# Patient Record
Sex: Female | Born: 1965
Health system: Southern US, Community
[De-identification: ages and names within clinical notes are randomized; demographics above are authoritative.]

## PROBLEM LIST (undated history)

## (undated) DIAGNOSIS — R0602 Shortness of breath: Secondary | ICD-10-CM

## (undated) DIAGNOSIS — M2550 Pain in unspecified joint: Secondary | ICD-10-CM

## (undated) DIAGNOSIS — E039 Hypothyroidism, unspecified: Secondary | ICD-10-CM

## (undated) DIAGNOSIS — R0600 Dyspnea, unspecified: Secondary | ICD-10-CM

## (undated) DIAGNOSIS — M549 Dorsalgia, unspecified: Secondary | ICD-10-CM

## (undated) DIAGNOSIS — G43909 Migraine, unspecified, not intractable, without status migrainosus: Secondary | ICD-10-CM

## (undated) DIAGNOSIS — K5900 Constipation, unspecified: Secondary | ICD-10-CM

## (undated) DIAGNOSIS — R600 Localized edema: Secondary | ICD-10-CM

## (undated) DIAGNOSIS — R002 Palpitations: Secondary | ICD-10-CM

## (undated) DIAGNOSIS — R5383 Other fatigue: Secondary | ICD-10-CM

## (undated) DIAGNOSIS — D649 Anemia, unspecified: Secondary | ICD-10-CM

## (undated) DIAGNOSIS — R609 Edema, unspecified: Secondary | ICD-10-CM

## (undated) DIAGNOSIS — R000 Tachycardia, unspecified: Secondary | ICD-10-CM

## (undated) DIAGNOSIS — E785 Hyperlipidemia, unspecified: Secondary | ICD-10-CM

## (undated) DIAGNOSIS — F32A Depression, unspecified: Secondary | ICD-10-CM

## (undated) DIAGNOSIS — K589 Irritable bowel syndrome without diarrhea: Secondary | ICD-10-CM

## (undated) DIAGNOSIS — J45909 Unspecified asthma, uncomplicated: Secondary | ICD-10-CM

## (undated) DIAGNOSIS — E559 Vitamin D deficiency, unspecified: Secondary | ICD-10-CM

## (undated) DIAGNOSIS — E538 Deficiency of other specified B group vitamins: Secondary | ICD-10-CM

## (undated) DIAGNOSIS — F329 Major depressive disorder, single episode, unspecified: Secondary | ICD-10-CM

## (undated) DIAGNOSIS — E079 Disorder of thyroid, unspecified: Secondary | ICD-10-CM

## (undated) HISTORY — PX: CARPAL TUNNEL RELEASE: SHX101

## (undated) HISTORY — DX: Edema, unspecified: R60.9

## (undated) HISTORY — DX: Dorsalgia, unspecified: M54.9

## (undated) HISTORY — DX: Shortness of breath: R06.02

## (undated) HISTORY — DX: Unspecified asthma, uncomplicated: J45.909

## (undated) HISTORY — DX: Vitamin D deficiency, unspecified: E55.9

## (undated) HISTORY — DX: Migraine, unspecified, not intractable, without status migrainosus: G43.909

## (undated) HISTORY — PX: APPENDECTOMY: SHX54

## (undated) HISTORY — DX: Other fatigue: R53.83

## (undated) HISTORY — DX: Pain in unspecified joint: M25.50

## (undated) HISTORY — DX: Hypothyroidism, unspecified: E03.9

## (undated) HISTORY — DX: Tachycardia, unspecified: R00.0

## (undated) HISTORY — DX: Constipation, unspecified: K59.00

## (undated) HISTORY — DX: Anemia, unspecified: D64.9

## (undated) HISTORY — DX: Disorder of thyroid, unspecified: E07.9

## (undated) HISTORY — DX: Dyspnea, unspecified: R06.00

## (undated) HISTORY — DX: Deficiency of other specified B group vitamins: E53.8

## (undated) HISTORY — DX: Irritable bowel syndrome, unspecified: K58.9

## (undated) HISTORY — DX: Depression, unspecified: F32.A

## (undated) HISTORY — DX: Localized edema: R60.0

## (undated) HISTORY — PX: OTHER SURGICAL HISTORY: SHX169

## (undated) HISTORY — DX: Palpitations: R00.2

## (undated) HISTORY — DX: Hyperlipidemia, unspecified: E78.5

---
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
## 1997-12-28 ENCOUNTER — Other Ambulatory Visit: Admission: RE | Admit: 1997-12-28 | Discharge: 1997-12-28 | Payer: Self-pay | Admitting: *Deleted

## 1999-02-13 ENCOUNTER — Encounter: Admission: RE | Admit: 1999-02-13 | Discharge: 1999-05-14 | Payer: Self-pay | Admitting: Family Medicine

## 1999-11-12 ENCOUNTER — Encounter: Payer: Self-pay | Admitting: *Deleted

## 1999-11-12 ENCOUNTER — Ambulatory Visit (HOSPITAL_COMMUNITY): Admission: RE | Admit: 1999-11-12 | Discharge: 1999-11-12 | Payer: Self-pay | Admitting: *Deleted

## 1999-11-28 ENCOUNTER — Ambulatory Visit (HOSPITAL_COMMUNITY): Admission: RE | Admit: 1999-11-28 | Discharge: 1999-11-28 | Payer: Self-pay | Admitting: Gastroenterology

## 1999-12-17 ENCOUNTER — Other Ambulatory Visit: Admission: RE | Admit: 1999-12-17 | Discharge: 1999-12-17 | Payer: Self-pay | Admitting: *Deleted

## 2000-01-09 ENCOUNTER — Encounter: Payer: Self-pay | Admitting: Gastroenterology

## 2000-01-09 ENCOUNTER — Ambulatory Visit (HOSPITAL_COMMUNITY): Admission: RE | Admit: 2000-01-09 | Discharge: 2000-01-09 | Payer: Self-pay | Admitting: Gastroenterology

## 2000-01-15 ENCOUNTER — Encounter (INDEPENDENT_AMBULATORY_CARE_PROVIDER_SITE_OTHER): Payer: Self-pay | Admitting: Specialist

## 2000-01-16 ENCOUNTER — Inpatient Hospital Stay (HOSPITAL_COMMUNITY): Admission: EM | Admit: 2000-01-16 | Discharge: 2000-01-21 | Payer: Self-pay | Admitting: Emergency Medicine

## 2000-01-16 ENCOUNTER — Encounter: Payer: Self-pay | Admitting: Emergency Medicine

## 2000-01-17 ENCOUNTER — Encounter: Payer: Self-pay | Admitting: Family Medicine

## 2000-10-19 ENCOUNTER — Encounter: Payer: Self-pay | Admitting: Emergency Medicine

## 2000-10-19 ENCOUNTER — Emergency Department (HOSPITAL_COMMUNITY): Admission: EM | Admit: 2000-10-19 | Discharge: 2000-10-19 | Payer: Self-pay | Admitting: Emergency Medicine

## 2000-12-21 ENCOUNTER — Other Ambulatory Visit: Admission: RE | Admit: 2000-12-21 | Discharge: 2000-12-21 | Payer: Self-pay | Admitting: *Deleted

## 2002-01-05 ENCOUNTER — Other Ambulatory Visit: Admission: RE | Admit: 2002-01-05 | Discharge: 2002-01-05 | Payer: Self-pay | Admitting: *Deleted

## 2003-03-08 ENCOUNTER — Other Ambulatory Visit: Admission: RE | Admit: 2003-03-08 | Discharge: 2003-03-08 | Payer: Self-pay | Admitting: *Deleted

## 2003-05-04 ENCOUNTER — Encounter: Admission: RE | Admit: 2003-05-04 | Discharge: 2003-05-04 | Payer: Self-pay | Admitting: Gastroenterology

## 2004-03-14 ENCOUNTER — Other Ambulatory Visit: Admission: RE | Admit: 2004-03-14 | Discharge: 2004-03-14 | Payer: Self-pay | Admitting: Obstetrics & Gynecology

## 2005-04-07 ENCOUNTER — Other Ambulatory Visit: Admission: RE | Admit: 2005-04-07 | Discharge: 2005-04-07 | Payer: Self-pay | Admitting: Obstetrics & Gynecology

## 2005-07-17 ENCOUNTER — Ambulatory Visit (HOSPITAL_COMMUNITY): Admission: RE | Admit: 2005-07-17 | Discharge: 2005-07-17 | Payer: Self-pay | Admitting: Obstetrics & Gynecology

## 2006-06-19 ENCOUNTER — Ambulatory Visit (HOSPITAL_COMMUNITY): Admission: EM | Admit: 2006-06-19 | Discharge: 2006-06-20 | Payer: Self-pay | Admitting: Emergency Medicine

## 2006-06-19 ENCOUNTER — Encounter (INDEPENDENT_AMBULATORY_CARE_PROVIDER_SITE_OTHER): Payer: Self-pay | Admitting: Specialist

## 2007-05-20 ENCOUNTER — Encounter: Admission: RE | Admit: 2007-05-20 | Discharge: 2007-05-20 | Payer: Self-pay | Admitting: Obstetrics & Gynecology

## 2009-10-29 ENCOUNTER — Encounter: Admission: RE | Admit: 2009-10-29 | Discharge: 2009-10-29 | Payer: Self-pay | Admitting: Family Medicine

## 2010-11-01 NOTE — Procedures (Signed)
[facility]  Patient:    Alexandria Ward, Alexandria Ward                          MRN: [id number] Proc. Date: 11/28/99 Adm. Date:  [id number] Disc. Date: [id number] Attending:  Orland Mustard CC:         Elana Alm. Eliezer Lofts., M.D.                           Procedure Report  PROCEDURE PERFORMED:  Esophagogastroduodenscopy.  ENDOSCOPIST:  Llana Aliment. Edwards, M.D.  MEDICATIONS:  Hurricaine spray, fentanyl 50 mcg, Versed 5 mg IV.  INDICATIONS:  Epigastric pain, abnormal upper GI suggesting gastric outlet obstruction.  DESCRIPTION OF PROCEDURE:  The procedure had been explained to the patient and consent obtained.  With the patient in the left lateral decubitus position, the Olympus video endoscope was inserted blindly in the esophagus and advanced under direct visualization.  The stomach was entered, the pylorus identified and passed.  There was virtually no evidence whatsoever of any pyloric stenosis or outlet obstructions.  The duodenum was entered.  We went to the full extent of the scope and what was felt to be the second and third duodenum with no strictures, ulcers or any other obstruction.  On passage of the scope, the duodenum appeared completely normal to the C-loop.  The duodenal bulb was completely free of any ulcerations or inflammation as was the pyloric channel. The antrum and body were seen well and were normal.  The fundus and cardia seen on the retroflex view and were normal.  There was a hiatal hernia with a patent GE junction.  The distal esophagus was somewhat reddened.  The proximal esophagus was normal.  The scope was withdrawn.  The patient tolerated the procedure without difficulty being maintained on Datascope monitor and low-flow oxygen throughout.  ASSESSMENT: 1. Hiatal hernia with gastroesophageal reflux disease.  PLAN:  Give the patient a trial of Reglan and see back in the office in four to six weeks.  She will remain on the  Prevacid.  PLAN: DD:  11/28/99 TD:  12/02/99 Job: [id number]

## 2010-11-01 NOTE — Op Note (Signed)
Alexandria Ward, Alexandria Ward                 ACCOUNT NO.:  [id number]   MEDICAL RECORD NO.:  [id number]          PATIENT TYPE:  INP   LOCATION:  6010                         FACILITY:  [facility]   PHYSICIAN:  Lebron Conners, M.D.   DATE OF BIRTH:  [birth year]   DATE OF PROCEDURE:  06/19/2006  DATE OF DISCHARGE:                               OPERATIVE REPORT   PREOPERATIVE DIAGNOSIS:  Acute appendicitis.   POSTOPERATIVE DIAGNOSIS:  Acute appendicitis.   OPERATION:  Laparoscopic appendectomy.   SURGEON:  Dr. Lebron Conners.   ANESTHESIA:  General.   BLOOD LOSS:  Minimal.   COMPLICATIONS:  No complications.   CONDITION:  To PACU good.   SPECIMEN:  Appendix sent to lab.   PROCEDURE:  After the patient was monitored and asleep and had a Foley  catheter and routine preparation and draping of the abdomen, I  infiltrated the area just below the umbilicus with long-acting local  anesthetic and then made a 2 cm vertical incision, dissected down  through scar and fat to the midline fascia and incised it for 2 cm in  the midline and bluntly entered the peritoneal cavity.  I found no  adhesions of the viscera to the umbilical area.  I placed a #0 Vicryl  pursestring suture and secured a Hassan cannula and inflated the abdomen  with CO2.  I could see an inflamed appendix.  I placed a 5 mm right  upper quadrant port and a 11-mm left lower quadrant port under direct  view noting that no viscera were injured.  I elevated the appendix and  took down adhesions and then dissected between the appendix and the  mesentery.  I divided the appendix and the mesentery with separate  firings of the endovascular cutting stapler.  There was a little  bleeding from the mesentery which I controlled with cautery and sucked  away the blood, irrigated it and sucked it away again.  I placed the  appendix in a plastic pouch and removed it through the umbilical  incision and tied the pursestring suture.  I again inspected  the  operative area and found no persistent fluid and no persist bleeding.  Sponge, needle and instrument counts were correct.  I removed the right  upper quadrant port under direct view and I saw no bleeding from the  belly wall.  After allowing the carbon dioxide to escape, I removed the  lower abdominal port and closed all skin incisions with intracuticular 4-  0 Vicryl and Steri-Strips.  She tolerated the operation well.      Lebron Conners, M.D.  Electronically Signed     WB/MEDQ  D:  06/19/2006  T:  06/20/2006  Job:  [id number]

## 2010-11-01 NOTE — Procedures (Signed)
[facility]  Patient:    Alexandria Ward, Alexandria Ward                          MRN: [id number] Proc. Date: 01/20/00 Adm. Date:  [id number] Attending:  Anastasio Auerbach CC:         Elana Alm. Eliezer Lofts., M.D.                           Procedure Report  PROCEDURE PERFORMED:  Colonoscopy and biopsy.  ENDOSCOPIST:  Llana Aliment. Randa Evens, M.D.  MEDICATIONS USED:  Fentanyl 75 mcg, Versed 6 mg IV.  INSTRUMENT:  Pediatric Olympus video colonoscope.  INDICATIONS:  Hypotension, bloating, abnormal CT suggesting possible Crohns disease.  DESCRIPTION OF PROCEDURE:  The procedure had been explained to the patient and consent obtained.  With the patient in the left lateral decubitus position, the pediatric video colonoscope was inserted and advanced under direct visualization.  Immediately upon entering the colon, it was seen to be edematous and somewhat friable.  We advanced easily to the cecum.  The terminal ileum was entered.  There appeared to be a few areas in the terminal ileum that could have been ulcerations similar to linear ulcerations.  These were biopsied.  The right colon was endoscopically normal, was biopsied, placed in jar #2.  The transverse colon and descending colon were endoscopically normal, but were biopsied and placed in jar #3.  Roughly at approximately 40 to 50 cm the friability started and this was felt to be in the sigmoid colon.  This area was biopsied from there down to the rectum and placed in jar #4.  The scope was withdrawn.  The patient tolerated the procedure well.  Maintained on low flow oxygen and pulse oximeter throughout the procedure.  ASSESSMENT: 1. Possible Crohns colitis and ileitis.  If so, very mild.  Cannot attribute    all of her symptoms to this.  It is also possible that this could be a    resolving infectious colitis.  PLAN:  Will check pathology.  Start on Asacol and low residue diet and follow clinically. DD:  01/20/00 TD:   01/21/00 Job: [id number]

## 2010-11-01 NOTE — Op Note (Signed)
NAME:  Alexandria Ward, Alexandria Ward                 ACCOUNT NO.:  [id number]   MEDICAL RECORD NO.:  [id number]          PATIENT TYPE:  AMB   LOCATION:  SDC                           FACILITY:  [facility]   PHYSICIAN:  Freddy Finner, M.D.   DATE OF BIRTH:  April 17, 1966   DATE OF PROCEDURE:  07/17/2005  DATE OF DISCHARGE:                                 OPERATIVE REPORT   PREOPERATIVE DIAGNOSES:  1.  Chronic pelvic pain.  2.  Severe dysmenorrhea.  3.  Menorrhagia.  4.  Suspected pelvic endometriosis.   POSTOPERATIVE DIAGNOSES:  1.  No evidence of pelvic endometriosis.  2.  Irregular enlargement of the uterus consistent with fibroids and      adenomyosis.  3.  Normal tubes and ovaries, normal pelvic peritoneum.  4.  No apparent abnormalities in the upper abdomen, including a normal      appendix.   OPERATIVE PROCEDURE:  1.  Laparoscopy.  2.  Uterosacral nerve ablation.  3.  Placement of Marina IUD.   INTRAOPERATIVE COMPLICATIONS:  None.   ESTIMATED INTRAOPERATIVE BLOOD LOSS:  Less than 5 mL.   ANESTHESIA:  General.   The patient is a 45 year old with clinical history as noted above, who was  admitted on the morning of surgery.  She was given an antibiotic that was  __________ preoperatively.  She was brought to the operating room and placed  under adequate general anesthesia, placed in the dorsolithotomy position  using the [place] stirrup system.  Betadine prep of abdomen, perineum, and  vagina was carried out in the usual fashion.  Bladder was evacuated with a  Robinson catheter.  Hulka tenaculum was attached to the cervix; uterus and  cervix sounded to 10 cm.  Two small incisions were made in the abdomen after  application of sterile drapes.  One was at the umbilicus and one just above  the symphysis.  Then in the upper incision , an 11 mm disposable trocar was  introduced while elevating the anterior abdominal wall.  Some difficulty was  encountered penetrating the peritoneum, but this was  accomplished without  evidence of intra-abdominal trauma.  Pneumoperitoneum was allowed to  accumulate with carbon dioxide gas.  A 5 mm trocar was placed in the lower  incision under direct visualization.  __________ probe was used for traction  during the procedure.  A systematic examination of pelvic and abdominal  contents was carried out.  Findings are essentially noted as postoperative  diagnoses.  Findings were also recorded in still photographs.  The  uterosacral ligaments were ablated at their junction with the cervix using  the bipolar Kleppinger forceps.  Gas was then allowed to escape from the  abdomen.  Instruments were removed.  The skin incisions were closed with  interrupted subcuticular sutures of 3-0 Dexon.  Marcaine 0.5% was injected  into the incision sites for postoperative analgesia.  The patient was also  given Toradol 30 mg IM for postoperative analgesia.  Hulka tenaculum was  removed; cervix was visualized, grasped with a single-tooth tenaculum.  The  Jearld Adjutant IUD was placed under standard procedure  without difficulty.  The procedure was terminated.  The instruments were  removed.  The patient was awakened and taken to the recovery room in good  condition.  She will be given routine outpatient surgical instructions.  She  has Vicodin to be taken as needed for postoperative pain.      Freddy Finner, M.D.  Electronically Signed     WRN/MEDQ  D:  07/17/2005  T:  07/17/2005  Job:  [id number]

## 2010-11-01 NOTE — H&P (Signed)
[facility]  Patient:    Alexandria Ward                           MRN: [id number] Adm. Date:  [id number] Attending:  Anastasio Auerbach                         History and Physical  DATE OF BIRTH:  1965/11/09  ADMISSION DIAGNOSIS:  Hypertension, tachycardia, unknown etiology, presumed viral syndrome, mild abdominal pain.  CHIEF COMPLAINT:  The patient complains of generalized weakness.  HISTORY OF PRESENT ILLNESS:  The patient is a 45 year old female who comes in with a several week history of some abdominal discomfort.  She is currently being worked up by Dr. Carman Ching.  She has had upper endoscopy and recently has gone through gastric-emptying scan which she does not know the results of. Today, she felt somewhat woozy and general fatigue and malaise when she went to work today.  She had several episodes of diarrhea.  She did go home and rested.  However, she continues to feel off balance when she is up and around and some mild discomfort of her abdomen.  She was put on Prevacid 30 mg twice a day in her GI workup.  She had been using some Reglan, but she stopped it about a week ago prior to her gastric emptying scan.  She did take one dose of Reglan recently.  She described some bloating feeling.  She has been hot and cold.  She states she had excessive chills today at work.  Since being brought to [facility] Emergency Room her blood pressure has fluctuated with systolic being 70 to 98, diastolic 40 to 60.  When up and walking, she feels dizzy and off balance.  She denies any headache, cough, sore throat or generalized achiness.  She denies any actual vomiting and she has no other previous medical problems to speak of.  The patient is running a fever here in the emergency room of 101.5.  PAST MEDICAL HISTORY:  No hospitalizations.  Surgeries:  She had a laparoscopy to rule out endometriosis which was ruled out by Dr. Malachy Mood.  Her menstrual cycles have  been normal.  She had a complete pelvic exam in July already this year.  ALLERGIES:  Allergic to TRIMOX causing itching.  CURRENT MEDICATIONS: 1. Prevacid 30 mg b.i.d. 2. Imipramine 25 mg two p.o. q.h.s. 3. Reglan as needed with meals, but she is off for the past week. 4. She takes Tylenol or Aleve as needed.  FAMILY HISTORY:  Noone with coronary artery disease.  Maternal aunt with hypertension.  Maternal aunt with thyroid problems.  Another aunt with breast cancer.  Maternal grandmother with colon cancer, but doing well at age 3. Fibrocystic breast disease in mother and aunt.  One of them had a breast removed, not cancerous however.  Great maternal grandmother had colon cancer and diabetes mellitus in maternal grandmother.  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  She is single, no boyfriend, no sexual activity.  No pets. No exercise at the present time.  She does not smoke.  No alcohol use.  She travels to [place] to see her family and attends [employer] there.  REVIEW OF SYSTEMS:  She complains of generalized weakness over the last few weeks.  Some nausea for the past day.  She states she has  problems with bloating, feeling quite chilled today, denies sore throat, cough or any dyspnea.  PHYSICAL EXAMINATION:  GENERAL:  Alert female in no acute distress.  VITAL SIGNS:  Blood pressure 80/50, pulse 120 to 140, respirations 20, temperature 101.5.  HEENT:  TMs are normal.  Throat is clear.  Pupils are equal, round and reactive to light.  Extraocular muscles intact bilaterally.  Funduscopic exam is normal.  NECK:  Supple without lymphadenopathy or thyromegaly.  NEUROLOGIC:  Cranial nerves II-XI are intact.  LUNGS:  Clear to auscultation with no rales or rhonchi.  HEART:  Elevated rate, but normal rhythm.  No rubs, murmurs or gallops.  ABDOMEN:  Mildly distended, but she does have bowel sounds in all  four quadrants, although they are hyperactive and high pitched.  Diffuse tenderness on palpation, but no localization of the pain.  No hepatosplenomegaly.  PELVIC/RECTAL:  Exam deferred.  Recent female exam.  EXTREMITIES:  No peripheral edema.  Sensory is intact.  LABORATORY DATA:  Initially hemoglobin 11.9, hematocrit 35, white count 10.2 with a 288,000 platelet.  Her differential shows 79 segmented neutrophils, 11 lymphocytes, 8 monocytes, but no eosinophils or basophils.  After 2.5 liters of fluid her hemoglobin dropped to 10.2, hematocrit 30.  Her electrolytes on arrival were totally normal.  Lipase of 8, calcium 8.6.  EKG:  Shows sinus tachycardia.  No evidence of ischemia.  She did have a flat plate of the abdomen.  ASSESSMENT/PLAN:  Hypotension in setting of dizziness and fever, mild diarrhea today.  Currently unknown etiology.  No previous history of heart disease. Questionable whether it is neurally mediated hypotension.  Hopefully this is just due to a virus.  She is undergoing a GI workup for the pain.  Dr. Randa Evens consult as needed.  We will follow patients H&H.  Consider cardiac workup. Will admit her fluids, observation and to follow the fever curve.  I will presume viral at this time, but this will need to be followed.  [facility] will take over care first thing in the morning. DD:  01/16/00 TD:  01/16/00 Job: [id number]

## 2010-11-01 NOTE — Discharge Summary (Signed)
[place]  Patient:    Alexandria Ward, Alexandria Ward                          MRN: [id number] Adm. Date:  [id number] Disc. Date: [id number] Attending:  Anastasio Auerbach CC:         Elana Alm. Eliezer Lofts., M.D.             _____             Llana Aliment. Randa Evens, M.D.                           Discharge Summary  DATE OF BIRTH:  06-Nov-1965  CONSULTATIONS:  Dr. Vilinda Boehringer.  PROCEDURES:  Colonoscopy with biopsy.  DISCHARGE DIAGNOSES: 1. Colitis, nonspecific, biopsies pending. 2. Persistent orthostatic hypotension, adrenal insufficiency workup in    progress. 3. History of laparoscopy to rule out endometriosis by Dr. Malachy Mood. 4. History of allergy to TRIMOX (pleuritis).  DISCHARGE MEDICATIONS: 1. Prevacid 30 mg p.o. b.i.d. 2. Imipramine 25 mg q.h.s. p.r.n. 3. Asacol 400 mg 2 p.o. t.i.d.  FOLLOW-UP:  The patient is scheduled with Dr. Elias Else on Tuesday, January 28, 2000, at 12 noon.  She is to call Dr. Randa Evens office at [phone] to schedule follow-up and to obtain results of pending biopsies.  HISTORY OF PRESENT ILLNESS:  The patient is a 44 year old woman who has had several weeks of abdominal discomfort, under outpatient evaluation by Dr. Vilinda Boehringer.  She had undergone an upper endoscopy with nonspecific findings, as well as a gastric emptying scan which showed mild delay.  Laboratory workup had been unrevealing including LFTs, CBC, and TSH.  There had been no improvement on Reglan.  On the day of admission, the patient had felt lightheaded, experiencing presyncopal symptoms and, on evaluation, was found to be hypotensive.  Ms. Luellen has suffered for some time from generalized fatigue.  She is frustrated with her lack of energy, which she said has been chronic and progressive.  She has also noted eyelid puffiness, and her weight has fluctuated over the previous year.  She had been experiencing diarrhea in the preceding weeks as well as some poorly localized  discomfort of the abdomen. On the day of admission, she had experienced chills.  She had denied headache, cough, sore throat, myalgias, and arthralgias.  Physical exam in the emergency room revealed temperature 101.5, systolic pressures between 70 and 98, with diastolics between 40 and 60.  Pulse elevated between 120 and 140s and tachycardia.  Respirations were normal, as was oxygen saturation.  Physical exam as performed by Dr. Merri Brunette revealed no specific abnormalities. Admission laboratories included hemoglobin 11.9, white count 10.2, platelets 288,000.  Follow-up hemoglobin after hydration was 10.2.  Lipase was normal, as was calcium.  EKG showed sinus tachycardia.  Ms. Egelston was admitted with marked orthostatic hypotension and abdominal symptoms with fever, suspicious for viral syndrome.  HOSPITAL COURSE: #1 - COLITIS:  Ms. Viviano was placed empirically on Reglan and proton pump inhibitors, neither of which seemed to make much difference in her symptoms. She underwent colonoscopy on January 20, 2000, which revealed some inflammatory changes of the colon in a pattern not characteristic of her particular etiology, but with differential including Crohns disease versus infection. Pathology is pending at this time, and Ms. Blaszczyk was begun on Asacol and a low-residue diet.  She will follow up with Dr. Randa Evens.  At the  end of her hospitalization she was experiencing minimal symptoms with regard to her gastrointestinal complaints.  Stool cultures negative for pathogens.  C. difficile toxin negative.  #2 - PERSISTENT HYPOTENSION:  Mrs. Brabender was initially suspected to be suffering from hypovolemia, given her recent history of diarrhea.  She was aggressively rehydrated with initial improvement in her blood pressure.  She also experienced improvement in her pulse, but remained orthostatic and somewhat hypotensive for the remainder of her admission, although no longer symptomatic.   Screening tests included normal TSH, a.m. cortisol level in the low-normal range at 8.5, and negative urine pregnancy.  Urinalysis was unremarkable, and a specific gravity was curiously 1.009, suggesting possible inability to concentrate.  The day prior to admission, she underwent a cosyntropin skin test to rule out adrenal insufficiency.  She has experienced no hypoglycemia, and menstrual periods have been regular.  Monitoring of fluid intake and output revealed no evidence of diabetes insipidus.  While it is possible her hypotension is secondary to a viral syndrome, given the chronicity of her symptoms, it is important to rule out adrenal insufficiency.  These tests will need to be followed up at office appointment next week, at which time orthostatic blood pressure and pulse should also be repeated. DD:  01/21/00 TD:  01/22/00 Job: [id number]

## 2010-11-01 NOTE — Consult Note (Signed)
[facility]  Patient:    Alexandria Ward                           MRN: [id number] Proc. Date: 01/16/00 Adm. Date:  [id number] Attending:  Anastasio Auerbach CC:         Elana Alm. Eliezer Lofts., M.D.   Consultation Report  HISTORY:  A nice 45 year old woman who has had vague upper GI symptoms.  She had bloating and had an abnormal upper GI suggesting delayed drainage of the stomach without clear outlet obstruction.  She had been on Prevacid 30 mg b.i.d. without improvement.  We saw her for these symptoms.  Labs were obtained revealing TSH that was normal as well as completely normal liver tests and CBC.  Her workup to date has included an endoscopy which showed hiatal hernia with possible reflux and we started her on Reglan; this really did not help her.  She has continued to bloat.  She had been chronically constipated and we gave her some Miralax.  She was due to see me back in the office in one month.  We did go ahead and obtain a gastric emptying scan last week off of Reglan.  It did show slight delay in gastric emptying, with 68% of material remaining at one hour, normal less than 50%.  The patient felt woozy, felt like she was going to pass out fairly suddenly today and was admitted during the night to the hospital.  She is not feeling any better.  She presented to the emergency room with systolic pressures in the 70s and felt dizzy.  In addition to this, she continued to have a fever and gave a history of a temperature of 101.  MEDICATIONS ON ADMISSION 1. Prevacid 30 mg b.i.d. 2. Reglan 10 mg a.c. and h.s. 3. Tylenol. 4. Imipramine 25 mg q.h.s. p.r.n. for sleep.  ALLERGIES:  She is allergic to PENICILLIN.  MEDICAL HISTORY 1. Endoscopy, gastric emptying scan and upper GI, all as noted above. 2. Questionable history of IBS. 3. History of chronic yeast infections. 4. History of previous laparoscopy.  FAMILY HISTORY:  Unchanged.  PHYSICAL  EXAMINATION  VITAL SIGNS:  Temperature 97.5, blood pressure 86/55.  GENERAL:  A nonicteric white female in no acute distress.  LUNGS:  Clear.  HEART:  Regular rate and rhythm, without murmurs or gallops.  ABDOMEN:  Slightly distended but generally is soft with normal bowel sounds. There is mild tenderness in the lower abdomen but no guarding, rigidity or rebound.  RECTAL:  Not repeated.  Stool was checked in the office and was Hemoccult negative.  LABORATORY DATA:  UA is negative for wbcs or rbcs, does have ketones. Hemoglobin 10.2, with normal BUN and creatinine.  Also notable, gallbladder ultrasound obtained today revealed no evidence of gallstones.  ASSESSMENT:  Persistent nausea and fever of unclear etiology.  This patient may well have some sort of autoimmune disease of some sort.  I think we probably need to go ahead and look at her abdomen, since this seems to be the source of her symptoms.  At this point, she has had a negative endoscopy, gastric emptying scan and has had negative gallbladder ultrasound.  I think a CT would be the next appropriate step.  PLAN:  We will go ahead and obtain a CT of the abdomen with contrast to look at her pancreas and other organs.  Patient notes to me that she is not sexually active  and there is no chance she could be pregnant. DD:  01/16/00 TD:  01/17/00 Job: [id number]

## 2011-02-24 IMAGING — CT CT ABD-PELV W/O CM
2 of 4 series · 17 of 46 positions shown, 19 images · non-contrast
Comparison: None

CLINICAL DATA: Bladder and pelvic pain.  Urinary frequency and
urgency.  Elevated creatinine.

CT ABDOMEN AND PELVIS WITHOUT CONTRAST
TECHNIQUE: Multidetector CT imaging of the abdomen and pelvis was
performed following the standard protocol without intravenous
contrast.

[Series 2: wo · axial · 0.68mm/px · z∈[+540,+960]mm · 14 of 92 slices shown, 16 images]
[im 4/92  soft-tissue]
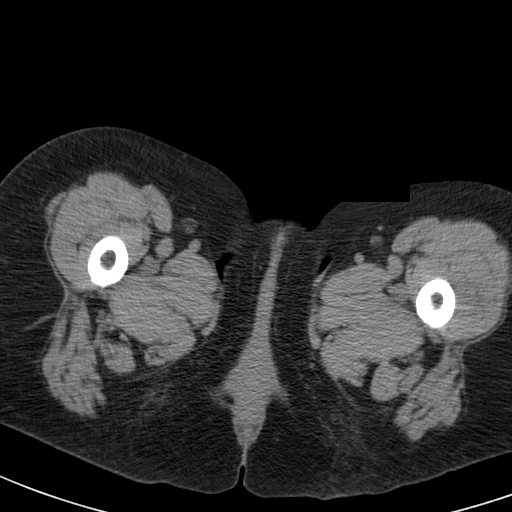
[im 4/92  bone]
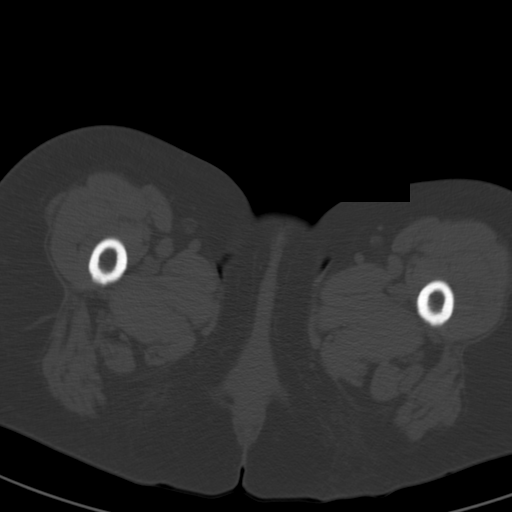
[im 11/92  soft-tissue]
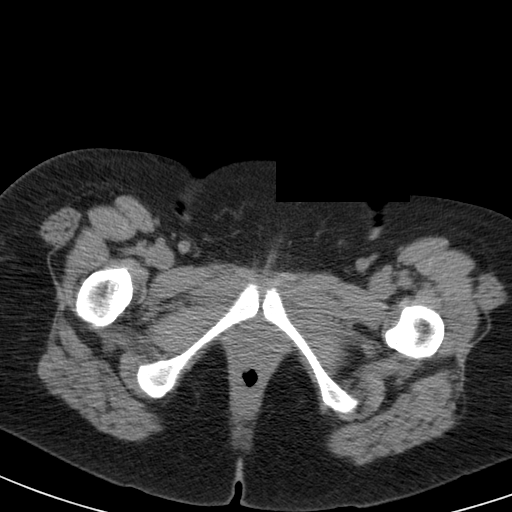
[im 19/92  soft-tissue]
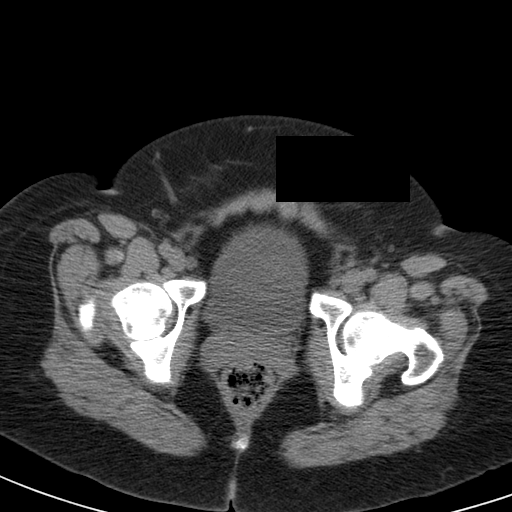
[im 26/92  soft-tissue]
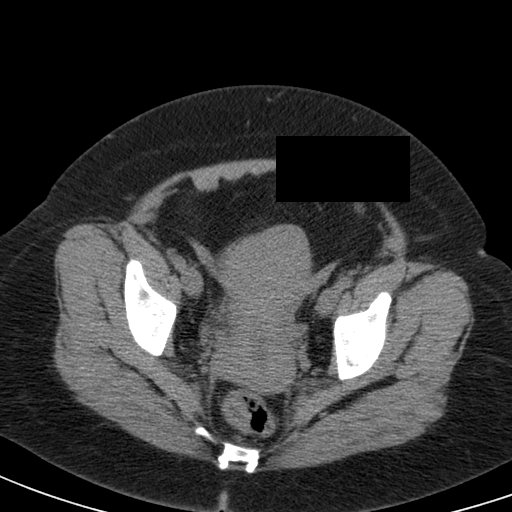
[im 30/92  soft-tissue]
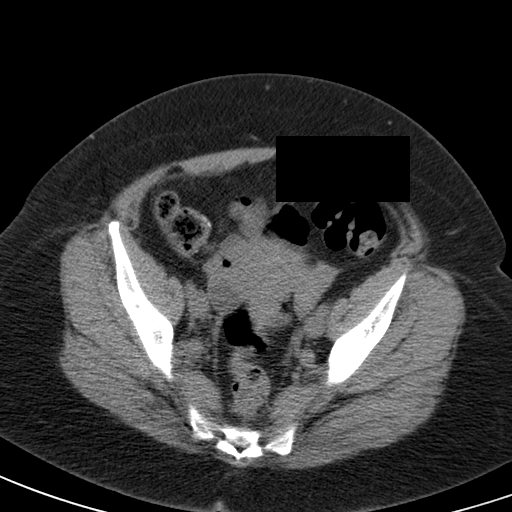
[im 37/92  soft-tissue]
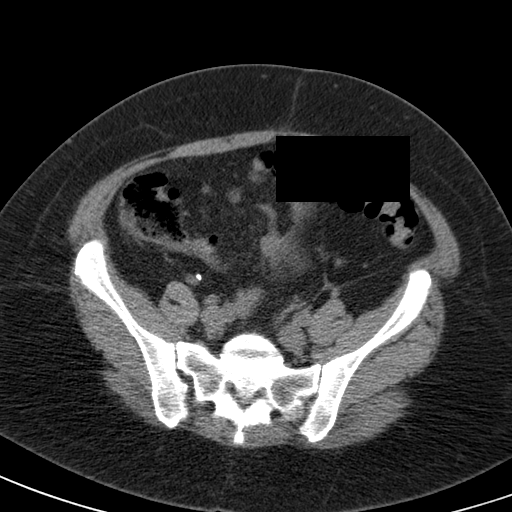
[im 44/92  soft-tissue]
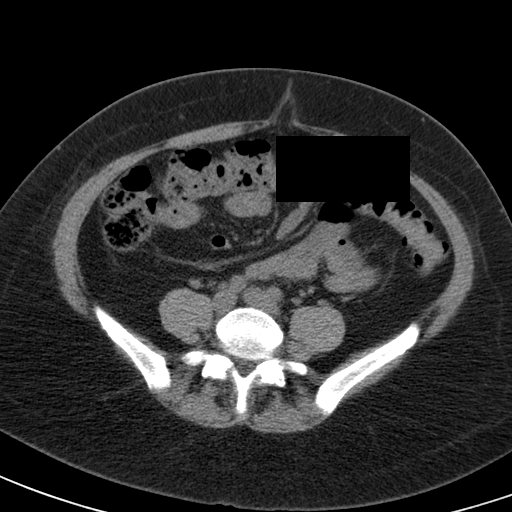
[im 48/92  soft-tissue]
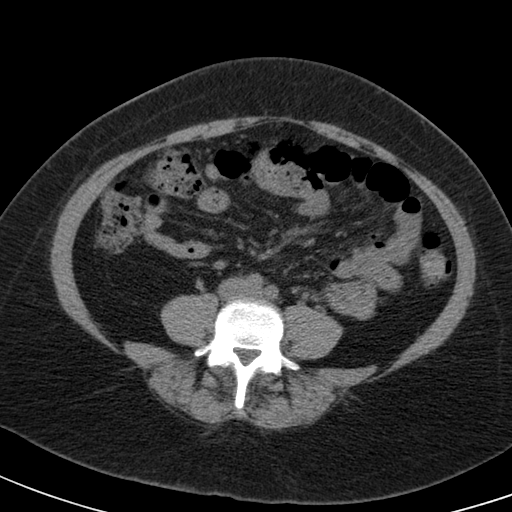
[im 55/92  soft-tissue]
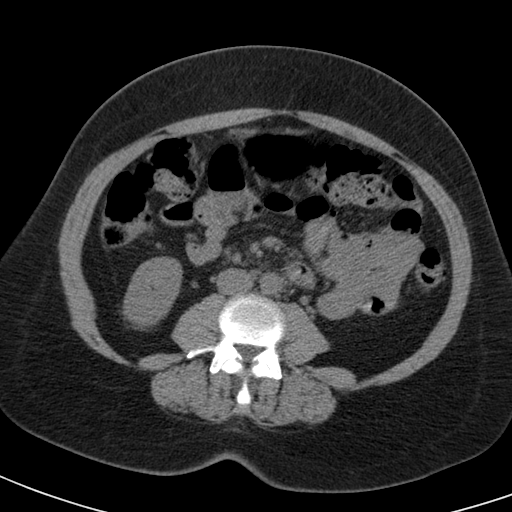
[im 55/92  bone]
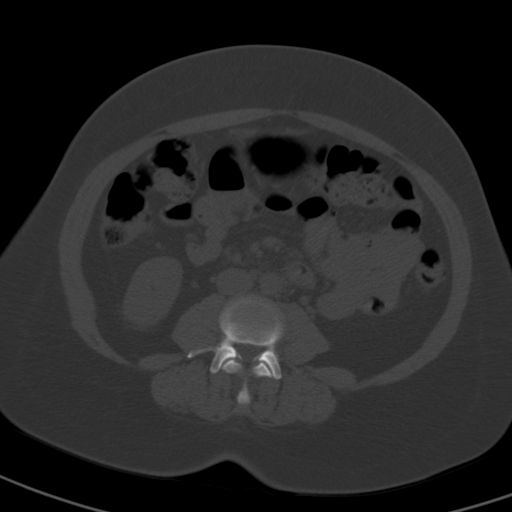
[im 62/92  soft-tissue]
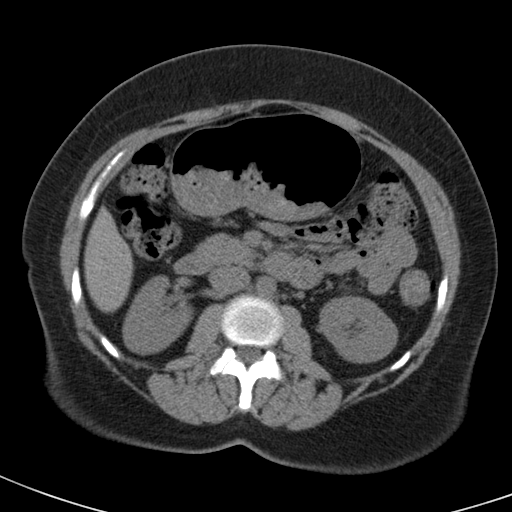
[im 70/92  soft-tissue]
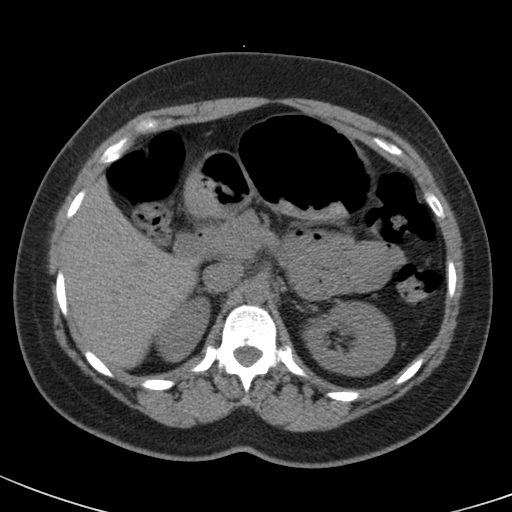
[im 73/92  soft-tissue]
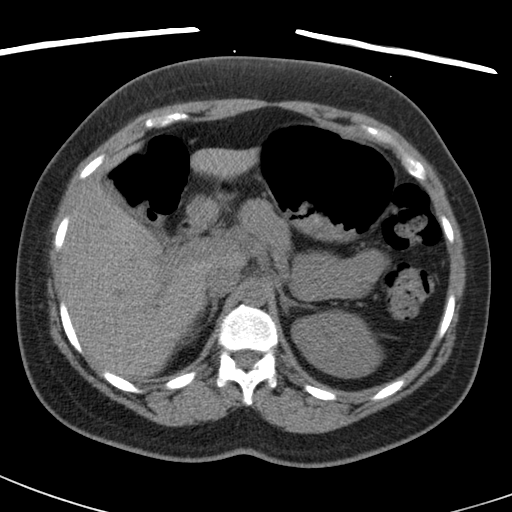
[im 81/92  soft-tissue]
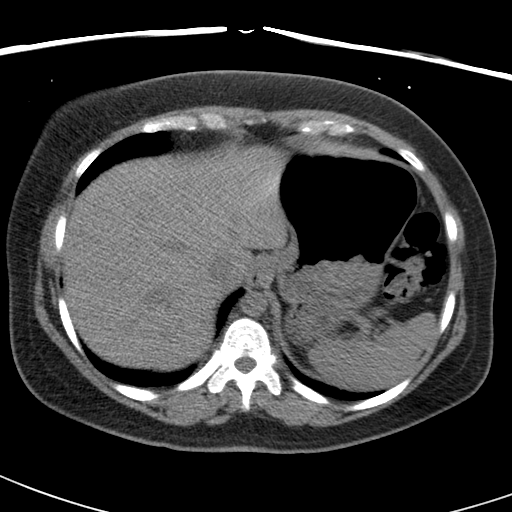
[im 88/92  soft-tissue]
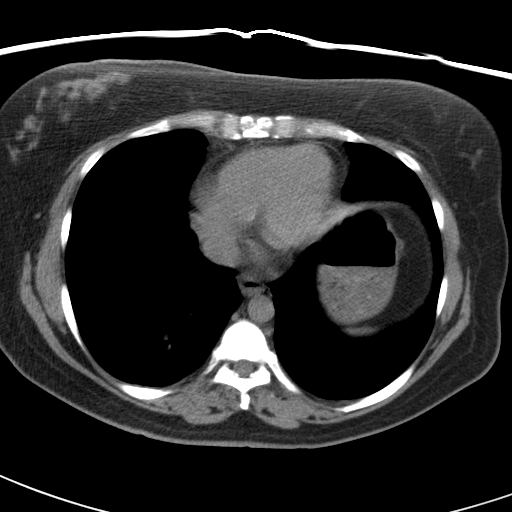

[coronals · coronal · 0.89mm/px · 3 of 89 slices shown]
[im 30/89  soft-tissue]
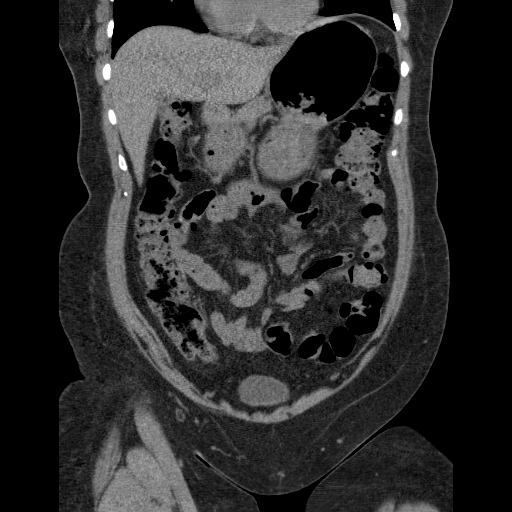
[im 40/89  soft-tissue]
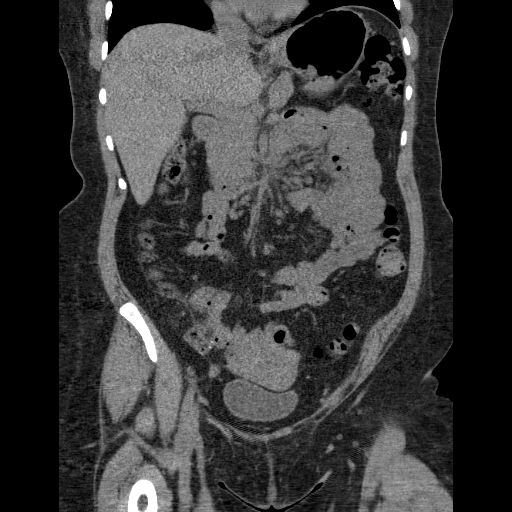
[im 49/89  soft-tissue]
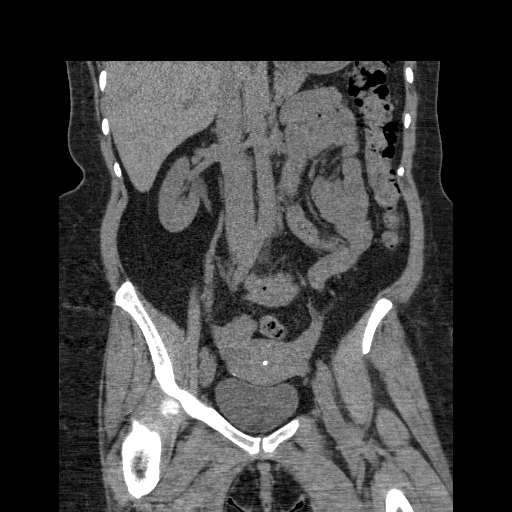

[17 of 46 positions shown; findings below may reference images not displayed]

FINDINGS: Visualized lung bases are clear.

Liver is normal.

Spleen is normal.

Pancreas is normal.

The adrenal glands are normal.

 The right kidney appears normal.  No right renal calculi or
evidence of right-sided hydronephrosis.

There is a left-sided pelvocaliectasis involving the interpolar
region and inferior pole.  The left ureter has a normal caliber.

No obstructing calculus identified.

Gallbladder is unremarkable by CT.

No biliary ductal dilation.

The stomach and the small bowel loops have a normal course and
caliber.  No specific features are noted to suggest small bowel
obstruction.

There are postoperative changes within the cecum.  The colon has a
normal caliber.  There is gas and stool seen throughout the colon.
No large bowel wall thickening identified.

Abdominal aorta normal is in caliber.

No significant lymphadenopathy.

No free fluid or abnormal fluid collections.

Urinary bladder is normal.  No bladder calculi identified.
IMPRESSION: 1.  There are no obstructing renal calculi identified.
2.  There is pelvocaliectasis/mild hydronephrosis involving the
interpolar region and inferior pole of the left kidney.  This may
reflect the sequela of recently passed renal calculus.  Urinary
tract infection could conceivably have a similar appearance.  If
there is a history of hematuria then this may be a presenting sign
of underlying urothelial tumor.

## 2012-01-27 ENCOUNTER — Other Ambulatory Visit: Payer: Self-pay | Admitting: Obstetrics & Gynecology

## 2013-12-28 DIAGNOSIS — Z9889 Other specified postprocedural states: Secondary | ICD-10-CM | POA: Insufficient documentation

## 2013-12-28 DIAGNOSIS — H2513 Age-related nuclear cataract, bilateral: Secondary | ICD-10-CM | POA: Insufficient documentation

## 2013-12-28 DIAGNOSIS — H02889 Meibomian gland dysfunction of unspecified eye, unspecified eyelid: Secondary | ICD-10-CM | POA: Insufficient documentation

## 2014-09-01 DIAGNOSIS — H04123 Dry eye syndrome of bilateral lacrimal glands: Secondary | ICD-10-CM | POA: Insufficient documentation

## 2017-03-13 LAB — HM COLONOSCOPY

## 2017-08-31 ENCOUNTER — Ambulatory Visit (INDEPENDENT_AMBULATORY_CARE_PROVIDER_SITE_OTHER): Payer: Managed Care, Other (non HMO) | Admitting: Sports Medicine

## 2017-08-31 ENCOUNTER — Encounter: Payer: Self-pay | Admitting: Sports Medicine

## 2017-08-31 VITALS — BP 112/80 | Ht 61.0 in | Wt 175.0 lb

## 2017-08-31 DIAGNOSIS — M7711 Lateral epicondylitis, right elbow: Secondary | ICD-10-CM | POA: Diagnosis not present

## 2017-08-31 NOTE — Progress Notes (Signed)
   Subjective   Patient ID: Alexandria Ward    DOB: 11/07/1965, 52 y.o. female   MRN: [id number]  CC: "Right elbow pain"  HPI: Alexandria Ward is a 52 y.o. female who presents to clinic today for the following:  Right elbow pain: Onset 2 months with gradual worsening of right elbow pain particularly on lateral surface.  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  She denies prior history of similar pain.  She denies any involvement of her left arm.  She is right-handed.  She has tried a topical cream which was given to her for a historical injury to of her left arm without improvement.  She denies any fevers or chills, loss of motor function.  He does endorse some paresthesia but is unable to specify to which area is affected.  Patient states most of her pain occurs when she is lying on her right side.  ROS: see HPI for pertinent.  PMFSH: Hypothyroidism, vitamin D deficiency, allergies.  Surgical history unremarkable.  Family history unremarkable. Smoking status reviewed. Medications reviewed.  Objective   BP 112/80   Ht 5\' 1"  (1.549 m)   Wt 175 lb (79.4 kg)   BMI 33.07 kg/m  Vitals and nursing note reviewed.  General: well nourished, well developed, NAD with non-toxic appearance HEENT: normocephalic, atraumatic, moist mucous membranes Cardiovascular: 2+ radial pulse bilaterally Lungs: normal work of breathing Skin: warm, dry, no rashes or lesions, cap refill < 2 seconds MSK: no deformities or atrophy on right arm, active and passive range of motion intact, tenderness to lateral epicondyle worse with extension, no edema to elbow joint, neurovascular intact without paresthesia at present  Assessment & Plan   Right lateral epicondylitis Acute.  Uncertain etiology.  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  Does have a small tear based on ultrasound without effusion.  Does affect dominant side. - Advised patient to use at home exercises and avoid straining right arm -  Conservative therapy including icing and banding - RTC on an as-needed basis  No orders of the defined types were placed in this encounter.  No orders of the defined types were placed in this encounter.   Durward Parcelavid Macauley Mossberg, DO [facility], PGY-2 08/31/2017, 12:07 PM   Patient seen and evaluated with the resident. I agree with the above plan of care. Ultrasound today shows a small spur and a small tear at the lateral epicondyle. Findings consistent with lateral epicondylitis. Patient will try a body helix compression sleeve and will start a home exercise program. If symptoms persist or worsen then consider formal physical therapy. Follow-up for ongoing or recalcitrant issues.

## 2017-08-31 NOTE — Assessment & Plan Note (Signed)
Acute.  Uncertain etiology.  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  Does have a small tear based on ultrasound without effusion.  Does affect dominant side. - Advised patient to use at home exercises and avoid straining right arm - Conservative therapy including icing and banding - RTC on an as-needed basis

## 2017-08-31 NOTE — Patient Instructions (Signed)
Thank you for coming in to see Alexandria Ward today. Please see below to review our plan for today's visit.  Your right elbow pain is from lateral epicondylitis.  Below is some information on this.  This is commonly caused by repetitive use of extension of the wrist.  Be sure to avoid lifting heavy objects or pushing.  It is important that you still remain active with your right arm.  The ultrasound we did showed a small tear which is not uncommon for this problem.  We will treat this with conservative management primarily with rest and physical therapy.  Do know that this can take up to 6-12 months to resolve.  Please call the clinic at [phone] if your symptoms worsen or you have any concerns. It was our pleasure to serve you.  Durward Parcelavid Isa Kohlenberg, DO [facility], PGY-2   Tennis Elbow Tennis elbow is puffiness (inflammation) of the outer tendons of your forearm close to your elbow. Your tendons attach your muscles to your bones. Tennis elbow can happen in any sport or job in which you use your elbow too much. It is caused by doing the same motion over and over. Tennis elbow can cause:  Pain and tenderness in your forearm and the outer part of your elbow.  A burning feeling. This runs from your elbow through your arm.  Weak grip in your hands.  Follow these instructions at home: Activity  Rest your elbow and wrist as told by your doctor. Try to avoid any activities that caused the problem until your doctor says that you can do them again.  If a physical therapist teaches you exercises, do all of them as told.  If you lift an object, lift it with your palm facing up. This is easier on your elbow. Lifestyle  If your tennis elbow is caused by sports, check your equipment and make sure that: ? You are using it correctly. ? It fits you well.  If your tennis elbow is caused by work, take breaks often, if you are able. Talk with your manager about doing your work in a way that is safe  for you. ? If your tennis elbow is caused by computer use, talk with your manager about any changes that can be made to your work setup. General instructions  If told, apply ice to the painful area: ? Put ice in a plastic bag. ? Place a towel between your skin and the bag. ? Leave the ice on for 20 minutes, 2-3 times per day.  Take medicines only as told by your doctor.  If you were given a brace, wear it as told by your doctor.  Keep all follow-up visits as told by your doctor. This is important. Contact a doctor if:  Your pain does not get better with treatment.  Your pain gets worse.  You have weakness in your forearm, hand, or fingers.  You cannot feel your forearm, hand, or fingers. This information is not intended to replace advice given to you by your health care provider. Make sure you discuss any questions you have with your health care provider. Document Released: 11/20/2009 Document Revised: 01/31/2016 Document Reviewed: 05/29/2014 Elsevier Interactive Patient Education  [employer]2018 [employer]

## 2018-06-04 ENCOUNTER — Other Ambulatory Visit: Payer: Self-pay | Admitting: Podiatry

## 2018-06-04 ENCOUNTER — Ambulatory Visit (INDEPENDENT_AMBULATORY_CARE_PROVIDER_SITE_OTHER): Payer: Managed Care, Other (non HMO) | Admitting: Podiatry

## 2018-06-04 ENCOUNTER — Ambulatory Visit: Payer: Managed Care, Other (non HMO)

## 2018-06-04 ENCOUNTER — Encounter: Payer: Self-pay | Admitting: Podiatry

## 2018-06-04 VITALS — BP 106/71 | HR 103

## 2018-06-04 DIAGNOSIS — M779 Enthesopathy, unspecified: Principal | ICD-10-CM

## 2018-06-04 DIAGNOSIS — M778 Other enthesopathies, not elsewhere classified: Secondary | ICD-10-CM

## 2018-06-04 DIAGNOSIS — B070 Plantar wart: Secondary | ICD-10-CM | POA: Diagnosis not present

## 2018-06-04 DIAGNOSIS — M79671 Pain in right foot: Secondary | ICD-10-CM | POA: Diagnosis not present

## 2018-06-05 NOTE — Progress Notes (Signed)
Subjective:   Patient ID: Alexandria Ward, female   DOB: 52 y.o.   MRN: [id number]   HPI Patient presents with a lesion underneath the right foot which is been very sore and making it hard to walk on.  Does not remember trauma or injury and has been present for about 3 months and she is tried over the counter medicine without success.  Patient does not smoke and likes to be active   Review of Systems  All other systems reviewed and are negative.       Objective:  Physical Exam Vitals signs and nursing note reviewed.  Constitutional:      Appearance: She is well-developed.  Pulmonary:     Effort: Pulmonary effort is normal.  Musculoskeletal: Normal range of motion.  Skin:    General: Skin is warm.  Neurological:     Mental Status: She is alert.     Neurovascular status intact muscle strength is adequate range of motion within normal limits with patient found to have a lesion underneath the right foot that upon palpation is painful with pinpoint bleeding and no indication of spread from this particular area.  It measures approximately 6 x 6 mm and patient was noted to have good digital perfusion     Assessment:  Verruca plantaris plantar aspect right most likely with possibility of foreign body or other type of unknown lesion     Plan:  H&P and educated her on condition.  Today I did sterile deep debridement of the lesion and then I applied medication to create immune response and applied sterile dressing and instructed what to do if immune response were to occur.  This may require biopsy if it were to persist and she will be checked back in 1 month

## 2018-07-02 ENCOUNTER — Ambulatory Visit: Payer: Managed Care, Other (non HMO) | Admitting: Podiatry

## 2018-07-29 ENCOUNTER — Ambulatory Visit: Payer: Managed Care, Other (non HMO) | Admitting: Podiatry

## 2018-08-09 ENCOUNTER — Ambulatory Visit: Payer: Managed Care, Other (non HMO)

## 2018-08-09 ENCOUNTER — Ambulatory Visit (INDEPENDENT_AMBULATORY_CARE_PROVIDER_SITE_OTHER): Payer: Managed Care, Other (non HMO) | Admitting: Cardiology

## 2018-08-09 ENCOUNTER — Encounter: Payer: Self-pay | Admitting: Cardiology

## 2018-08-09 VITALS — BP 116/70 | HR 97 | Ht 61.0 in | Wt 209.9 lb

## 2018-08-09 DIAGNOSIS — R000 Tachycardia, unspecified: Secondary | ICD-10-CM

## 2018-08-09 DIAGNOSIS — R002 Palpitations: Secondary | ICD-10-CM | POA: Diagnosis not present

## 2018-08-09 DIAGNOSIS — R0609 Other forms of dyspnea: Secondary | ICD-10-CM | POA: Insufficient documentation

## 2018-08-09 DIAGNOSIS — E039 Hypothyroidism, unspecified: Secondary | ICD-10-CM | POA: Diagnosis not present

## 2018-08-09 DIAGNOSIS — R0600 Dyspnea, unspecified: Secondary | ICD-10-CM

## 2018-08-09 NOTE — Progress Notes (Signed)
Subjective:   Alexandria Ward, female    DOB: 1966/03/24, 53 y.o.   MRN: [id number]  Pearson Grippe, MD:  Chief Complaint  Patient presents with  . Tachycardia    NP Eval     HPI: Alexandria Ward  is a 53 y.o. female  with history of migranes and hypothyroidism referred to [place] by Lauretta Chester, NP for evaluation of tachycardia, fatigue, and new exercise intolerance.  Reports being diagnosed with hypothyroidism last year; however, although levels are now stable, she has not had any improvement in her symptoms. Continues to feel fatigued and short of breath. Admits to dyspnea on minimal exertion and feels like she gets hot. States can happen with just walking into her office. Denies any chest pain. Does not exercise in view of her symptoms. She has been told told to be going through menopause. She reports that she has gained 50 lbs over the last 1 year.   She also has complaints of intermittent heart racing that can occur even while relaxing. Generally last for a few minutes. Sudden onset and gradual resolve. Has also been told to be tachycardic in doctors offices. She does not monitor her pulse during these episodes.  Migranes are well controlled with amitryptiline. No history of hyperlipidemia or hypertension.  Maternal grandfather had CAD and father had CAD with a few MI's (unsure of age). No former tobacco use.    Past Medical History:  Diagnosis Date  . Fatigue   . Shortness of breath   . Tachycardia   . Thyroid disease     Past Surgical History:  Procedure Laterality Date  . APPENDECTOMY    . endometrial ablasion      Family History  Problem Relation Age of Onset  . Restless legs syndrome Mother   . Arthritis Mother   . Neuropathy Mother   . Hyperlipidemia Mother   . Heart attack Father     Social History   Socioeconomic History  . Marital status: Single    Spouse name: Not on file  . Number of children: Not on file  . Years of education: Not on file  . Highest  education level: Not on file  Occupational History  . Not on file  Social Needs  . Financial resource strain: Not on file  . Food insecurity:    Worry: Not on file    Inability: Not on file  . Transportation needs:    Medical: Not on file    Non-medical: Not on file  Tobacco Use  . Smoking status: Never Smoker  . Smokeless tobacco: Never Used  Substance and Sexual Activity  . Alcohol use: Yes    Comment: occasional   . Drug use: Never  . Sexual activity: Not on file  Lifestyle  . Physical activity:    Days per week: Not on file    Minutes per session: Not on file  . Stress: Not on file  Relationships  . Social connections:    Talks on phone: Not on file    Gets together: Not on file    Attends religious service: Not on file    Active member of club or organization: Not on file    Attends meetings of clubs or organizations: Not on file    Relationship status: Not on file  . Intimate partner violence:    Fear of current or ex partner: Not on file    Emotionally abused: Not on file    Physically  abused: Not on file    Forced sexual activity: Not on file  Other Topics Concern  . Not on file  Social History Narrative  . Not on file    Current Meds  Medication Sig  . amitriptyline (ELAVIL) 25 MG tablet Take 25 mg by mouth at bedtime.  . Carboxymethylcellulose Sod PF (REFRESH CELLUVISC) 1 % GEL Place 1 application into both eyes as needed.  . Cholecalciferol (VITAMIN D-3) 5000 units TABS Take 5,000 Units by mouth.  . desonide (DESOWEN) 0.05 % cream Apply to eyelids BID PRN for itching  . fexofenadine (KLS ALLER-FEX) 180 MG tablet Take 180 mg by mouth daily.  . fluconazole (DIFLUCAN) 150 MG tablet Take 1 tablet by mouth as needed.  . Fluocinonide 0.1 % CREA 1 application by Other route as needed.  . fluticasone (FLONASE) 50 MCG/ACT nasal spray Place into the nose.  . hypromellose (GENTEAL) 0.3 % GEL ophthalmic ointment Apply 1 drop to eye as needed.  Marland Kitchen levothyroxine  (SYNTHROID, LEVOTHROID) 50 MCG tablet Take 50 mcg by mouth daily before breakfast.  . MAGNESIUM MALATE PO Take 1 tablet by mouth daily.  . Multiple Vitamins-Minerals (WOMENS DAILY FORMULA PO) Take 1 tablet by mouth daily.  . Nutritional Supplements (ADULT NUTRITIONAL SUPPLEMENT + PO) Take 1 tablet by mouth daily. Nuclezyme-Forte  . zolmitriptan (ZOMIG) 5 MG tablet Take 5 mg by mouth as needed for migraine.     Review of Systems  Constitution: Positive for malaise/fatigue and weight gain. Negative for decreased appetite and weight loss.  Eyes: Negative for visual disturbance.  Cardiovascular: Positive for dyspnea on exertion and palpitations. Negative for chest pain, claudication, leg swelling, orthopnea and syncope.  Respiratory: Negative for hemoptysis and wheezing.   Endocrine: Negative for cold intolerance and heat intolerance.  Hematologic/Lymphatic: Does not bruise/bleed easily.  Skin: Negative for nail changes.  Musculoskeletal: Negative for muscle weakness and myalgias.  Gastrointestinal: Negative for abdominal pain, change in bowel habit, nausea and vomiting.  Neurological: Negative for difficulty with concentration, dizziness, focal weakness and headaches.  Psychiatric/Behavioral: Negative for altered mental status and suicidal ideas.  All other systems reviewed and are negative.      Objective:     Blood pressure 116/70, pulse 97, height 5\' 1"  (1.549 m), weight 209 lb 14.4 oz (95.2 kg), SpO2 97 %.   Physical Exam  Constitutional: She is oriented to person, place, and time. Vital signs are normal. She appears well-developed and well-nourished.  HENT:  Head: Normocephalic and atraumatic.  Neck: Normal range of motion.  Cardiovascular: Regular rhythm, normal heart sounds and intact distal pulses. Tachycardia present.  Pulses:      Dorsalis pedis pulses are 2+ on the right side and 2+ on the left side.       Posterior tibial pulses are 2+ on the right side and 2+ on the  left side.  Pulmonary/Chest: Effort normal and breath sounds normal. No accessory muscle usage. No respiratory distress.  Abdominal: Soft. Bowel sounds are normal.  Musculoskeletal: Normal range of motion.        General: Edema (bilateral lower extremity 1+) present.  Neurological: She is alert and oriented to person, place, and time.  Skin: Skin is warm and dry.  Vitals reviewed.   EKG 08/09/2018: Borderline sinus tachycardia at 92 bpm, left atrial enlargement, normal axis, early R wave progression, diffuse nonspecific T wave flattening.       Assessment & Recommendations:   1. Tachycardia Patient has newly noted borderline resting tachycardia despite  having normalized thyroid levels. She is also noted to have left atrial enlargement on EKG. Will obtain echocardiogarm for further evaluation. Could consider beta blocker therapy depending upon results such as Propanolol in view of her migranes.  - EKG 12-Lead  2. Palpitations Has intermittent episodes of heart racing. Will place her on 30 day event monitor for further evaluation.  - Cardiac event monitor; Future  3. Dyspnea on exertion Over the last few months with minimal exertion. As stated above will obtain echocardiogram. Although have low suspicion for CAD, will obtain routine treadmill stress test to evaluate her exercise capacity and also her tachycardia. Suspect may be multifactoral from also deconditioning and weight gain. - PCV ECHOCARDIOGRAM COMPLETE; Future - PCV CARDIAC STRESS TEST; Future  4. Hypothyroidism (acquired) Now within range and being managed by PCP.   She will follow up after testing for further recommendations.   *I have discussed this case with Dr. Rosemary Holms and he personally examined the patient and participated in formulating the plan.     [place], FNP-C [facility] Office: [phone] Fax: [phone]

## 2018-08-18 ENCOUNTER — Ambulatory Visit: Payer: Managed Care, Other (non HMO) | Admitting: Cardiology

## 2018-08-18 ENCOUNTER — Ambulatory Visit: Payer: Managed Care, Other (non HMO)

## 2018-08-18 DIAGNOSIS — R0609 Other forms of dyspnea: Principal | ICD-10-CM

## 2018-08-18 DIAGNOSIS — R0600 Dyspnea, unspecified: Secondary | ICD-10-CM

## 2018-08-23 ENCOUNTER — Telehealth: Payer: Self-pay

## 2018-08-23 NOTE — Progress Notes (Signed)
Subjective:   Alexandria Ward, female    DOB: June 14, 1966, 53 y.o.   MRN: [id number]  Pearson Grippe, MD:  Chief Complaint  Patient presents with  . Tachycardia    F/U for gxt and echo results    HPI: Alexandria Ward  is a 53 y.o. female  with history of migranes and hypothyroidism recently referred to [place] by Lauretta Chester, NP for evaluation of tachycardia, fatigue, and new exercise intolerance.  Patient made sooner appt to see me today for concerns regarding fluid retention. She is still wearing a event monitor. Treadmill stress testing was performed on 08/18/2018 that revealed low exercise capacity, but no EKG changes concerning for ischemia. Echocardiogram was also performed at that time that revealed grade 1 diastolic dysfunction, otherwise, normal echo.  She continues to have dyspnea with minimal exertion, fatigue, intermittent heart racing, and feels that she is swelling everywhere. She is very concerned about her symptoms and states that she is miserable and just wants to find out what is wrong.   Reports being diagnosed with hypothyroidism last year; however, although levels are now stable, she has not had any improvement in her symptoms. She reports that she has gained 50 lbs over the last 1 year.   Migranes are well controlled with amitryptiline. No history of hyperlipidemia or hypertension.  Maternal grandfather had CAD and father had CAD with a few MI's (unsure of age). No former tobacco use.    Past Medical History:  Diagnosis Date  . Fatigue   . Shortness of breath   . Tachycardia   . Thyroid disease     Past Surgical History:  Procedure Laterality Date  . APPENDECTOMY    . endometrial ablasion      Family History  Problem Relation Age of Onset  . Restless legs syndrome Mother   . Arthritis Mother   . Neuropathy Mother   . Hyperlipidemia Mother   . Heart attack Father     Social History   Socioeconomic History  . Marital status: Single    Spouse name:  Not on file  . Number of children: Not on file  . Years of education: Not on file  . Highest education level: Not on file  Occupational History  . Not on file  Social Needs  . Financial resource strain: Not on file  . Food insecurity:    Worry: Not on file    Inability: Not on file  . Transportation needs:    Medical: Not on file    Non-medical: Not on file  Tobacco Use  . Smoking status: Never Smoker  . Smokeless tobacco: Never Used  Substance and Sexual Activity  . Alcohol use: Yes    Comment: occasional   . Drug use: Never  . Sexual activity: Not on file  Lifestyle  . Physical activity:    Days per week: Not on file    Minutes per session: Not on file  . Stress: Not on file  Relationships  . Social connections:    Talks on phone: Not on file    Gets together: Not on file    Attends religious service: Not on file    Active member of club or organization: Not on file    Attends meetings of clubs or organizations: Not on file    Relationship status: Not on file  . Intimate partner violence:    Fear of current or ex partner: Not on file    Emotionally abused:  Not on file    Physically abused: Not on file    Forced sexual activity: Not on file  Other Topics Concern  . Not on file  Social History Narrative  . Not on file    Current Meds  Medication Sig  . amitriptyline (ELAVIL) 25 MG tablet Take 25 mg by mouth at bedtime.  . Carboxymethylcellulose Sod PF (REFRESH CELLUVISC) 1 % GEL Place 1 application into both eyes as needed.  . Cholecalciferol (VITAMIN D-3) 5000 units TABS Take 5,000 Units by mouth.  . desonide (DESOWEN) 0.05 % cream Apply to eyelids BID PRN for itching  . fexofenadine (ALLEGRA) 180 MG tablet Take by mouth.  . fexofenadine (KLS ALLER-FEX) 180 MG tablet Take 180 mg by mouth daily.  . fluconazole (DIFLUCAN) 150 MG tablet Take 1 tablet by mouth as needed.  . Fluocinonide 0.1 % CREA 1 application by Other route as needed.  . fluticasone (FLONASE) 50  MCG/ACT nasal spray Place into the nose.  . hypromellose (GENTEAL) 0.3 % GEL ophthalmic ointment Apply 1 drop to eye as needed.  Marland Kitchen levothyroxine (SYNTHROID, LEVOTHROID) 50 MCG tablet Take 50 mcg by mouth daily before breakfast.  . MAGNESIUM MALATE PO Take 1 tablet by mouth daily.  . Multiple Vitamins-Minerals (WOMENS DAILY FORMULA PO) Take 1 tablet by mouth daily.  . Nutritional Supplements (ADULT NUTRITIONAL SUPPLEMENT + PO) Take 1 tablet by mouth daily. Nuclezyme-Forte  . zolmitriptan (ZOMIG) 5 MG tablet Take 5 mg by mouth as needed for migraine.     Review of Systems  Constitution: Positive for malaise/fatigue and weight gain. Negative for decreased appetite and weight loss.  Eyes: Negative for visual disturbance.  Cardiovascular: Positive for dyspnea on exertion, leg swelling and palpitations. Negative for chest pain, claudication, orthopnea and syncope.  Respiratory: Negative for hemoptysis and wheezing.   Endocrine: Negative for cold intolerance and heat intolerance.  Hematologic/Lymphatic: Does not bruise/bleed easily.  Skin: Negative for nail changes.  Musculoskeletal: Negative for muscle weakness and myalgias.  Gastrointestinal: Negative for abdominal pain, change in bowel habit, nausea and vomiting.  Neurological: Negative for difficulty with concentration, dizziness, focal weakness and headaches.  Psychiatric/Behavioral: Negative for altered mental status and suicidal ideas.  All other systems reviewed and are negative.      Objective:     Blood pressure 119/75, pulse (!) 107, height  (1.549 m), weight 209 lb 6.4 oz (95 kg), SpO2 96 %.   Physical Exam  Constitutional: She is oriented to person, place, and time. Vital signs are normal. She appears well-developed and well-nourished.  HENT:  Head: Normocephalic and atraumatic.  Neck: Normal range of motion.  Cardiovascular: Regular rhythm, normal heart sounds and intact distal pulses. Tachycardia present.  Pulses:       Dorsalis pedis pulses are 2+ on the right side and 2+ on the left side.       Posterior tibial pulses are 2+ on the right side and 2+ on the left side.  1+ pitting edema bilaterally  Pulmonary/Chest: Effort normal and breath sounds normal. No accessory muscle usage. No respiratory distress.  Abdominal: Soft. Bowel sounds are normal.  Musculoskeletal: Normal range of motion.        General: Edema (bilateral lower extremity 1+) present.  Neurological: She is alert and oriented to person, place, and time.  Skin: Skin is warm and dry.  Vitals reviewed.  Echocardiogram 08/18/2018: Left ventricle cavity is normal in size. Moderate concentric hypertrophy of the left ventricle. Normal global wall motion. Doppler  evidence of grade I (impaired) diastolic dysfunction, normal LAP. Calculated EF 56%. No significant valvular abnormality seen.  Normal right atrial pressure.   Exercise treadmill stress test 08/18/2018: Indication:  Shortness of breath The patient exercised on Bruce protocol for  03:28 min. Patient achieved  5.20 METS and reached HR  bpm, which is  90% of maximum age-predicted HR.  Stress test terminated due to dyspnea. Exercise capacity was below average for age. HR Response to Exercise: Appropriate. BP Response to Exercise: Normal resting BP- appropriate response. Chest Pain: none. Patient had limiting dyspnea Arrhythmias: none. Resting EKG demonstrates Normal sinus rhythm. ST Changes: With peak exercise there was no ST-T changes of ischemia.  EKG 08/09/2018: Borderline sinus tachycardia at 92 bpm, left atrial enlargement, normal axis, early R wave progression, diffuse nonspecific T wave flattening.       Assessment & Recommendations:   1. Dyspnea on exertion Test results were discussed with the patient. Suspect her symptoms are related to her weight and deconditioning. Encouraged her to start regular exercise to see if her exercise capacity will improve.  2.  Palpitations Currently wearing event monitor. No critical reports thus far. Will follow up once complete for further recommendations.  3. Leg swelling Noted to have 1+ bilateral lower extremity edema; however, patient also feels that she is swelling everywhere. Will obtain CMP and BNP for evaluation. Will start her on Aldactone 25 mg daily. Will repeat BMP in 10 days.   4. Obesity (BMI 30-39.9) Suspect is major contributor to her symptoms. Diet modifications were discussed. Will continue to monitor her progress.   5. Hypothyroidism (acquired) Continue to follow up with PCP for management. She reports levels are now normalized.   Plan: I will notify her of lab results. Follow up in 3-4 weeks after event monitor for further recommendations and reevaluation.   Altamese [place], FNP-C [facility] Office: [phone] Fax: [phone]

## 2018-08-23 NOTE — Telephone Encounter (Signed)
Pt called stating that while doing her normal routine of getting ready, she began having some chest heaviness and discomfort in arm when she raised it. Not pain. Just started this morning. No other symptoms. Verbally discussed w/ AK. Thinks its muscular. Pt has f/u tomorrow for test results. Advised to monitor symptoms and to cb with any worsening symptoms.

## 2018-08-24 ENCOUNTER — Telehealth: Payer: Self-pay

## 2018-08-24 ENCOUNTER — Ambulatory Visit (INDEPENDENT_AMBULATORY_CARE_PROVIDER_SITE_OTHER): Payer: Managed Care, Other (non HMO) | Admitting: Cardiology

## 2018-08-24 ENCOUNTER — Encounter: Payer: Self-pay | Admitting: Cardiology

## 2018-08-24 VITALS — BP 119/75 | HR 107 | Ht 61.0 in | Wt 209.4 lb

## 2018-08-24 DIAGNOSIS — R0600 Dyspnea, unspecified: Secondary | ICD-10-CM

## 2018-08-24 DIAGNOSIS — M7989 Other specified soft tissue disorders: Secondary | ICD-10-CM

## 2018-08-24 DIAGNOSIS — R0609 Other forms of dyspnea: Secondary | ICD-10-CM

## 2018-08-24 DIAGNOSIS — E669 Obesity, unspecified: Secondary | ICD-10-CM

## 2018-08-24 DIAGNOSIS — R000 Tachycardia, unspecified: Secondary | ICD-10-CM

## 2018-08-24 DIAGNOSIS — R002 Palpitations: Secondary | ICD-10-CM | POA: Diagnosis not present

## 2018-08-24 DIAGNOSIS — E039 Hypothyroidism, unspecified: Secondary | ICD-10-CM

## 2018-08-25 ENCOUNTER — Encounter: Payer: Self-pay | Admitting: Cardiology

## 2018-08-25 LAB — COMPREHENSIVE METABOLIC PANEL
ALT: 20 IU/L (ref 0–32)
AST: 19 IU/L (ref 0–40)
Albumin/Globulin Ratio: 2 (ref 1.2–2.2)
Albumin: 4.2 g/dL (ref 3.8–4.9)
Alkaline Phosphatase: 96 IU/L (ref 39–117)
BUN/Creatinine Ratio: 16 (ref 9–23)
BUN: 12 mg/dL (ref 6–24)
Bilirubin Total: <0.2 mg/dL (ref 0.0–1.2)
CO2: 25 mmol/L (ref 20–29)
Calcium: 10.1 mg/dL (ref 8.7–10.2)
Chloride: 105 mmol/L (ref 96–106)
Creatinine, Ser: 0.73 mg/dL (ref 0.57–1.00)
GFR calc Af Amer: 108 mL/min/{1.73_m2} (ref >59)
GFR calc non Af Amer: 94 mL/min/{1.73_m2} (ref >59)
Globulin, Total: 2.1 g/dL (ref 1.5–4.5)
Glucose: 92 mg/dL (ref 65–99)
Potassium: 4.7 mmol/L (ref 3.5–5.2)
Sodium: 142 mmol/L (ref 134–144)
Total Protein: 6.3 g/dL (ref 6.0–8.5)

## 2018-08-25 LAB — BRAIN NATRIURETIC PEPTIDE: BNP: 45 pg/mL (ref 0.0–100.0)

## 2018-08-25 MED ORDER — SPIRONOLACTONE 25 MG PO TABS: 25.0000 mg | ORAL_TABLET | Freq: Every day | ORAL | 1 refills | Status: DC

## 2018-08-31 NOTE — Telephone Encounter (Signed)
Pt said that she has the Aldactone. It was a female that called regarding her lab results is all she remembers.

## 2018-08-31 NOTE — Progress Notes (Signed)
Called Pt and made her aware.

## 2018-08-31 NOTE — Telephone Encounter (Signed)
Pt called stating that someone called her this morning regarding sending in a new medication but its not at the pharmacy. There is not any documentation regarding this and the pt does not remember the name of the medication. Please advise.//ah

## 2018-08-31 NOTE — Telephone Encounter (Signed)
Not sure, I did not call her. Did she receive the Aldactone I had sent in last week? If not should be aldactone 25 mg daily.

## 2018-08-31 NOTE — Telephone Encounter (Signed)
Alexandria Ward called her and let her know that her labs were all normal. I did order BMP for in 10 days after being on the aldactone.

## 2018-09-07 DIAGNOSIS — R002 Palpitations: Secondary | ICD-10-CM

## 2018-09-09 NOTE — Telephone Encounter (Signed)
Event Monitor for 30 days Start date 08/09/2018: NSR and S. Tachycardia. Symptomatic transmission (fluttering) reveal NSR and S. Tachycardia.   Min HR 70 and Max HR 143/min at 2 pm on 08/18/18.

## 2018-09-16 ENCOUNTER — Other Ambulatory Visit: Payer: Self-pay | Admitting: Cardiology

## 2018-09-16 ENCOUNTER — Encounter: Payer: Self-pay | Admitting: Cardiology

## 2018-09-17 ENCOUNTER — Ambulatory Visit: Payer: Managed Care, Other (non HMO) | Admitting: Cardiology

## 2018-09-22 ENCOUNTER — Ambulatory Visit (INDEPENDENT_AMBULATORY_CARE_PROVIDER_SITE_OTHER): Payer: Managed Care, Other (non HMO) | Admitting: Cardiology

## 2018-09-22 ENCOUNTER — Encounter: Payer: Self-pay | Admitting: Cardiology

## 2018-09-22 ENCOUNTER — Other Ambulatory Visit: Payer: Self-pay

## 2018-09-22 VITALS — Ht 61.0 in | Wt 201.0 lb

## 2018-09-22 DIAGNOSIS — M7989 Other specified soft tissue disorders: Secondary | ICD-10-CM

## 2018-09-22 DIAGNOSIS — E669 Obesity, unspecified: Secondary | ICD-10-CM

## 2018-09-22 DIAGNOSIS — R000 Tachycardia, unspecified: Secondary | ICD-10-CM | POA: Diagnosis not present

## 2018-09-22 DIAGNOSIS — Z6836 Body mass index (BMI) 36.0-36.9, adult: Secondary | ICD-10-CM | POA: Insufficient documentation

## 2018-09-22 NOTE — Progress Notes (Signed)
Subjective:   Alexandria Ward, female    DOB: 03/14/1966, 53 y.o.   MRN: [id number]  Pearson GrippeKim, James, MD:  Chief Complaint  Patient presents with  . Results    echo, stress test, monitor  . Follow-up   This visit type was conducted due to national recommendations for restrictions regarding the COVID-19 Pandemic (e.g. social distancing).  This format is felt to be most appropriate for this patient at this time.  All issues noted in this document were discussed and addressed.  No physical exam was performed (except for noted visual exam findings with Telehealth visits).  The patient has consented to conduct a Telehealth visit and understands insurance will be billed.   I discussed the limitations of evaluation and management by telemedicine and the availability of in person appointments. The patient expressed understanding and agreed to proceed.  Virtual Visit via Video Note is as below  I connected with Alexandria Ward, on 09/22/18 at 0945 by a video enabled telemedicine application and verified that I am speaking with the correct person using two identifiers.     I have discussed with her regarding the safety during COVID Pandemic and steps and precautions including social distancing with the patient.    HPI: Alexandria Ward  is a 53 y.o. female  with history of migranes and hypothyroidism recently referred to us by Lauretta ChesterMary Prevost, NP for evaluation of tachycardia, fatigue, and new exercise intolerance.  Treadmill stress testing was performed on 08/18/2018 that revealed low exercise capacity, but no EKG changes concerning for ischemia. Echocardiogram was also performed at that time that revealed grade 1 diastolic dysfunction, otherwise, normal echo. She was started on Aldactone for fluid retention and now presents for 6 week follow up and to discuss event monitor results.   Since being on Aldactone and making significant diet changes, she does state that she is feeling some better and is feeling  less bloated. Continues to have some heart racing episodes. She was noted to have sinus tachycardia on 30 day event monitor that correlated with her symptoms. Continues to notice some leg swelling when she takes her shoes and socks off.   Reports being diagnosed with hypothyroidism last year; however, although levels are now stable, she has not had any improvement in her symptoms. She reports that she has gained 50 lbs over the last 1 year.   Migranes are well controlled with amitryptiline. No history of hyperlipidemia or hypertension.  Maternal grandfather had CAD and father had CAD with a few MI's (unsure of age). No former tobacco use.   Past Medical History:  Diagnosis Date  . Fatigue   . Shortness of breath   . Tachycardia   . Thyroid disease     Past Surgical History:  Procedure Laterality Date  . APPENDECTOMY    . endometrial ablasion      Family History  Problem Relation Age of Onset  . Restless legs syndrome Mother   . Arthritis Mother   . Neuropathy Mother   . Hyperlipidemia Mother   . Heart attack Father     Social History   Socioeconomic History  . Marital status: Single    Spouse name: Not on file  . Number of children: 0  . Years of education: Not on file  . Highest education level: Not on file  Occupational History  . Not on file  Social Needs  . Financial resource strain: Not on file  . Food insecurity:  Worry: Not on file    Inability: Not on file  . Transportation needs:    Medical: Not on file    Non-medical: Not on file  Tobacco Use  . Smoking status: Never Smoker  . Smokeless tobacco: Never Used  Substance and Sexual Activity  . Alcohol use: Yes    Comment: rarely  . Drug use: Never  . Sexual activity: Not on file  Lifestyle  . Physical activity:    Days per week: Not on file    Minutes per session: Not on file  . Stress: Not on file  Relationships  . Social connections:    Talks on phone: Not on file    Gets together: Not on  file    Attends religious service: Not on file    Active member of club or organization: Not on file    Attends meetings of clubs or organizations: Not on file    Relationship status: Not on file  . Intimate partner violence:    Fear of current or ex partner: Not on file    Emotionally abused: Not on file    Physically abused: Not on file    Forced sexual activity: Not on file  Other Topics Concern  . Not on file  Social History Narrative  . Not on file    Current Meds  Medication Sig  . amitriptyline (ELAVIL) 50 MG tablet Take 50 mg by mouth at bedtime.   . Carboxymethylcellulose Sod PF (REFRESH CELLUVISC) 1 % GEL Place 1 application into both eyes as needed.  . Cholecalciferol (VITAMIN D-3) 5000 units TABS Take 5,000 Units by mouth.  . desonide (DESOWEN) 0.05 % cream Apply to eyelids BID PRN for itching  . fexofenadine (ALLEGRA) 180 MG tablet Take by mouth.  . fluconazole (DIFLUCAN) 150 MG tablet Take 1 tablet by mouth as needed.  . Fluocinonide 0.1 % CREA 1 application by Other route as needed.  . fluticasone (FLONASE) 50 MCG/ACT nasal spray Place into the nose.  Marland Kitchen guaiFENesin (MUCINEX) 600 MG 12 hr tablet Take by mouth 2 (two) times daily as needed.   . hypromellose (GENTEAL) 0.3 % GEL ophthalmic ointment Apply 1 drop to eye as needed.  Marland Kitchen levothyroxine (SYNTHROID, LEVOTHROID) 50 MCG tablet Take 50 mcg by mouth daily before breakfast.  . MAGNESIUM MALATE PO Take 1 tablet by mouth daily.  . Multiple Vitamins-Minerals (WOMENS DAILY FORMULA PO) Take 1 tablet by mouth daily. Alive  . Nutritional Supplements (ADULT NUTRITIONAL SUPPLEMENT + PO) Take 1 tablet by mouth daily. Nuclezyme-Forte  . spironolactone (ALDACTONE) 25 MG tablet TAKE 1 TABLET BY MOUTH EVERY DAY  . zolmitriptan (ZOMIG) 5 MG tablet Take 5 mg by mouth as needed for migraine.     Review of Systems  Constitution: Positive for weight loss (intentional). Negative for decreased appetite, malaise/fatigue and weight gain.   Eyes: Negative for visual disturbance.  Cardiovascular: Positive for dyspnea on exertion, leg swelling and palpitations. Negative for chest pain, claudication, orthopnea and syncope.  Respiratory: Negative for hemoptysis and wheezing.   Endocrine: Negative for cold intolerance and heat intolerance.  Hematologic/Lymphatic: Does not bruise/bleed easily.  Skin: Negative for nail changes.  Musculoskeletal: Negative for muscle weakness and myalgias.  Gastrointestinal: Negative for abdominal pain, change in bowel habit, nausea and vomiting.  Neurological: Negative for difficulty with concentration, dizziness, focal weakness and headaches.  Psychiatric/Behavioral: Negative for altered mental status and suicidal ideas.  All other systems reviewed and are negative.      Objective:  Height 5\' 1"  (1.549 m), weight 201 lb (91.2 kg).   Physical Exam  Constitutional: She is oriented to person, place, and time. She appears well-developed and well-nourished. No distress.  Pulmonary/Chest: Effort normal. No respiratory distress.  Musculoskeletal:        General: No edema.  Neurological: She is alert and oriented to person, place, and time.  Skin: She is not diaphoretic.  Psychiatric: She has a normal mood and affect. Her behavior is normal.   30 day event monitor 02/24-03/24/2020: Normal sinus rhythm. Symptoms correlated with NSR/ Sinus tachycardia.   Echocardiogram 08/18/2018: Left ventricle cavity is normal in size. Moderate concentric hypertrophy of the left ventricle. Normal global wall motion. Doppler evidence of grade I (impaired) diastolic dysfunction, normal LAP. Calculated EF 56%. No significant valvular abnormality seen.  Normal right atrial pressure.   Exercise treadmill stress test 08/18/2018: Indication:  Shortness of breath The patient exercised on Bruce protocol for  03:28 min. Patient achieved  5.20 METS and reached HR  bpm, which is  90% of maximum age-predicted HR.  Stress  test terminated due to dyspnea. Exercise capacity was below average for age. HR Response to Exercise: Appropriate. BP Response to Exercise: Normal resting BP- appropriate response. Chest Pain: none. Patient had limiting dyspnea Arrhythmias: none. Resting EKG demonstrates Normal sinus rhythm. ST Changes: With peak exercise there was no ST-T changes of ischemia.  EKG 08/09/2018: Borderline sinus tachycardia at 92 bpm, left atrial enlargement, normal axis, early R wave progression, diffuse nonspecific T wave flattening.       Assessment & Recommendations:   1. Sinus tachycardia Event monitor results were discussed with the patient. Symptoms of rapid/fast heartbeat correlated with sinus tachycardia with fastest symptomatic episode being 115 bpm. She did have some episodes that were asymptomatic at 130-140 bpm. I suspect that this is related to her deconditioning and her weight. Hopefully this will continue to improve with continued weight loss and diet modifications. If she continues to have symptoms despite weight loss, could consider addition of beta blocker, but will hold off for now.   2. Leg swelling She has noted some improvement since being on Aldactone. She does continue to notice some occasional leg swelling and bloating. She does not currently have a way to check her blood pressure and does not remember her recent reading with PCP. Will request OV note for our records and if able will consider increasing up to 50 mg daily.   3. Obesity (BMI 30-39.9) She has been able to make significant diet changes and has been able to lose weight with doing this. Continued diet modifications including portion control and low sodium diet were discussed. She has joined a weight loss plan through her insurance and I have encouraged her to continue with this.   Plan: I will see her back in 8 weeks for follow up on her efforts toward weight loss and her symptoms.   Altamese [place], FNP-C [facility] [place] Office: [phone] Fax: [phone]

## 2018-10-05 ENCOUNTER — Telehealth: Payer: Self-pay | Admitting: Cardiology

## 2018-10-05 DIAGNOSIS — M7989 Other specified soft tissue disorders: Secondary | ICD-10-CM

## 2018-10-05 MED ORDER — SPIRONOLACTONE 25 MG PO TABS
50.0000 mg | ORAL_TABLET | Freq: Every day | ORAL | 3 refills | Status: DC
Start: 2018-10-05 — End: 2018-10-08

## 2018-10-05 NOTE — Telephone Encounter (Signed)
Made patient aware that I received OV note from PCP. Blood pressure is very well controlled. Will hold off on adding Metoprolol. She wishes to try increasing her dose of aldactone, agreeable to this. Will have BMP performed in 10 days. Advised to call for any questions or concerns.

## 2018-10-08 ENCOUNTER — Other Ambulatory Visit: Payer: Self-pay

## 2018-10-08 MED ORDER — SPIRONOLACTONE 25 MG PO TABS: 50.0000 mg | ORAL_TABLET | Freq: Every day | ORAL | 1 refills | Status: DC

## 2018-10-19 ENCOUNTER — Other Ambulatory Visit: Payer: Self-pay | Admitting: Cardiology

## 2018-10-19 MED ORDER — SPIRONOLACTONE 50 MG PO TABS: 50.0000 mg | ORAL_TABLET | Freq: Every day | ORAL | 1 refills | Status: DC

## 2018-10-20 LAB — BASIC METABOLIC PANEL
BUN/Creatinine Ratio: 16 (ref 9–23)
BUN: 13 mg/dL (ref 6–24)
CO2: 22 mmol/L (ref 20–29)
Calcium: 10.2 mg/dL (ref 8.7–10.2)
Chloride: 104 mmol/L (ref 96–106)
Creatinine, Ser: 0.81 mg/dL (ref 0.57–1.00)
GFR calc Af Amer: 95 mL/min/{1.73_m2} (ref >59)
GFR calc non Af Amer: 83 mL/min/{1.73_m2} (ref >59)
Glucose: 81 mg/dL (ref 65–99)
Potassium: 4.4 mmol/L (ref 3.5–5.2)
Sodium: 141 mmol/L (ref 134–144)

## 2018-11-17 ENCOUNTER — Ambulatory Visit: Payer: Managed Care, Other (non HMO) | Admitting: Cardiology

## 2018-12-01 LAB — BASIC METABOLIC PANEL
BUN: 16 (ref 4–21)
CO2: 23 — AB (ref 13–22)
Chloride: 105 (ref 99–108)
Creatinine: 0.8 (ref 0.5–1.1)
Glucose: 90

## 2018-12-01 LAB — COMPREHENSIVE METABOLIC PANEL
Albumin: 4.3 (ref 3.5–5.0)
Calcium: 10.3 (ref 8.7–10.7)
GFR calc non Af Amer: 85
Globulin: 2.4

## 2018-12-01 LAB — HEPATIC FUNCTION PANEL
ALT: 25 (ref 7–35)
AST: 24 (ref 13–35)
Alkaline Phosphatase: 109 (ref 25–125)
Bilirubin, Total: 0.3

## 2018-12-20 ENCOUNTER — Other Ambulatory Visit: Payer: Self-pay

## 2018-12-20 ENCOUNTER — Ambulatory Visit: Payer: BC Managed Care – PPO | Attending: Internal Medicine

## 2018-12-20 DIAGNOSIS — R42 Dizziness and giddiness: Secondary | ICD-10-CM

## 2018-12-20 DIAGNOSIS — R2689 Other abnormalities of gait and mobility: Secondary | ICD-10-CM | POA: Diagnosis not present

## 2018-12-20 NOTE — Therapy (Signed)
[facility] [address] [place], [place], [address] Phone: [phone]   Fax:  [phone]  Physical Therapy Evaluation  Patient Details  Name: Alexandria Ward MRN: [id number] Date of Birth: [birth year] Referring Provider (PT): Dr. Jani Gravel   Encounter Date: 12/20/2018  PT End of Session - 12/20/18 1905    Visit Number  1    Number of Visits  5    Date for PT Re-Evaluation  02/18/19    Authorization Type  [employer], 30 visit combined limit (PT/OT/speech) 0 used so far    Authorization - Visit Number  1    Authorization - Number of Visits  30    PT Start Time  1809    PT Stop Time  1859    PT Time Calculation (min)  50 min    Equipment Utilized During Treatment  --   S for safety   Activity Tolerance  Patient tolerated treatment well    Behavior During Therapy  [facility] for tasks assessed/performed       Past Medical History:  Diagnosis Date  . Fatigue   . Shortness of breath   . Tachycardia   . Thyroid disease     Past Surgical History:  Procedure Laterality Date  . APPENDECTOMY    . endometrial ablasion      There were no vitals filed for this visit.   Subjective Assessment - 12/20/18 1815    Subjective  Pt reported intermittent dizziness that began about a year ago, she wakes up with the room spinning. Last bout occurred a few weeks ago. At worst: 9-10/10, at best: 0/10. Dizziness can last several hours, but more wooziness and nausea after spinning. Pt has attempted canalith repositioning on her own and that has helped dizziness in the past but did not help this time. 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 Pt denied LOH. Pt with hx of migraines but HA typically does not occur with dizziness (but it did a few weeks ago).Pt has been seen by her MD for tachycardia and hypercalcemia-pt will f/u regarding lab work. Pt reported she does have issues with balance in dim lit rooms, and did fall a few years ago  because of this.    Pertinent History  Hypothyroidism, migraines, tachycardia, pt will f/u regarding lab work for hypercalcemia    Patient Stated Goals  To learn some techniques to use at home to prevent or get rid of dizziness.    Currently in Pain?  No/denies         [facility] PT Assessment - 12/20/18 1825      Assessment   Medical Diagnosis  Vertigo, BPV    Referring Provider (PT)  Dr. Jani Gravel    Onset Date/Surgical Date  02/14/18    Hand Dominance  Right    Prior Therapy  none for dizziness      Precautions   Precautions  None      Restrictions   Weight Bearing Restrictions  No      Balance Screen   Has the patient fallen in the past 6 months  No    Has the patient had a decrease in activity level because of a fear of falling?   No    Is the patient reluctant to leave their home because of a fear of falling?   No      Home Film/video editor residence    Living Arrangements  Alone    Available Help at Discharge  Family    Type of Home  House   townhome   Home Access  Level entry    [place]  None      Prior Function   Level of Independence  Independent    Vocation  Full time employment    Vocation Requirements  Works from home on computer    Leisure  Spend time with dogs, go shopping      Cognition   Overall Cognitive Status  Within Functional Limits for tasks assessed      Sensation   Additional Comments  Pt denied N/T      Ambulation/Gait   Ambulation/Gait  Yes    Ambulation/Gait Assistance  5: Supervision    Ambulation/Gait Assistance Details  S for safety, as pt amb. in guarded manner.     Ambulation Distance (Feet)  200 Feet    Assistive device  None    Gait Pattern  Step-through pattern;Decreased stride length;Decreased trunk rotation    Ambulation Surface  Level;Indoor    Gait velocity  3.41f/sec. no AD           Vestibular Assessment - 12/20/18 1832      Symptom Behavior   Subjective  history of current problem  Dizziness began approx. last fall (2019).    Type of Dizziness   Spinning   and then wooziness   Frequency of Dizziness  Intermittent (no real pattern) every few weeks or so    Duration of Dizziness  Minutes to hours    Symptom NPetra Kuba --   When she opens eyes   Aggravating Factors  --   Opening eyes in the morning on "dizzy days"   Relieving Factors  Rest;Slow movements;Comments   meclizine helped   Progression of Symptoms  No change since onset      Oculomotor Exam   Oculomotor Alignment  Normal    Spontaneous  Absent    Gaze-induced   Absent    Smooth Pursuits  Intact    Saccades  Intact    Comment  No c/o dizziness.       Oculomotor Exam-Fixation Suppressed    Left Head Impulse  Negative    Right Head Impulse  Negative      Vestibulo-Ocular Reflex   VOR 1 Head Only (x 1 viewing)  WNL and no dizziness reported.     VOR Cancellation  Normal      Positional Testing   Dix-Hallpike  Dix-Hallpike Right;Dix-Hallpike Left    Horizontal Canal Testing  Horizontal Canal Right;Horizontal Canal Left      Dix-Hallpike Right   Dix-Hallpike Right Duration  none    Dix-Hallpike Right Symptoms  No nystagmus      Dix-Hallpike Left   Dix-Hallpike Left Duration  none    Dix-Hallpike Left Symptoms  No nystagmus      Horizontal Canal Right   Horizontal Canal Right Duration  none    Horizontal Canal Right Symptoms  Normal      Horizontal Canal Left   Horizontal Canal Left Duration  none    Horizontal Canal Left Symptoms  Normal          Objective measurements completed on examination: See above findings.              PT Education - 12/20/18 1905    Education Details  PT discussed PT frequency, duration, and POC. PT educated pt  on outcome measures and exam findings.PT also explained that pt was negative for positional vertigo but that we will assess balance via FGA and SOT next session.    Person(s) Educated  Patient    Methods  Explanation     Comprehension  Verbalized understanding       PT Short Term Goals - 12/20/18 1916      PT SHORT TERM GOAL #1   Title  same as LTGs.        PT Long Term Goals - 12/20/18 1916      PT LONG TERM GOAL #1   Title  Perform SOT and FGA and write goals as indicated. TARGET DATE FOR ALL LTGS: 01/17/19    Status  New      PT LONG TERM GOAL #2   Title  Pt will report no dizziness in the last 4 weeks to improve QOL and safety during functional mobility.    Status  New      PT LONG TERM GOAL #3   Title  Pt will be IND in HEP to improve balance and dizziness.    Status  New      PT LONG TERM GOAL #4   Title  Pt will amb. 500' over even and uneven surfaces, while performing head turns, IND to improve functional mobility.    Status  New             Plan - 12/20/18 1909    Clinical Impression Statement  Pt is a pleasant 52 y/o female presenting to OPPT neuro for dizziness. Pt's PMH significant for the following: Hypothyroidism, migraines, tachycardia, pt will f/u regarding lab work for hypercalcemia. Pt's gait speed was WNL. Positional testing and vestibular exam negative for dizziness. Pt's s/s consistent with BPPV but appears to be resolved, therefore, PT will continue to assess for vertigo as indicated and treat balance impairments as indicated. Balance not formally assessed 2/2 time constraints but PT will assess next session and treat as indicated. Please see below for deficits. Pt would benefit from skilled PT to improve dizziness and safety during functional mobility.    Personal Factors and Comorbidities  Comorbidity 3+    Examination-Activity Limitations  Bed Mobility;Stand;Transfers;Locomotion Level    Examination-Participation Restrictions  Other   work   Stability/Clinical Decision Making  Evolving/Moderate complexity    Clinical Decision Making  Moderate    Rehab Potential  Good    PT Frequency  1x / week    PT Duration  4 weeks    PT Treatment/Interventions  ADLs/Self  Care Home Management;Canalith Repostioning;Biofeedback;Gait training;Functional mobility training;Stair training;Therapeutic activities;Therapeutic exercise;Balance training;Patient/family education;Neuromuscular re-education;Manual techniques;Vestibular    PT Next Visit Plan  Perform SOT and FGA, write goals as indicated. Provide balance HEP as indicated. Early d/c if balance WNL.    Consulted and Agree with Plan of Care  Patient       Patient will benefit from skilled therapeutic intervention in order to improve the following deficits and impairments:  Abnormal gait, Dizziness, Decreased balance, Decreased mobility, Impaired flexibility, Hypomobility, Other (comment)(flexibility and joint mobility not formally assessed but suspected based on limited Cx range during positional testing)  Visit Diagnosis: 1. Dizziness and giddiness   2. Other abnormalities of gait and mobility        Problem List Patient Active Problem List   Diagnosis Date Noted  . Leg swelling 09/22/2018  . Obesity (BMI 30-39.9) 09/22/2018  . Tachycardia 08/09/2018  . Palpitations 08/09/2018  . Dyspnea on exertion   08/09/2018  . Hypothyroidism (acquired) 08/09/2018  . Right lateral epicondylitis 08/31/2017  . Bilateral dry eyes 09/01/2014  . Hx of LASIK 12/28/2013  . MGD (meibomian gland disease), unspecified laterality 12/28/2013  . Nuclear sclerosis, bilateral 12/28/2013    Alexandria Ward L 12/20/2018, 7:19 PM  [facility] [address] [place], [place], [address] Phone: [phone]   Fax:  [phone]  Name: KRYSTINA STRIETER MRN: [id number] Date of Birth: 11-26-1965  Geoffry Paradise, PT,DPT 12/20/18 7:21 PM Phone: [phone] Fax: [phone]

## 2019-01-17 ENCOUNTER — Other Ambulatory Visit: Payer: Self-pay | Admitting: Cardiology

## 2019-01-17 NOTE — Telephone Encounter (Signed)
Do they need to be on this ?

## 2019-01-20 ENCOUNTER — Ambulatory Visit
Payer: BC Managed Care – PPO | Attending: Internal Medicine | Admitting: Rehabilitative and Restorative Service Providers"

## 2019-01-20 ENCOUNTER — Other Ambulatory Visit: Payer: Self-pay

## 2019-01-20 DIAGNOSIS — R2689 Other abnormalities of gait and mobility: Secondary | ICD-10-CM | POA: Insufficient documentation

## 2019-01-20 DIAGNOSIS — R42 Dizziness and giddiness: Secondary | ICD-10-CM | POA: Diagnosis not present

## 2019-01-20 NOTE — Patient Instructions (Signed)
Access Code: [email or web address]  URL: [email or web address]  Date: 01/20/2019  Prepared by: Rudell Cobb   Program Notes  This is for positional vertigo. You would only perform if changing positions created a room spinning sensation. Do this exercise 2 times per day x 5 repetitions. You would repeat until you have 2 days symptom free.   Exercises Brandt-Daroff Vestibular Exercise - 5 reps - 1 sets - 2x daily - 7x weekly

## 2019-01-21 ENCOUNTER — Encounter: Payer: Self-pay | Admitting: Rehabilitative and Restorative Service Providers"

## 2019-01-21 NOTE — Therapy (Signed)
[facility] [address] [place], [place], [address] Phone: [phone]   Fax:  [phone]  Physical Therapy Treatment and Discharge Summary  Patient Details  Name: Alexandria Ward MRN: [id number] Date of Birth: 12/29/1965 Referring Provider (PT): Dr. Jani Gravel  CLINIC OPERATION CHANGES: Outpatient Neuro Rehab is open at lower capacity following universal masking, social distancing, and patient screening.  The patient's COVID risk of complications score is 1.  Encounter Date: 01/20/2019  PT End of Session - 01/21/19 0911    Visit Number  2    Number of Visits  5    Date for PT Re-Evaluation  02/18/19    Authorization Type  [employer], 30 visit combined limit (PT/OT/speech) 0 used so far    Authorization - Visit Number  1    Authorization - Number of Visits  30    PT Start Time  8366    PT Stop Time  1835    PT Time Calculation (min)  40 min    Equipment Utilized During Treatment  --   S for safety   Activity Tolerance  Patient tolerated treatment well    Behavior During Therapy  WFL for tasks assessed/performed       Past Medical History:  Diagnosis Date  . Fatigue   . Shortness of breath   . Tachycardia   . Thyroid disease     Past Surgical History:  Procedure Laterality Date  . APPENDECTOMY    . endometrial ablasion      There were no vitals filed for this visit.  Subjective Assessment - 01/20/19 1811    Subjective  The patient reports no symptoms since last time.  She reports this is typical pattern.    Pertinent History  Hypothyroidism, migraines, tachycardia, pt will f/u regarding lab work for hypercalcemia    Patient Stated Goals  To learn some techniques to use at home to prevent or get rid of dizziness.    Currently in Pain?  No/denies         [facility] PT Assessment - 01/21/19 0912      Ambulation/Gait   Ambulation/Gait Assistance  7: Independent    Assistive device  None    Gait Pattern  Within  Functional Limits    Ambulation Surface  Level;Indoor      Functional Gait  Assessment   Gait assessed   Yes    Gait Level Surface  Walks 20 ft in less than 5.5 sec, no assistive devices, good speed, no evidence for imbalance, normal gait pattern, deviates no more than 6 in outside of the 12 in walkway width.    Change in Gait Speed  Able to smoothly change walking speed without loss of balance or gait deviation. Deviate no more than 6 in outside of the 12 in walkway width.    Gait with Horizontal Head Turns  Performs head turns smoothly with no change in gait. Deviates no more than 6 in outside 12 in walkway width    Gait with Vertical Head Turns  Performs head turns with no change in gait. Deviates no more than 6 in outside 12 in walkway width.    Gait and Pivot Turn  Pivot turns safely within 3 sec and stops quickly with no loss of balance.    Step Over Obstacle  Is able to step over 2 stacked shoe boxes taped together (9 in total height) without changing gait speed. No evidence of imbalance.    Gait with  Narrow Base of Support  Is able to ambulate for 10 steps heel to toe with no staggering.    Gait with Eyes Closed  Walks 20 ft, slow speed, abnormal gait pattern, evidence for imbalance, deviates 10-15 in outside 12 in walkway width. Requires more than 9 sec to ambulate 20 ft.    Ambulating Backwards  Walks 20 ft, no assistive devices, good speed, no evidence for imbalance, normal gait    Steps  Alternating feet, must use rail.    Total Score  27    FGA comment:  27/30                   [facility] Adult PT Treatment/Exercise - 01/21/19 0912      Ambulation/Gait   Ambulation/Gait  Yes      Standardized Balance Assessment   Standardized Balance Assessment  Balance Master Testing      High Level Balance   High Level Balance Comments  Sensory organization test performed with patinet scoring 77% compared to age/height norms of 72%.  She is WNLs on all use of sensory systems for  balance.        Self-Care   Self-Care  Other Self-Care Comments    Other Self-Care Comments   The patient and PT discussed that since no further dizziness is present and she scores WNLs on Sensory organization testing, there are no HEP indicated at this time.  She requested home program for when dizziness presents (it comes/goes in episodes that last for hours).  PT demonstrated and discussed brandt daroff habituation and that this would only help if symptoms were positional in nature.      Vestibular Treatment/Exercise - 01/21/19 0277      Vestibular Treatment/Exercise   Vestibular Treatment Provided  Habituation    Habituation Exercises  Nestor Lewandowsky      Nestor Lewandowsky   Symptom Description   *Demonstrated brandt daroff habituation *see self care and assessment              PT Short Term Goals - 12/20/18 1916      PT SHORT TERM GOAL #1   Title  same as LTGs.        PT Long Term Goals - 01/21/19 0911      PT LONG TERM GOAL #1   Title  Perform SOT and FGA and write goals as indicated. TARGET DATE FOR ALL LTGS: 01/17/19    Status  Achieved      PT LONG TERM GOAL #2   Title  Pt will report no dizziness in the last 4 weeks to improve QOL and safety during functional mobility.    Status  Achieved      PT LONG TERM GOAL #3   Title  Pt will be IND in HEP to improve balance and dizziness.    Status  Deferred      PT LONG TERM GOAL #4   Title  Pt will amb. 500' over even and uneven surfaces, while performing head turns, IND to improve functional mobility.    Baseline  did not retest- FGA score is low risk for falls.    Status  Deferred            Plan - 01/21/19 4128    Clinical Impression Statement  The patient has a h/o episodic vertigo that lasts for hours.  She does have n/v at times with vertigo.  We discussed the benefit of journaling about her episodes to distinguish if the episodes  are consistent with migraine type presentation.  PT recommended paying  attention to other symptoms such as light sensitivity, noise sensitivity, tinnitus, aural fullness.  She does not have active symptoms at this time.  With her history, it is possible the episodes of vertigo are migrainous or meniere's type (however no audiologic symptoms at this point).  Tracking the episodes will allow for a more clear clinical presentation.  PT did not provide ongoing activities for HEP due to no current symptoms.  Provided habituation exercises, discussing waiting until n/v clears to begin.  We also discuss this would be indicated for positional type symptoms and at this time hers seem to resolve after hours (may be more consistent with migraine related vertigo).    PT Treatment/Interventions  ADLs/Self Care Home Management;Canalith Repostioning;Biofeedback;Gait training;Functional mobility training;Stair training;Therapeutic activities;Therapeutic exercise;Balance training;Patient/family education;Neuromuscular re-education;Manual techniques;Vestibular    PT Next Visit Plan  discharge today.    Consulted and Agree with Plan of Care  Patient       Patient will benefit from skilled therapeutic intervention in order to improve the following deficits and impairments:  Abnormal gait, Dizziness, Decreased balance, Decreased mobility, Impaired flexibility, Hypomobility, Other (comment)  Visit Diagnosis: 1. Dizziness and giddiness   2. Other abnormalities of gait and mobility       PHYSICAL THERAPY DISCHARGE SUMMARY  Visits from Start of Care: 2  Current functional level related to goals / functional outcomes: See goals above   Remaining deficits: none   Education / Equipment: HEP if symptoms are positional in nature in the future.  Plan: Patient agrees to discharge.  Patient goals were met. Patient is being discharged due to meeting the stated rehab goals.  ?????           Thank you for the referral of this patient. Rudell Cobb, MPT  [place],  PT 01/21/2019, 9:22 AM  [facility] [address] [place], [place], [address] Phone: [phone]   Fax:  [phone]  Name: Alexandria Ward MRN: [id number] Date of Birth: 1965/11/16

## 2019-01-27 ENCOUNTER — Ambulatory Visit: Payer: BC Managed Care – PPO | Admitting: Rehabilitative and Restorative Service Providers"

## 2019-02-03 ENCOUNTER — Encounter: Payer: Managed Care, Other (non HMO) | Admitting: Rehabilitative and Restorative Service Providers"

## 2019-02-10 ENCOUNTER — Encounter: Payer: Managed Care, Other (non HMO) | Admitting: Rehabilitative and Restorative Service Providers"

## 2019-03-03 DIAGNOSIS — E039 Hypothyroidism, unspecified: Secondary | ICD-10-CM | POA: Diagnosis not present

## 2019-03-09 DIAGNOSIS — E039 Hypothyroidism, unspecified: Secondary | ICD-10-CM | POA: Diagnosis not present

## 2019-03-09 DIAGNOSIS — R609 Edema, unspecified: Secondary | ICD-10-CM | POA: Diagnosis not present

## 2019-03-09 DIAGNOSIS — E785 Hyperlipidemia, unspecified: Secondary | ICD-10-CM | POA: Diagnosis not present

## 2019-03-09 DIAGNOSIS — R42 Dizziness and giddiness: Secondary | ICD-10-CM | POA: Diagnosis not present

## 2019-03-23 DIAGNOSIS — Z1231 Encounter for screening mammogram for malignant neoplasm of breast: Secondary | ICD-10-CM | POA: Diagnosis not present

## 2019-03-23 DIAGNOSIS — Z13820 Encounter for screening for osteoporosis: Secondary | ICD-10-CM | POA: Diagnosis not present

## 2019-03-23 DIAGNOSIS — Z6841 Body Mass Index (BMI) 40.0 and over, adult: Secondary | ICD-10-CM | POA: Diagnosis not present

## 2019-03-23 DIAGNOSIS — Z01419 Encounter for gynecological examination (general) (routine) without abnormal findings: Secondary | ICD-10-CM | POA: Diagnosis not present

## 2019-05-09 DIAGNOSIS — R635 Abnormal weight gain: Secondary | ICD-10-CM | POA: Diagnosis not present

## 2019-05-11 ENCOUNTER — Institutional Professional Consult (permissible substitution): Payer: Managed Care, Other (non HMO) | Admitting: Neurology

## 2019-05-25 DIAGNOSIS — R635 Abnormal weight gain: Secondary | ICD-10-CM | POA: Diagnosis not present

## 2019-05-31 ENCOUNTER — Telehealth: Payer: Self-pay | Admitting: Neurology

## 2019-05-31 ENCOUNTER — Institutional Professional Consult (permissible substitution): Payer: Managed Care, Other (non HMO) | Admitting: Neurology

## 2019-05-31 NOTE — Telephone Encounter (Signed)
Pt was a no show for the apt today.

## 2019-06-02 ENCOUNTER — Other Ambulatory Visit (INDEPENDENT_AMBULATORY_CARE_PROVIDER_SITE_OTHER): Payer: Self-pay

## 2019-06-02 DIAGNOSIS — R609 Edema, unspecified: Secondary | ICD-10-CM | POA: Diagnosis not present

## 2019-06-02 DIAGNOSIS — E039 Hypothyroidism, unspecified: Secondary | ICD-10-CM | POA: Diagnosis not present

## 2019-06-02 DIAGNOSIS — E785 Hyperlipidemia, unspecified: Secondary | ICD-10-CM | POA: Diagnosis not present

## 2019-06-02 LAB — BASIC METABOLIC PANEL
BUN: 13 (ref 4–21)
Creatinine: 0.8 (ref 0.5–1.1)
Glucose: 98
Potassium: 4.3 (ref 3.4–5.3)
Sodium: 143 (ref 137–147)

## 2019-06-02 LAB — HEPATIC FUNCTION PANEL
Alkaline Phosphatase: 104 (ref 25–125)
Bilirubin, Total: 0.4

## 2019-06-02 LAB — COMPREHENSIVE METABOLIC PANEL
Albumin: 3.6 (ref 3.5–5.0)
GFR calc non Af Amer: 78.93
Globulin: 3.2

## 2019-06-02 LAB — LIPID PANEL
HDL: 51 (ref 35–70)
LDL Cholesterol: 146
LDl/HDL Ratio: 209

## 2019-06-02 LAB — TSH: TSH: 2.04 (ref 0.41–5.90)

## 2019-06-08 DIAGNOSIS — E039 Hypothyroidism, unspecified: Secondary | ICD-10-CM | POA: Diagnosis not present

## 2019-06-08 DIAGNOSIS — R609 Edema, unspecified: Secondary | ICD-10-CM | POA: Diagnosis not present

## 2019-06-08 DIAGNOSIS — R000 Tachycardia, unspecified: Secondary | ICD-10-CM | POA: Diagnosis not present

## 2019-06-13 ENCOUNTER — Telehealth: Payer: Self-pay

## 2019-06-13 NOTE — Telephone Encounter (Signed)
NOTES ON FILE FROM [facility] [phone], SENT REFERRAL TO SCHEDULING 

## 2019-06-28 ENCOUNTER — Other Ambulatory Visit: Payer: Self-pay

## 2019-06-28 ENCOUNTER — Ambulatory Visit: Payer: BC Managed Care – PPO | Admitting: Neurology

## 2019-06-28 ENCOUNTER — Encounter: Payer: Self-pay | Admitting: Neurology

## 2019-06-28 VITALS — BP 111/74 | HR 109 | Temp 97.6°F | Ht 61.0 in | Wt 215.0 lb

## 2019-06-28 DIAGNOSIS — M7989 Other specified soft tissue disorders: Secondary | ICD-10-CM | POA: Diagnosis not present

## 2019-06-28 DIAGNOSIS — E662 Morbid (severe) obesity with alveolar hypoventilation: Secondary | ICD-10-CM

## 2019-06-28 DIAGNOSIS — R0609 Other forms of dyspnea: Secondary | ICD-10-CM

## 2019-06-28 DIAGNOSIS — E039 Hypothyroidism, unspecified: Secondary | ICD-10-CM

## 2019-06-28 DIAGNOSIS — E669 Obesity, unspecified: Secondary | ICD-10-CM | POA: Insufficient documentation

## 2019-06-28 DIAGNOSIS — R0600 Dyspnea, unspecified: Secondary | ICD-10-CM | POA: Diagnosis not present

## 2019-06-28 DIAGNOSIS — Z6838 Body mass index (BMI) 38.0-38.9, adult: Secondary | ICD-10-CM | POA: Insufficient documentation

## 2019-06-28 DIAGNOSIS — R000 Tachycardia, unspecified: Secondary | ICD-10-CM

## 2019-06-28 DIAGNOSIS — R0683 Snoring: Secondary | ICD-10-CM | POA: Insufficient documentation

## 2019-06-28 DIAGNOSIS — Z68.41 Body mass index (BMI) pediatric, greater than or equal to 95th percentile for age: Secondary | ICD-10-CM

## 2019-06-28 DIAGNOSIS — G478 Other sleep disorders: Secondary | ICD-10-CM

## 2019-06-28 DIAGNOSIS — R002 Palpitations: Secondary | ICD-10-CM

## 2019-06-28 DIAGNOSIS — G4719 Other hypersomnia: Secondary | ICD-10-CM

## 2019-06-28 DIAGNOSIS — Z6833 Body mass index (BMI) 33.0-33.9, adult: Secondary | ICD-10-CM | POA: Insufficient documentation

## 2019-06-28 NOTE — Progress Notes (Signed)
SLEEP MEDICINE CLINIC    Provider:  Melvyn Novas, MD  Primary Care Physician:  Pearson Grippe, MD [address] [place] [address] [place] [address]     Referring Provider: Pearson Grippe, Md [address] [place],  [place] [address]          Chief Complaint according to patient   Patient presents with:    . New Patient (Initial Visit)     complaints of feeling tired all the time, their dentist suggested that she consider checking out OSA. she does know that she snores. she has had significant weight gain. had a SS several yrs ago but was never advised of apnea being a concern. She remembers her PSG  Was in-lab and she barely fell asleep.       HISTORY OF PRESENT ILLNESS:  Alexandria Ward is a 54 y.o. year old Caucasian female patient seen upon referral from Dr Selena Batten, on 06/28/2019.  Chief concern according to patient :  Non restorative sleep, weight gain and fatigue. She gained 50 pounds , is menopausal. Was diagnosed with hypothyroidism 12 month ago.    I have the pleasure of seeing Alexandria Ward today, a right -handed Caucasian female with a possible sleep disorder.  She  has a past medical history of Fatigue, Shortness of breath, Tachycardia, and Thyroid disease.. She is taking synthroid. But developed tachycardia, ankle edema.and hypotension with Vertigo on BP medication.  She gained 50 pounds , is menopausal. Was diagnosed with hypothyroidism 12 month ago.  She has seen Dr. Sharl Ma , who did a (negative) cortisol test.     Sleep relevant medical history:  Family medical /sleep history: maternal Aunt had a CPAP machine, passed away.   Social history:  Patient is working as a Conservation officer, historic buildings - IT- and lives in a household alone. Family status is single , without children. Her mother lives across the street.  The patient currently works from home. Pets are present. 2 poodles. Tobacco use never .  ETOH use ; rare , Caffeine intake in form of Coffee( 1 mug in AM ) Soda( none )  Tea ( at lunch ) or energy drinks. Regular exercise- interrupted by Covid.    Sleep habits are as follows: Lunch is with her mom, generally the biggest meal. The patient's dinner time is between 8-8.30 PM. She eats a light dinner. She often falls asleep at her moms watching TV.  Returns to her house- has GERD- takes amitriptyline. She wears a mouth guard. She sis a mouth breather.  The patient goes to bed at 12- PM to 2 AM. She continues to sleep for 3 hours, wakes for 2 bathroom breaks, the first time at 4 AM.   The preferred sleep position is right lateral- , with the support of 2 pillows, the bed is flat- she prefers a recliner.  pillows. Dreams are reportedly infrequent.  7 AM is the usual rise time. The patient wakes up with several  Alarms  At 6.30-- 6.45 AM .   She reports not feeling refreshed or restored in AM, with symptoms such as dry mouth, morning headaches,  and residual fatigue. Naps are taken  lasting several hours and are more refreshing than nocturnal sleep.    Review of Systems: Out of a complete 14 system review, the patient complains of only the following symptoms, and all other reviewed systems are negative.:  Fatigue, sleepiness , snoring, fragmented sleep, Insomnia - nocturia,dry mouth.  How likely are you to doze in the following situations: 0 = not likely, 1 = slight chance, 2 = moderate chance, 3 = high chance   Sitting and Reading? Watching Television? Sitting inactive in a public place (theater or meeting)? As a passenger in a car for an hour without a break? Lying down in the afternoon when circumstances permit? Sitting and talking to someone? Sitting quietly after lunch without alcohol? In a car, while stopped for a few minutes in traffic?   Total = 12/ 24 points   FSS endorsed at 52/ 63 points.  GDS  : she loves her job, feels happy, not overworked.  Worried about her mother.   Social History   Socioeconomic History  . Marital status: Single     Spouse name: Not on file  . Number of children: 0  . Years of education: Not on file  . Highest education level: Not on file  Occupational History  . Not on file  Tobacco Use  . Smoking status: Never Smoker  . Smokeless tobacco: Never Used  Substance and Sexual Activity  . Alcohol use: Yes    Comment: rarely  . Drug use: Never  . Sexual activity: Not on file  Other Topics Concern  . Not on file  Social History Narrative  . Not on file   Social Determinants of Health   Financial Resource Strain:   . Difficulty of Paying Living Expenses: Not on file  Food Insecurity:   . Worried About Charity fundraiser in the Last Year: Not on file  . Ran Out of Food in the Last Year: Not on file  Transportation Needs:   . Lack of Transportation (Medical): Not on file  . Lack of Transportation (Non-Medical): Not on file  Physical Activity:   . Days of Exercise per Week: Not on file  . Minutes of Exercise per Session: Not on file  Stress:   . Feeling of Stress : Not on file  Social Connections:   . Frequency of Communication with Friends and Family: Not on file  . Frequency of Social Gatherings with Friends and Family: Not on file  . Attends Religious Services: Not on file  . Active Member of Clubs or Organizations: Not on file  . Attends Archivist Meetings: Not on file  . Marital Status: Not on file    Family History  Problem Relation Age of Onset  . Restless legs syndrome Mother   . Arthritis Mother   . Neuropathy Mother   . Hyperlipidemia Mother   . Heart attack Father     Past Medical History:  Diagnosis Date  . Fatigue   . Shortness of breath   . Tachycardia   . Thyroid disease     Past Surgical History:  Procedure Laterality Date  . APPENDECTOMY    . endometrial ablasion       Current Outpatient Medications on File Prior to Visit  Medication Sig Dispense Refill  . amitriptyline (ELAVIL) 50 MG tablet Take 50 mg by mouth at bedtime.   4  . b complex  vitamins capsule Take 1 capsule by mouth daily.    . Carboxymethylcellulose Sod PF (REFRESH CELLUVISC) 1 % GEL Place 1 application into both eyes as needed.    . Cholecalciferol (VITAMIN D-3) 5000 units TABS Take 5,000 Units by mouth.    . desonide (DESOWEN) 0.05 % cream Apply to eyelids BID PRN for itching    . famotidine (PEPCID) 20 MG tablet  Take 20 mg by mouth at bedtime as needed for heartburn or indigestion.    . fexofenadine (ALLEGRA) 180 MG tablet Take by mouth.    . fluconazole (DIFLUCAN) 150 MG tablet Take 1 tablet by mouth as needed.    . Fluocinonide 0.1 % CREA 1 application by Other route as needed.    . fluticasone (FLONASE) 50 MCG/ACT nasal spray Place into the nose.    . hypromellose (GENTEAL) 0.3 % GEL ophthalmic ointment Apply 1 drop to eye as needed.    Marland Kitchen levothyroxine (SYNTHROID, LEVOTHROID) 50 MCG tablet Take 50 mcg by mouth daily before breakfast.    . MAGNESIUM MALATE PO Take 1 tablet by mouth daily.    . Multiple Vitamins-Minerals (WOMENS DAILY FORMULA PO) Take 1 tablet by mouth daily. Alive    . spironolactone (ALDACTONE) 25 MG tablet Take 25 mg by mouth daily.    Marland Kitchen zolmitriptan (ZOMIG) 5 MG tablet Take 5 mg by mouth as needed for migraine.     No current facility-administered medications on file prior to visit.    Allergies  Allergen Reactions  . Azithromycin Rash  . Ciprofloxacin Rash  . Penicillins Rash    Cant remember the reaction or the severity of it  . Tape Rash    Physical exam:  Today's Vitals   06/28/19 1511  BP: 111/74  Pulse: (!) 109  Temp: 97.6 F (36.4 C)  Weight: 215 lb (97.5 kg)  Height: 5\' 1"  (1.549 m)   Body mass index is 40.62 kg/m.   Wt Readings from Last 3 Encounters:  06/28/19 215 lb (97.5 kg)  09/22/18 201 lb (91.2 kg)  08/24/18 209 lb 6.4 oz (95 kg)     Ht Readings from Last 3 Encounters:  06/28/19 5\' 1"  (1.549 m)  09/22/18 5\' 1"  (1.549 m)  08/24/18 5\' 1"  (1.549 m)      General: The patient is awake, alert and  appears not in acute distress. The patient is well groomed. Head: Normocephalic, atraumatic. Neck is supple. Mallampati 4,  neck circumference: 16. 25  inches . Nasal airflow patent.  Retrognathia is not  seen.  Dental status: intact  Cardiovascular:  Regular rate and cardiac rhythm by pulse,  without distended neck veins. Respiratory: Lungs are clear to auscultation.  Skin:  Evidence of ankle edema, but no rash. Trunk: The patient's posture is erect.   Neurologic exam : The patient is awake and alert, oriented to place and time.   Memory subjective described as intact.  Attention span & concentration ability appears normal.  Speech is fluent,  without  dysarthria, dysphonia or aphasia.  Mood and affect are appropriate.   Cranial nerves: no loss of smell or taste reported  Pupils are equal and briskly reactive to light. Funduscopic exam deferred..  Extraocular movements in vertical and horizontal planes were intact and without nystagmus. No Diplopia. Visual fields by finger perimetry are intact. Hearing was intact to soft voice and finger rubbing.    Facial sensation intact to fine touch.  Facial motor strength is symmetric and tongue and uvula move midline.  Neck ROM : rotation, tilt and flexion extension were normal for age and shoulder shrug was symmetrical.    Motor exam:  Symmetric bulk, tone and ROM.   Normal tone without cog wheeling, symmetric grip strength .   Sensory:  Fine touch, pinprick and vibration were tested  and  normal.  Proprioception tested in the upper extremities was normal.   Coordination: Rapid alternating movements  in the fingers/hands were of normal speed.  The Finger-to-nose maneuver was intact without evidence of ataxia, dysmetria or tremor.   Gait and station: Patient could rise unassisted from a seated position, walked without assistive device.  Toe and heel walk were deferred.  Deep tendon reflexes: in the  upper and lower extremities are  symmetrically attenuated and intact.  Babinski response was deferred .        After spending a total time of 50 minutes face to face including additional time for physical and neurologic examination, review of laboratory studies,  personal review of imaging studies, reports and results of other testing and review of referral information / records as far as provided in visit, I have established the following assessments:  1) Alexandria Ward  described how the last 18 months maybe 12 months have significantly changed her feeling of wellbeing.    Her sleep has become less restorative, she lacks any energy and daytime and feels defeated by the significant weight gain.  She has now reached a body mass index of 40.6 according to our scale in close, but has been no significant change.  She understands that she will be checked out for obstructive sleep apnea but that may be other endocrine disorders that affect her level of fatigue and sleepiness.    Namely her hypothyroidism but also her tendency to retain fluid and she has been taking amitriptyline which helps her to sleep and prevents headaches especially those of tension quality.  However this also may linger and give her a second grogginess in the morning.  We will definitely work on her sleep schedule to become more regular and to have a defined bedtime and rise time.    I would almost consider a sleep deprived thinking that she is often not in bed before midnight and rise it rises at 630 or 7 AM I would also like her to try to avoid daytime naps or restricted to a power nap of 30-minute duration.  This will help her not to lose sleep quality overnight.    Her Epworth sleepiness score at 12 points is not significant enough to worry about narcolepsy but it is certainly abnormal.  I will order an attended sleep study as I want to see heart rate and rhythm overnight given her history of recent sinus tachycardia I also want to make sure that she does not have  hypoventilation symptoms such as protracted prolonged hypoxemia at night.     My Plan is to proceed with:  1) sleep hygiene improvement.   2) consider medical weight loss program, Dr Dalbert Garnet.  3) attended sleep study for hypoxemia, obesity hypoventilation, tachycardia.   I would like to thank Pearson Grippe, MD , [address] [place] [address] [place],  [place] [address] for allowing me to meet with and to take care of this pleasant patient.   I plan to follow up either personally or through our NP within 3 month.   CC: I will share my notes with PCP and Cardiology .  Electronically signed by: Melvyn Novas, MD 06/28/2019 3:27 PM  [facility] Board certified by The [employer] of Sleep Medicine and Diplomate of the [employer] of Sleep Medicine. Board certified In Neurology through the ABPN, Fellow of the [employer] of Neu[employer]. Medical Director of [facility].

## 2019-06-28 NOTE — Patient Instructions (Signed)
Obesity Hypoventilation Syndrome  Obesity hypoventilation syndrome (OHS) means that you are not breathing well enough to get air in and out of your lungs efficiently (ventilation). This causes a low oxygen level and a high carbon dioxide level in your blood (hypoventilation). Having too much total body fat (obesity) is a significant risk factor for developing OHS. OHS makes it harder for your heart to pump oxygen-rich blood to your body. It can cause sleep disturbances and make you feel sleepy during the day. Over time, OHS can increase your risk for:  Heart disease.  High blood pressure (hypertension).  Reduced ability to absorb sugar from the bloodstream (insulin resistance).  Heart failure. Over time, OHS weakens your heart and can lead to heart failure. What are the causes? The exact cause of OHS is not known. Possible causes include:  Pressure on the lungs from excess body weight.  Obesity-related changes in how much air the lungs can hold (lung capacity) and how much they can expand (lung compliance).  Failure of the brain to regulate oxygen and carbon dioxide levels properly.  Chemicals (hormones) produced by excess fat cells interfering with breathing regulation.  A breathing condition in which breathing pauses or becomes shallow during sleep (sleep apnea). This condition can eventually cause the body to ventilate poorly and to hold onto carbon dioxide during the day. What increases the risk? You may have a greater risk for OHS if you:  Have a BMI of 30 or higher. BMI is an estimate of body fat that is calculated from height and weight. For adults, a BMI of 30 or higher is considered obese.  Are 40?56 years old.  Carry most of your excess weight around your waist.  Experience moderate symptoms of sleep apnea. What are the signs or symptoms? The most common symptoms of OHS are:  Daytime sleepiness.  Lack of energy.  Shortness of breath.  Morning headaches.  Sleep  apnea.  Trouble concentrating.  Irritability, mood swings, or depression.  Swollen veins in the neck.  Swelling of the legs. How is this diagnosed? Your health care provider may suspect OHS if you are obese and have poor breathing during the day and at night. Your health care provider will also do a physical exam. You may have tests to:  Measure your BMI.  Measure your blood oxygen level with a sensor placed on your finger (pulse oximetry).  Measure blood oxygen and carbon dioxide in a blood sample.  Measure the amount of red blood cells in a blood sample. OHS causes the number of red blood cells you have to increase (polycythemia).  Check your breathing ability (pulmonary function testing).  Check your breathing ability, breathing patterns, and oxygen level while you sleep (sleep study). You may also have a chest X-ray to rule out other breathing problems. You may have an electrocardiogram (ECG) and or echocardiogram to check for signs of heart failure. How is this treated? Weight loss is the most important part of treatment for OHS, and it may be the only treatment that you need. Other treatments may include:  Using a device to open your airway while you sleep, such as a continuous positive airway pressure (CPAP) machine that delivers oxygen to your airway through a mask.  Surgery (gastric bypass surgery) to lower your BMI. This may be needed if: ? You are very obese. ? Other treatments have not worked for you. ? Your OHS is very severe and is causing organ damage, such as heart failure. Follow these   instructions at home:  Medicines  Take over-the-counter and prescription medicines only as told by your health care provider.  Ask your health care provider what medicines are safe for you. You may be told to avoid medicines that can impair breathing and make OHS worse, such as sedatives and narcotics. Sleeping habits  If you are prescribed a CPAP machine, make sure you  understand and use the machine as directed.  Try to get 8 hours of sleep every night.  Go to bed at the same time every night, and get up at the same time every day. General instructions  Work with your health care provider to make a diet and exercise plan that helps you reach and maintain a healthy weight.  Eat a healthy diet.  Avoid smoking.  Exercise regularly as told by your health care provider.  During the evening, do not drink caffeine and do not eat heavy meals.  Keep all follow-up visits as told by your health care provider. This is important. Contact a health care provider if:  You experience new or worsening shortness of breath.  You have chest pain.  You have an irregular heartbeat (palpitations).  You have dizziness.  You faint.  You develop a cough.  You have a fever.  You have chest pain when you breathe (pleurisy). This information is not intended to replace advice given to you by your health care provider. Make sure you discuss any questions you have with your health care provider. Document Revised: 09/24/2018 Document Reviewed: 11/12/2015 Elsevier Patient Education  2020 [employer]  

## 2019-06-28 NOTE — Addendum Note (Signed)
Addended by: Melvyn Novas on: 06/28/2019 04:05 PM   Modules accepted: Orders

## 2019-07-13 ENCOUNTER — Ambulatory Visit (INDEPENDENT_AMBULATORY_CARE_PROVIDER_SITE_OTHER): Payer: BC Managed Care – PPO | Admitting: Family Medicine

## 2019-07-14 ENCOUNTER — Ambulatory Visit (INDEPENDENT_AMBULATORY_CARE_PROVIDER_SITE_OTHER): Payer: BC Managed Care – PPO | Admitting: Family Medicine

## 2019-07-14 ENCOUNTER — Encounter (INDEPENDENT_AMBULATORY_CARE_PROVIDER_SITE_OTHER): Payer: Self-pay | Admitting: Family Medicine

## 2019-07-14 ENCOUNTER — Other Ambulatory Visit: Payer: Self-pay

## 2019-07-14 VITALS — BP 94/58 | HR 99 | Temp 98.2°F | Ht 61.0 in | Wt 206.0 lb

## 2019-07-14 DIAGNOSIS — E039 Hypothyroidism, unspecified: Secondary | ICD-10-CM

## 2019-07-14 DIAGNOSIS — Z6839 Body mass index (BMI) 39.0-39.9, adult: Secondary | ICD-10-CM

## 2019-07-14 DIAGNOSIS — Z9189 Other specified personal risk factors, not elsewhere classified: Secondary | ICD-10-CM | POA: Diagnosis not present

## 2019-07-14 DIAGNOSIS — Z0289 Encounter for other administrative examinations: Secondary | ICD-10-CM

## 2019-07-14 DIAGNOSIS — E559 Vitamin D deficiency, unspecified: Secondary | ICD-10-CM | POA: Diagnosis not present

## 2019-07-14 DIAGNOSIS — I5189 Other ill-defined heart diseases: Secondary | ICD-10-CM

## 2019-07-14 DIAGNOSIS — R0602 Shortness of breath: Secondary | ICD-10-CM | POA: Diagnosis not present

## 2019-07-14 DIAGNOSIS — E7849 Other hyperlipidemia: Secondary | ICD-10-CM | POA: Diagnosis not present

## 2019-07-14 DIAGNOSIS — E538 Deficiency of other specified B group vitamins: Secondary | ICD-10-CM | POA: Diagnosis not present

## 2019-07-14 DIAGNOSIS — Z1331 Encounter for screening for depression: Secondary | ICD-10-CM

## 2019-07-14 DIAGNOSIS — R5383 Other fatigue: Secondary | ICD-10-CM

## 2019-07-14 DIAGNOSIS — I519 Heart disease, unspecified: Secondary | ICD-10-CM

## 2019-07-14 NOTE — Progress Notes (Signed)
Dear Alexandria Ward,   Thank you for referring Alexandria Ward to our clinic. The following note includes my evaluation and treatment recommendations.  Chief Complaint:   OBESITY Alexandria Ward (MR# [id number]) is a 54 y.o. female who presents for evaluation and treatment of obesity and related comorbidities. Current BMI is Body mass index is 38.92 kg/m.Marland Kitchen Alexandria Ward has been struggling with her weight for many years and has been unsuccessful in either losing weight, maintaining weight loss, or reaching her healthy weight goal.  Alexandria Ward is currently in the action stage of change and ready to dedicate time achieving and maintaining a healthier weight. Alexandria Ward is interested in becoming our patient and working on intensive lifestyle modifications including (but not limited to) diet and exercise for weight loss.  Alexandria Ward's habits were reviewed today and are as follows: her desired weight loss is 51 lbs, she started gaining weight in 2019, her heaviest weight ever was 212 pounds, she craves meat, potatoes, sweets and bread, she frequently makes poor food choices, she frequently eats larger portions than normal, she has binge eating behaviors and she struggles with emotional eating.  Depression Screen Alexandria Ward's Food and Mood (modified PHQ-9) score was 20.  Depression screen Alexandria Ward 2/9 07/14/2019  Decreased Interest 3  Down, Depressed, Hopeless 3  PHQ - 2 Score 6  Altered sleeping 1  Tired, decreased energy 3  Change in appetite 1  Feeling bad or failure about yourself  3  Trouble concentrating 3  Moving slowly or fidgety/restless 3  Suicidal thoughts 0  PHQ-9 Score 20  Difficult doing work/chores Extremely dIfficult   Subjective:   Other fatigue. [name] admits to daytime somnolence and admits to waking up still tired. Alexandria Ward has a history of symptoms of daytime fatigue, Epworth sleepiness scale and morning headache sometimes. Alexandria Ward states she does not sleep well most nights. Snoring is present. Apneic  episodes are not present. Epworth Sleepiness Score is 14.  Shortness of breath on exertion. Alexandria Ward notes increasing shortness of breath with exercising and seems to be worsening over time with weight gain. She notes getting out of breath sooner with activity than she used to. This has gotten worse recently. [name] denies shortness of breath at rest or orthopnea. Alexandria Ward has a history of obesity hypoventilation syndrome diagnosed by Alexandria Ward. She was instructed to have a sleep study done and is scheduled in 2 weeks.  Vitamin D deficiency. [name] is on OTC Vitamin D 5,000 a day. No recent labs. She does report fatigue.  Hypothyroidism (acquired). Alexandria Ward was diagnosed with hypothyroidism after gaining 50 lbs that year. She was started on levothyroxine by Alexandria Ward and then changed to Synthroid brand name. She is now on Armour Thyroid 15 mg.  No results found for: TSH  Positive depression screening. [name] has a positive depression screen with a PHQ-9 score of 20. She is not on any medications. She does note some emotional eating.  Grade I diastolic dysfunction. Noted with echo in 2020; results noted in Cardiology notes. She has had palpitations in the past.  Other hyperlipidemia. Alexandria Ward is not on a statin and is attempting to improve with diet.  No results found for: CHOL, HDL, LDLCALC, LDLDIRECT, TRIG, CHOLHDL Lab Results  Component Value Date   ALT 20 08/24/2018   AST 19 08/24/2018   ALKPHOS 96 08/24/2018   BILITOT <0.2 08/24/2018   The ASCVD Risk score Alexandria Ward., et al., 2013) failed to calculate for the following reasons:   Cannot  find a previous HDL lab   Cannot find a previous total cholesterol lab  B12 nutritional deficiency. Alexandria Ward is on B-complex vitamins. No recent labs. She does report fatigue.  No results found for: VITAMINB12  At risk for heart disease. [name] is at a higher than average risk for cardiovascular disease due to obesity. Reviewed: no chest pain on exertion, no  dyspnea on exertion, and no swelling of ankles.  Assessment/Plan:   Other fatigue. Alexandria Ward does feel that her weight is causing her energy to be lower than it should be. Fatigue may be related to obesity, depression or many other causes. Labs Alexandria Ward be ordered, and in the meanwhile, [name] Alexandria Ward focus on self care including making healthy food choices, increasing physical activity and focusing on stress reduction.  EKG 12-Lead, CBC with Differential/Platelet, Comprehensive metabolic panel, Folate, Hemoglobin A1c, Insulin, random ordered.  Shortness of breath on exertion. Alexandria Ward does feel that she gets out of breath more easily that she used to when she exercises. Alexandria Ward's shortness of breath appears to be obesity related and exercise induced. She has agreed to work on weight loss and gradually increase exercise to treat her exercise induced shortness of breath. Alexandria Ward follow with sleep study results.  Vitamin D deficiency.  Low Vitamin D level contributes to fatigue and are associated with obesity, breast, and colon cancer. She Alexandria Ward have VITAMIN D 25 Hydroxy (Vit-D Deficiency, Fractures) level checked today.  Hypothyroidism (acquired).  Patient with long-standing hypothyroidism. She appears euthyroid. Orders and follow up as documented in patient record. T3, T4, free, TSH levels ordered. Alexandria Ward Alexandria Ward follow-up with her PCP and we Alexandria Ward request records.  Counseling . Good thyroid control is important for overall health. Supratherapeutic thyroid levels are dangerous and Alexandria Ward not improve weight loss results. . The correct way to take levothyroxine is fasting, with water, separated by at least 30 minutes from breakfast, and separated by more than 4 hours from calcium, iron, multivitamins, acid reflux medications (PPIs).     Positive depression screening. Alexandria Ward had a positive depression screening. Depression is commonly associated with obesity and often results in emotional eating behaviors. We Alexandria Ward monitor this  closely and work on CBT to help improve the non-hunger eating patterns. She Alexandria Ward be referred to Dr. Dewaine Conger, our bariatric psychologist, for evaluation.  Grade I diastolic dysfunction. Alexandria Ward check labs and follow. She Alexandria Ward continue to work on diet and exercise.  Other hyperlipidemia. Cardiovascular risk and specific lipid/LDL goals reviewed.  We discussed several lifestyle modifications today and Alexandria Ward Alexandria Ward continue to work on diet, exercise and weight loss efforts. Orders and follow up as documented in patient record. Lipid Panel With LDL/HDL Ratio ordered.  Counseling Intensive lifestyle modifications are the first line treatment for this issue. . Dietary changes: Increase soluble fiber. Decrease simple carbohydrates. . Exercise changes: Moderate to vigorous-intensity aerobic activity 150 minutes per week if tolerated. . Lipid-lowering medications: see documented in medical record.     B12 nutritional deficiency.  The diagnosis was reviewed with the patient. Counseling provided today, see below. We Alexandria Ward continue to monitor. Orders and follow up as documented in patient record. Vitamin B12 level ordered. Alexandria Ward follow.  Counseling . The body needs vitamin B12: to make red blood cells; to make DNA; and to help the nerves work properly so they can carry messages from the brain to the body.  . The main causes of vitamin B12 deficiency include dietary deficiency, digestive diseases, pernicious anemia, and having a surgery in which part of the  stomach or small intestine is removed.  . Certain medicines can make it harder for the body to absorb vitamin B12. These medicines include: heartburn medications; some antibiotics; some medications used to treat diabetes, gout, and high cholesterol.  . In some cases, there are no symptoms of this condition. If the condition leads to anemia or nerve damage, various symptoms can occur, such as weakness or fatigue, shortness of breath, and numbness or tingling in your  hands and feet.   . Treatment:  o May include taking vitamin B12 supplements.  o Avoid alcohol.  o Eat lots of healthy foods that contain vitamin B12: - Beef, pork, chicken, [place], and organ meats, such as liver.  - Seafood: This includes clams, rainbow trout, salmon, tuna, and haddock. Eggs.  - Cereal and dairy products that are fortified: This means that vitamin B12 has been added to the food.      At risk for heart disease. Alexandria Ward was given approximately 30 minutes of coronary artery disease prevention counseling today. She is 54 y.o. female and has risk factors for heart disease including obesity. We discussed intensive lifestyle modifications today with an emphasis on specific weight loss instructions and strategies.   Repetitive spaced learning was employed today to elicit superior memory formation and behavioral change.  Class 2 severe obesity with serious comorbidity and body mass index (BMI) of 39.0 to 39.9 in adult, unspecified obesity type (HCC).  Alexandria Ward is currently in the action stage of change and her goal is to continue with weight loss efforts. I recommend Alexandria Ward begin the structured treatment plan as follows:  She has agreed to the Category 1 Plan + 100 calories.  Behavioral modification strategies: increasing lean protein intake, decreasing eating out and meal planning and cooking strategies.  She was informed of the importance of frequent follow-up visits to maximize her success with intensive lifestyle modifications for her multiple health conditions. She was informed we would discuss her lab results at her next visit unless there is a critical issue that needs to be addressed sooner. Alexandria Ward agreed to keep her next visit at the agreed upon time to discuss these results.  Objective:   Blood pressure (!) 94/58, pulse 99, temperature 98.2 F (36.8 C), temperature source Oral, height 5\' 1"  (1.549 m), weight 206 lb (93.4 kg), SpO2 95 %. Body mass index is 38.92 kg/m.  EKG:  Undetermined Rhythm, rate of 98 BPM. Nonspecific ST depression + Nonspecific T-abnormality. Nondiagnostic. Abnormal.    Indirect Calorimeter completed today shows a VO2 of 192 and a REE of 1334.  Her calculated basal metabolic rate is thus her basal metabolic rate is worse than expected.  General: Cooperative, alert, well developed, in no acute distress. HEENT: Conjunctivae and lids unremarkable. Cardiovascular: Regular rhythm.  Lungs: Normal work of breathing. Neurologic: No focal deficits.   Lab Results  Component Value Date   CREATININE 0.81 10/19/2018   BUN 13 10/19/2018   NA 141 10/19/2018   K 4.4 10/19/2018   CL 104 10/19/2018   CO2 22 10/19/2018   Lab Results  Component Value Date   ALT 20 08/24/2018   AST 19 08/24/2018   ALKPHOS 96 08/24/2018   BILITOT <0.2 08/24/2018   No results found for: HGBA1C No results found for: INSULIN No results found for: TSH No results found for: CHOL, HDL, LDLCALC, LDLDIRECT, TRIG, CHOLHDL No results found for: WBC, HGB, HCT, MCV, PLT No results found for: IRON, TIBC, FERRITIN  Attestation Statements:   Reviewed by  clinician on day of visit: allergies, medications, problem list, medical history, surgical history, family history, social history, and previous encounter notes.  I, Marianna Payment, am acting as Energy manager for Quillian Quince, MD   I have reviewed the above documentation for accuracy and completeness, and I agree with the above. - Quillian Quince, MD
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
## 2019-07-15 LAB — COMPREHENSIVE METABOLIC PANEL
ALT: 24 IU/L (ref 0–32)
AST: 22 IU/L (ref 0–40)
Albumin/Globulin Ratio: 1.8 (ref 1.2–2.2)
Albumin: 4.3 g/dL (ref 3.8–4.9)
Alkaline Phosphatase: 116 IU/L (ref 39–117)
BUN/Creatinine Ratio: 14 (ref 9–23)
BUN: 11 mg/dL (ref 6–24)
Bilirubin Total: 0.3 mg/dL (ref 0.0–1.2)
CO2: 23 mmol/L (ref 20–29)
Calcium: 10 mg/dL (ref 8.7–10.2)
Chloride: 104 mmol/L (ref 96–106)
Creatinine, Ser: 0.76 mg/dL (ref 0.57–1.00)
GFR calc Af Amer: 102 mL/min/{1.73_m2} (ref >59)
GFR calc non Af Amer: 89 mL/min/{1.73_m2} (ref >59)
Globulin, Total: 2.4 g/dL (ref 1.5–4.5)
Glucose: 85 mg/dL (ref 65–99)
Potassium: 4.5 mmol/L (ref 3.5–5.2)
Sodium: 140 mmol/L (ref 134–144)
Total Protein: 6.7 g/dL (ref 6.0–8.5)

## 2019-07-15 LAB — CBC WITH DIFFERENTIAL/PLATELET
Basophils Absolute: 0.1 10*3/uL (ref 0.0–0.2)
Basos: 1 %
EOS (ABSOLUTE): 0.2 10*3/uL (ref 0.0–0.4)
Eos: 2 %
Hematocrit: 42.9 % (ref 34.0–46.6)
Hemoglobin: 14.5 g/dL (ref 11.1–15.9)
Immature Grans (Abs): 0 10*3/uL (ref 0.0–0.1)
Immature Granulocytes: 0 %
Lymphocytes Absolute: 2.1 10*3/uL (ref 0.7–3.1)
Lymphs: 32 %
MCH: 32 pg (ref 26.6–33.0)
MCHC: 33.8 g/dL (ref 31.5–35.7)
MCV: 95 fL (ref 79–97)
Monocytes Absolute: 0.7 10*3/uL (ref 0.1–0.9)
Monocytes: 10 %
Neutrophils Absolute: 3.7 10*3/uL (ref 1.4–7.0)
Neutrophils: 55 %
Platelets: 335 10*3/uL (ref 150–450)
RBC: 4.53 x10E6/uL (ref 3.77–5.28)
RDW: 13.3 % (ref 11.7–15.4)
WBC: 6.7 10*3/uL (ref 3.4–10.8)

## 2019-07-15 LAB — T4, FREE: Free T4: 1.22 ng/dL (ref 0.82–1.77)

## 2019-07-15 LAB — VITAMIN B12: Vitamin B-12: 352 pg/mL (ref 232–1245)

## 2019-07-15 LAB — LIPID PANEL WITH LDL/HDL RATIO
Cholesterol, Total: 204 mg/dL — ABNORMAL HIGH (ref 100–199)
HDL: 56 mg/dL (ref >39)
LDL Chol Calc (NIH): 134 mg/dL — ABNORMAL HIGH (ref 0–99)
LDL/HDL Ratio: 2.4 ratio (ref 0.0–3.2)
Triglycerides: 79 mg/dL (ref 0–149)
VLDL Cholesterol Cal: 14 mg/dL (ref 5–40)

## 2019-07-15 LAB — INSULIN, RANDOM: INSULIN: 12.3 u[IU]/mL (ref 2.6–24.9)

## 2019-07-15 LAB — VITAMIN D 25 HYDROXY (VIT D DEFICIENCY, FRACTURES): Vit D, 25-Hydroxy: 31.7 ng/mL (ref 30.0–100.0)

## 2019-07-15 LAB — HEMOGLOBIN A1C
Est. average glucose Bld gHb Est-mCnc: 120 mg/dL
Hgb A1c MFr Bld: 5.8 % — ABNORMAL HIGH (ref 4.8–5.6)

## 2019-07-15 LAB — T3: T3, Total: 121 ng/dL (ref 71–180)

## 2019-07-15 LAB — FOLATE: Folate: 15.3 ng/mL (ref >3.0)

## 2019-07-15 LAB — TSH: TSH: 2.79 u[IU]/mL (ref 0.450–4.500)

## 2019-07-18 ENCOUNTER — Encounter (INDEPENDENT_AMBULATORY_CARE_PROVIDER_SITE_OTHER): Payer: Self-pay | Admitting: Family Medicine

## 2019-07-19 NOTE — Telephone Encounter (Signed)
Please advise 

## 2019-07-25 ENCOUNTER — Ambulatory Visit: Payer: BC Managed Care – PPO | Admitting: Neurology

## 2019-07-26 ENCOUNTER — Ambulatory Visit (INDEPENDENT_AMBULATORY_CARE_PROVIDER_SITE_OTHER): Payer: BC Managed Care – PPO | Admitting: Psychology

## 2019-07-26 ENCOUNTER — Other Ambulatory Visit: Payer: Self-pay

## 2019-07-26 DIAGNOSIS — F3289 Other specified depressive episodes: Secondary | ICD-10-CM

## 2019-07-28 ENCOUNTER — Other Ambulatory Visit: Payer: Self-pay

## 2019-07-28 ENCOUNTER — Encounter (INDEPENDENT_AMBULATORY_CARE_PROVIDER_SITE_OTHER): Payer: Self-pay | Admitting: *Deleted

## 2019-07-28 ENCOUNTER — Ambulatory Visit (INDEPENDENT_AMBULATORY_CARE_PROVIDER_SITE_OTHER): Payer: BC Managed Care – PPO | Admitting: Family Medicine

## 2019-07-28 ENCOUNTER — Encounter (INDEPENDENT_AMBULATORY_CARE_PROVIDER_SITE_OTHER): Payer: Self-pay | Admitting: Family Medicine

## 2019-07-28 ENCOUNTER — Ambulatory Visit: Payer: Managed Care, Other (non HMO) | Admitting: Cardiology

## 2019-07-28 VITALS — BP 76/53 | HR 106 | Temp 98.1°F | Ht 61.0 in | Wt 201.0 lb

## 2019-07-28 DIAGNOSIS — E7849 Other hyperlipidemia: Secondary | ICD-10-CM

## 2019-07-28 DIAGNOSIS — R7303 Prediabetes: Secondary | ICD-10-CM | POA: Diagnosis not present

## 2019-07-28 DIAGNOSIS — E559 Vitamin D deficiency, unspecified: Secondary | ICD-10-CM | POA: Diagnosis not present

## 2019-07-28 DIAGNOSIS — Z9189 Other specified personal risk factors, not elsewhere classified: Secondary | ICD-10-CM

## 2019-07-28 DIAGNOSIS — Z6838 Body mass index (BMI) 38.0-38.9, adult: Secondary | ICD-10-CM

## 2019-07-28 MED ORDER — VITAMIN D (ERGOCALCIFEROL) 1.25 MG (50000 UNIT) PO CAPS: 50000.0000 [IU] | ORAL_CAPSULE | ORAL | 0 refills | Status: DC

## 2019-07-28 NOTE — Progress Notes (Signed)
Chief Complaint:   OBESITY Alexandria Ward is here to discuss her progress with her obesity treatment plan along with follow-up of her obesity related diagnoses. Alexandria Ward is on the Category 1 Plan + 100 calories and states she is following her eating plan approximately 98% of the time. Alexandria Ward states she is doing 0 minutes 0 times per week.  Today's visit was #: 2 Starting weight: 206 lbs Starting date: 07/14/2019 Today's weight: 201 lbs Today's date: 07/28/2019 Total lbs lost to date: 5 Total lbs lost since last in-office visit: 5  Interim History: Alexandria Ward has done well with her Category 1 plan and weight loss. She states she feels better already, but she is getting a bit bored with her dinner options.  Subjective:   1. Other hyperlipidemia Alexandria Ward's LDL is elevated, and she is working on diet and exercise. She is not on statin. I discussed labs with the patient today.  2. Vitamin D deficiency Alexandria Ward's Vit D is low. She is not on Vit D currently. She notes fatigue. I discussed labs with the patient today.  3. Pre-diabetes Alexandria Ward has a new diagnosis of pre-diabetes. Her A1c and insulin are elevated. She notes improved polyphagia on her diet prescription. I discussed labs with the patient today.  4. At risk for diabetes mellitus Alexandria Ward is at higher than average risk for developing diabetes due to her obesity.   Assessment/Plan:   1. Other hyperlipidemia Cardiovascular risk and specific lipid/LDL goals reviewed. We discussed several lifestyle modifications today and Alexandria Ward will continue to work on diet, exercise and weight loss efforts. We will recheck labs in 3 months. Orders and follow up as documented in patient record.   Counseling Intensive lifestyle modifications are the first line treatment for this issue. . Dietary changes: Increase soluble fiber. Decrease simple carbohydrates. . Exercise changes: Moderate to vigorous-intensity aerobic activity 150 minutes per week if  tolerated. . Lipid-lowering medications: see documented in medical record.  2. Vitamin D deficiency Low Vitamin D level contributes to fatigue and are associated with obesity, breast, and colon cancer. Alexandria Ward agreed to start prescription Vitamin D 50,000 IU every week with no refill. She will follow-up for routine testing of Vitamin D, at least 2-3 times per year to avoid over-replacement. We will recheck labs in 3 months.  - Vitamin D, Ergocalciferol, (DRISDOL) 1.25 MG (50000 UNIT) CAPS capsule; Take 1 capsule (50,000 Units total) by mouth every 7 (seven) days.  Dispense: 4 capsule; Refill: 0  3. Pre-diabetes Alexandria Ward will continue to work on weight loss, diet, exercise, and decreasing simple carbohydrates to help decrease the risk of diabetes. We will recheck labs in 3 months.  4. At risk for diabetes mellitus Alexandria Ward was given approximately 30 minutes of diabetes education and counseling today. We discussed intensive lifestyle modifications today with an emphasis on weight loss as well as increasing exercise and decreasing simple carbohydrates in her diet. We also reviewed medication options with an emphasis on risk versus benefit of those discussed.   Repetitive spaced learning was employed today to elicit superior memory formation and behavioral change.  5. Class 2 severe obesity with serious comorbidity and body mass index (BMI) of 38.0 to 38.9 in adult, unspecified obesity type Alexandria Ward) Alexandria Ward is currently in the action stage of change. As such, her goal is to continue with weight loss efforts. She has agreed to the Category 1 Plan + 100 calories with lean meat equivalents discussed.   Exercise goals: No exercise has been prescribed at this  time.  Behavioral modification strategies: increasing lean protein intake, decreasing simple carbohydrates and increasing vegetables.  Alexandria Ward has agreed to follow-up with our clinic in 2 weeks. She was informed of the importance of frequent follow-up visits to  maximize her success with intensive lifestyle modifications for her multiple health conditions.   Objective:   Blood pressure (!) 76/53, pulse (!) 106, temperature 98.1 F (36.7 C), temperature source Oral, height 5\' 1"  (1.549 m), weight 201 lb (91.2 kg), SpO2 96 %. Body mass index is 37.98 kg/m.  General: Cooperative, alert, well developed, in no acute distress. HEENT: Conjunctivae and lids unremarkable. Cardiovascular: Regular rhythm.  Lungs: Normal work of breathing. Neurologic: No focal deficits.   Lab Results  Component Value Date   CREATININE 0.76 07/14/2019   BUN 11 07/14/2019   NA 140 07/14/2019   K 4.5 07/14/2019   CL 104 07/14/2019   CO2 23 07/14/2019   Lab Results  Component Value Date   ALT 24 07/14/2019   AST 22 07/14/2019   ALKPHOS 116 07/14/2019   BILITOT 0.3 07/14/2019   Lab Results  Component Value Date   HGBA1C 5.8 (H) 07/14/2019   Lab Results  Component Value Date   INSULIN 12.3 07/14/2019   Lab Results  Component Value Date   TSH 2.790 07/14/2019   Lab Results  Component Value Date   CHOL 204 (H) 07/14/2019   HDL 56 07/14/2019   LDLCALC 134 (H) 07/14/2019   TRIG 79 07/14/2019   Lab Results  Component Value Date   WBC 6.7 07/14/2019   HGB 14.5 07/14/2019   HCT 42.9 07/14/2019   MCV 95 07/14/2019   PLT 335 07/14/2019   No results found for: IRON, TIBC, FERRITIN  Attestation Statements:   Reviewed by clinician on day of visit: allergies, medications, problem list, medical history, surgical history, family history, social history, and previous encounter notes.   I, 07/16/2019, am acting as transcriptionist for Burt Knack, MD.  I have reviewed the above documentation for accuracy and completeness, and I agree with the above. -  Alexandria Quince, MD

## 2019-08-10 ENCOUNTER — Other Ambulatory Visit: Payer: Self-pay

## 2019-08-10 ENCOUNTER — Ambulatory Visit (INDEPENDENT_AMBULATORY_CARE_PROVIDER_SITE_OTHER): Payer: BC Managed Care – PPO | Admitting: Family Medicine

## 2019-08-10 ENCOUNTER — Ambulatory Visit (INDEPENDENT_AMBULATORY_CARE_PROVIDER_SITE_OTHER): Payer: BC Managed Care – PPO | Admitting: Neurology

## 2019-08-10 ENCOUNTER — Encounter (INDEPENDENT_AMBULATORY_CARE_PROVIDER_SITE_OTHER): Payer: Self-pay | Admitting: Family Medicine

## 2019-08-10 VITALS — BP 94/66 | HR 99 | Temp 98.3°F | Ht 61.0 in | Wt 196.0 lb

## 2019-08-10 DIAGNOSIS — E559 Vitamin D deficiency, unspecified: Secondary | ICD-10-CM | POA: Diagnosis not present

## 2019-08-10 DIAGNOSIS — G4710 Hypersomnia, unspecified: Secondary | ICD-10-CM | POA: Diagnosis not present

## 2019-08-10 DIAGNOSIS — R7303 Prediabetes: Secondary | ICD-10-CM | POA: Diagnosis not present

## 2019-08-10 DIAGNOSIS — Z6837 Body mass index (BMI) 37.0-37.9, adult: Secondary | ICD-10-CM

## 2019-08-10 DIAGNOSIS — Z9189 Other specified personal risk factors, not elsewhere classified: Secondary | ICD-10-CM | POA: Diagnosis not present

## 2019-08-10 DIAGNOSIS — G478 Other sleep disorders: Secondary | ICD-10-CM

## 2019-08-10 MED ORDER — VITAMIN D (ERGOCALCIFEROL) 1.25 MG (50000 UNIT) PO CAPS: 50000.0000 [IU] | ORAL_CAPSULE | ORAL | 0 refills | Status: DC

## 2019-08-10 NOTE — Progress Notes (Signed)
Chief Complaint:   OBESITY Alexandria Ward is here to discuss her progress with her obesity treatment plan along with follow-up of her obesity related diagnoses. Alexandria Ward is on the Category 1 Plan + 100 calories and states she is following her eating plan approximately 96% of the time. Alexandria Ward states she is doing 0 minutes 0 times per week.  Today's visit was #: 3 Starting weight: 206 lbs Starting date: 07/14/2019 Today's weight: 196 lbs Today's date: 08/10/2019 Total lbs lost to date: 10 Total lbs lost since last in-office visit: 5  Interim History: Alexandria Ward continues to do very well with weight loss. She notes some increased polyphagia at times. She is ready to start some activity but not sure what.  Subjective:   1. Vitamin D deficiency Alexandria Ward is stable on Vit D and she requests a refill today.  2. Pre-diabetes Alexandria Ward is doing well with diet and she denies hypoglycemia.  3. At risk for hypoglycemia Alexandria Ward is at increased risk for hypoglycemia due to changes in diet, diagnosis of diabetes, and/or insulin use. Alexandria Ward is not currently taking insulin.   Assessment/Plan:   1. Vitamin D deficiency Low Vitamin D level contributes to fatigue and are associated with obesity, breast, and colon cancer. Alexandria Ward agreed to continue OTC Vit D and prescription Vitamin D. We will refill prescription Vit D for 1 month. She will follow-up for routine testing of Vitamin D, at least 2-3 times per year to avoid over-replacement. We will recheck labs in 1 month.  - Vitamin D, Ergocalciferol, (DRISDOL) 1.25 MG (50000 UNIT) CAPS capsule; Take 1 capsule (50,000 Units total) by mouth every 7 (seven) days.  Dispense: 4 capsule; Refill: 0  2. Pre-diabetes Alexandria Ward will continue to work on weight loss, diet, exercise, and decreasing simple carbohydrates to help decrease the risk of diabetes. We will recheck labs in 1 month.  3. At risk for hypoglycemia Alexandria Ward was given approximately 15 minutes of counseling today regarding  prevention of hypoglycemia. She was advised of symptoms of hypoglycemia. Alexandria Ward was instructed to avoid skipping meals, eat regular protein rich meals and schedule low calorie snacks as needed.   Repetitive spaced learning was employed today to elicit superior memory formation and behavioral change  4. Class 2 severe obesity with serious comorbidity and body mass index (BMI) of 37.0 to 37.9 in adult, unspecified obesity type Alexandria Ward is currently in the action stage of change. As such, her goal is to continue with weight loss efforts. She has agreed to the Category 1 Plan + 200 calories   Exercise goals: Alexandria Ward is to start [employer] exercise videos for 10 minutes most days.  Behavioral modification strategies: increasing lean protein intake and better snacking choices.  Alexandria Ward has agreed to follow-up with our clinic in 2 to 3 weeks. She was informed of the importance of frequent follow-up visits to maximize her success with intensive lifestyle modifications for her multiple health conditions.   Objective:   Blood pressure 94/66, pulse 99, temperature 98.3 F (36.8 C), height 5\' 1"  (1.549 m), weight 196 lb (88.9 kg), SpO2 95 %. Body mass index is 37.03 kg/m.  General: Cooperative, alert, well developed, in no acute distress. HEENT: Conjunctivae and lids unremarkable. Cardiovascular: Regular rhythm.  Lungs: Normal work of breathing. Neurologic: No focal deficits.   Lab Results  Component Value Date   CREATININE 0.76 07/14/2019   BUN 11 07/14/2019   NA 140 07/14/2019   K 4.5 07/14/2019   CL 104 07/14/2019  CO2 23 07/14/2019   Lab Results  Component Value Date   ALT 24 07/14/2019   AST 22 07/14/2019   ALKPHOS 116 07/14/2019   BILITOT 0.3 07/14/2019   Lab Results  Component Value Date   HGBA1C 5.8 (H) 07/14/2019   Lab Results  Component Value Date   INSULIN 12.3 07/14/2019   Lab Results  Component Value Date   TSH 2.790 07/14/2019   Lab Results  Component  Value Date   CHOL 204 (H) 07/14/2019   HDL 56 07/14/2019   LDLCALC 134 (H) 07/14/2019   TRIG 79 07/14/2019   Lab Results  Component Value Date   WBC 6.7 07/14/2019   HGB 14.5 07/14/2019   HCT 42.9 07/14/2019   MCV 95 07/14/2019   PLT 335 07/14/2019   No results found for: IRON, TIBC, FERRITIN  Attestation Statements:   Reviewed by clinician on day of visit: allergies, medications, problem list, medical history, surgical history, family history, social history, and previous encounter notes.   I, Trixie Dredge, am acting as transcriptionist for Dennard Nip, MD.  I have reviewed the above documentation for accuracy and completeness, and I agree with the above. -  Dennard Nip, MD

## 2019-08-15 ENCOUNTER — Ambulatory Visit (INDEPENDENT_AMBULATORY_CARE_PROVIDER_SITE_OTHER): Payer: BC Managed Care – PPO | Admitting: Family Medicine

## 2019-08-21 DIAGNOSIS — G4733 Obstructive sleep apnea (adult) (pediatric): Secondary | ICD-10-CM | POA: Insufficient documentation

## 2019-08-21 NOTE — Procedures (Signed)
Patient Information     First Name: Lataysha Last Name: Morace ID: [id number]  Birth Date: September 07, 1965 Age: 54 Gender: Female  Referring Provider: Pearson Grippe, MD BMI: 40.8 (W=216 lb, H=5' 1'')  Neck Circ.:  16 '' Epworth:  12/24   Sleep Study Information    Study Date: Aug 10, 2019 S/H/A Version: [id number] / [id number] / 26  History:    Alexandria Ward is a 54 year- old Caucasian female patient and was seen upon referral from Dr Selena Batten on 06/28/2019. Alexandria Ward is a right -handed Caucasian female with a medical history of Fatigue, Shortness of breath, Tachycardia, and Thyroid disease. She is taking Synthroid but developed tachycardia, ankle edema. and hypotension with Vertigo on BP medication.  She gained 50 pounds, is menopausal. Was diagnosed with hypothyroidism 12 month ago.  She has seen Dr. Sharl Ma, who did a (negative) cortisol test.   Summary & Diagnosis:      Mild obstructive sleep apnea was documented with an overall Apnea- Hypopnea Index/ hour of sleep (AHI) of 9.2/h and strong REM sleep dependency. The REM sleep AHI was 32.3/h, three times the overall AHI. There was also supine sleep position dependency. Herat rate variability was within normal range and no clinically significant hypoxemia was noted. Moderately loud snoring was indicated by RDI.   Recommendations:     REM dependent Apnea is treated best with positive airway pressure therapy, CPAP or BiPAP. I will order an auto-titration capable CPAP device with a setting from 5-15 cm water and 2 cm EPR, heated humidity and mask of patient's choice and comfort.   Interpreting Physician: Melvyn Novas, MD             Sleep Summary  Oxygen Saturation Statistics   Start Study Time: End Study Time: Total Recording Time:       11:27:33 PM 6:37:25 AM 7 h, 9 min  Total Sleep Time % REM of Sleep Time:  6 h, 29 min 30.1    Mean: 94 Minimum: 87 Maximum: 98  Mean of Desaturations Nadirs (%):   91  Oxygen Desaturation. %:   4-9 10-20  >20 Total  Events Number Total    19  2 90.5 9.5  0 0.0  21 100.0  Oxygen Saturation: <90 <=88 <85 <80 <70  Duration (minutes): Sleep % 0.5 0.1  0.3 0.0  0.1 0.0 0.0 0.0 0.0 0.0     Respiratory Indices      Total Events REM NREM All Night  pRDI:  58  pAHI:  53 ODI:  21  pAHIc:  0  % CSR: 0.0 32.3 32.3 13.4 0.0 3.8 2.7 0.9 0.0 10.0 9.2 3.6 0.0       Pulse Rate Statistics during Sleep (BPM)      Mean: 82 Minimum: 58 Maximum: 102    Indices are calculated using technically valid sleep time of 5 h, 46 min. Central-Indices are calculated using technically valid sleep time of 5  h, 41 min. pRDI/pAHI are calculated using 02 desaturations ? 3%  Body Position Statistics  Position Supine Prone Right Left Non-Supine  Sleep (min) 232.9 0.0 156.0 1.0 157.0  Sleep % 59.7 0.0 40.0 0.3 40.3  pRDI 14.3 N/A 4.4 N/A 4.4  pAHI 13.7 N/A 3.2 N/A 3.2  ODI 5.8 N/A 0.8 N/A 0.8     Snoring Statistics Snoring Level (dB) >40 >50 >60 >70 >80 >Threshold (45)  Sleep (min) 162.7 4.1 1.0 0.0 0.0 31.0  Sleep % 41.7 1.0  0.3 0.0 0.0 7.9    Mean: 41 dB

## 2019-08-21 NOTE — Progress Notes (Signed)
Mild obstructive sleep apnea was documented with an overall  Apnea- Hypopnea Index/ hour of sleep (AHI) of 9.2/h and strong  REM sleep dependency. The REM sleep AHI was 32.3/h, three times  the overall AHI. There was also supine sleep position dependency.  Herat rate variability was within normal range and no clinically  significant hypoxemia was noted. Moderately loud snoring was  indicated by RDI.   Recommendations:    REM dependent Apnea is treated best with positive airway pressure  therapy, CPAP or BiPAP. I will order an auto-titration capable  CPAP device with a setting from 5-15 cm water and 2 cm EPR,  heated humidity and mask of patient's choice and comfort.   Interpreting Physician: Melvyn Novas, MD Mild obstructive sleep apnea was documented with an overall  Apnea- Hypopnea Index/ hour of sleep (AHI) of 9.2/h and strong  REM sleep dependency. The REM sleep AHI was 32.3/h, three times  the overall AHI. There was also supine sleep position dependency.  Herat rate variability was within normal range and no clinically  significant hypoxemia was noted. Moderately loud snoring was  indicated by RDI.   Recommendations:    REM dependent Apnea is treated best with positive airway pressure  therapy, CPAP or BiPAP. I will order an auto-titration capable  CPAP device with a setting from 5-15 cm water and 2 cm EPR,  heated humidity and mask of patient's choice and comfort.   Interpreting Physician: Melvyn Novas, MD

## 2019-08-23 ENCOUNTER — Telehealth: Payer: Self-pay | Admitting: Neurology

## 2019-08-23 NOTE — Telephone Encounter (Signed)
-----   Message from Melvyn Novas, MD sent at 08/21/2019  4:02 PM EST ----- Mild obstructive sleep apnea was documented with an overall  Apnea- Hypopnea Index/ hour of sleep (AHI) of 9.2/h and strong  REM sleep dependency. The REM sleep AHI was 32.3/h, three times  the overall AHI. There was also supine sleep position dependency.  Herat rate variability was within normal range and no clinically  significant hypoxemia was noted. Moderately loud snoring was  indicated by RDI.   Recommendations:    REM dependent Apnea is treated best with positive airway pressure  therapy, CPAP or BiPAP. I will order an auto-titration capable  CPAP device with a setting from 5-15 cm water and 2 cm EPR,  heated humidity and mask of patient's choice and comfort.   Interpreting Physician: Melvyn Novas, MD Mild obstructive sleep apnea was documented with an overall  Apnea- Hypopnea Index/ hour of sleep (AHI) of 9.2/h and strong  REM sleep dependency. The REM sleep AHI was 32.3/h, three times  the overall AHI. There was also supine sleep position dependency.  Herat rate variability was within normal range and no clinically  significant hypoxemia was noted. Moderately loud snoring was  indicated by RDI.   Recommendations:    REM dependent Apnea is treated best with positive airway pressure  therapy, CPAP or BiPAP. I will order an auto-titration capable  CPAP device with a setting from 5-15 cm water and 2 cm EPR,  heated humidity and mask of patient's choice and comfort.   Interpreting Physician: Melvyn Novas, MD

## 2019-08-23 NOTE — Telephone Encounter (Signed)
Called patient to discuss sleep study results. No answer at this time. LVM for the patient to call back.   

## 2019-08-23 NOTE — Telephone Encounter (Signed)
I called pt. I advised pt that Dr. Vickey Huger reviewed their sleep study results and found that pt has mild sleep apnea. Dr. Vickey Huger recommends that pt starts auto CPAP. I reviewed PAP compliance expectations with the pt. Pt is agreeable to starting a CPAP. I advised pt that an order will be sent to a DME, [facility], and [facility] will call the pt within about one week after they file with the pt's insurance. [facility] will show the pt how to use the machine, fit for masks, and troubleshoot the CPAP if needed. A follow up appt was made for insurance purposes with on Dr Vickey Huger at May 18,2021 at 3:30 pm. Pt verbalized understanding to arrive 15 minutes early and bring their CPAP. A letter with all of this information in it will be mailed to the pt as a reminder. I verified with the pt that the address we have on file is correct. Pt verbalized understanding of results. Pt had no questions at this time but was encouraged to call back if questions arise. I have sent the order to [facility] and have received confirmation that they have received the order.

## 2019-09-05 ENCOUNTER — Other Ambulatory Visit: Payer: Self-pay

## 2019-09-05 ENCOUNTER — Ambulatory Visit (INDEPENDENT_AMBULATORY_CARE_PROVIDER_SITE_OTHER): Payer: BC Managed Care – PPO | Admitting: Family Medicine

## 2019-09-05 ENCOUNTER — Encounter (INDEPENDENT_AMBULATORY_CARE_PROVIDER_SITE_OTHER): Payer: Self-pay | Admitting: Family Medicine

## 2019-09-05 VITALS — BP 103/70 | HR 102 | Temp 98.5°F | Ht 61.0 in | Wt 192.0 lb

## 2019-09-05 DIAGNOSIS — E039 Hypothyroidism, unspecified: Secondary | ICD-10-CM | POA: Diagnosis not present

## 2019-09-05 DIAGNOSIS — R7303 Prediabetes: Secondary | ICD-10-CM | POA: Diagnosis not present

## 2019-09-05 DIAGNOSIS — E559 Vitamin D deficiency, unspecified: Secondary | ICD-10-CM

## 2019-09-05 DIAGNOSIS — G4733 Obstructive sleep apnea (adult) (pediatric): Secondary | ICD-10-CM | POA: Diagnosis not present

## 2019-09-05 DIAGNOSIS — Z9189 Other specified personal risk factors, not elsewhere classified: Secondary | ICD-10-CM

## 2019-09-05 DIAGNOSIS — Z6836 Body mass index (BMI) 36.0-36.9, adult: Secondary | ICD-10-CM

## 2019-09-06 MED ORDER — VITAMIN D (ERGOCALCIFEROL) 1.25 MG (50000 UNIT) PO CAPS: 50000.0000 [IU] | ORAL_CAPSULE | ORAL | 0 refills | Status: DC

## 2019-09-06 NOTE — Progress Notes (Signed)
Chief Complaint:   OBESITY Alexandria Ward is here to discuss her progress with her obesity treatment plan along with follow-up of her obesity related diagnoses. Alexandria Ward is on the Category 1 Plan + 200 calories and states she is following her eating plan approximately 95% of the time. Alexandria Ward states she is doing 0 minutes 0 times per week.  Today's visit was #: 4 Starting weight: 206 lbs Starting date: 07/14/2019 Today's weight: 192 lbs Today's date: 09/05/2019 Total lbs lost to date: 14 Total lbs lost since last in-office visit: 4  Interim History: Alexandria Ward reports increased temptation with sugary snacks, like ice cream. We discussed safe approaches of increasing regular exercise. She feels that polyphagia is decreasing. She recently had a sleep study and was diagnosed with mild sleep apnea. Recommended to start "auto CPAP".  Subjective:   1. Vitamin D deficiency Alexandria Ward's Vit D level was 31.7 on 07/14/2019. She is currently on prescription Vit D supplementation.  2. Pre-diabetes Alexandria Ward's last A1c was 5.8 on 07/14/2019. She is not currently on anti-diabetic medications.  3. OSA (obstructive sleep apnea) Alexandria Ward was recently diagnosed with mild obstructive sleep apnea. She started auto CPAP. She will follow up with Neurology.   4. Hypothyroidism (acquired) In January 2021 Alexandria Ward's TSH, T4, T3 were stable. She is currently on Amour 15 mg q daily. Her primary care physician is out on medical leave and she requests to have her medication refills here. She was offered a referral to Endocrinology. She will continue with the other provider of the primary care practice.   5. At risk for activity intolerance .Colleene is at risk for exercise intolerance due to weight limitations.  Assessment/Plan:   1. Vitamin D deficiency Low Vitamin D level contributes to fatigue and are associated with obesity, breast, and colon cancer. We will refill prescription Vitamin D for 1 month. Alexandria Ward will follow-up for routine  testing of Vitamin D, at least 2-3 times per year to avoid over-replacement. We will recheck labs in 1 month.  - Vitamin D, Ergocalciferol, (DRISDOL) 1.25 MG (50000 UNIT) CAPS capsule; Take 1 capsule (50,000 Units total) by mouth every 7 (seven) days.  Dispense: 4 capsule; Refill: 0  2. Pre-diabetes Alexandria Ward will continue to work on weight loss, exercise, and decreasing simple carbohydrates to help decrease the risk of diabetes. We will recheck labs in 1 month.   3. OSA (obstructive sleep apnea) Intensive lifestyle modifications are the first line treatment for this issue. We discussed several lifestyle modifications today and she will continue to work on diet, exercise and weight loss efforts. We will continue to monitor. Alexandria Ward is to use her CPAP and will follow up with Neurology. Orders and follow up as documented in patient record.   4. Hypothyroidism (acquired) Patient with long-standing hypothyroidism. She appears euthyroid. Alexandria Ward agreed to continue her current Amour dosage. Orders and follow up as documented in patient record.  5. At risk for activity intolerance Alexandria Ward was given approximately 15 minutes of exercise intolerance counseling today. She is 54 y.o. female and has risk factors exercise intolerance including obesity. We discussed intensive lifestyle modifications today with an emphasis on specific weight loss instructions and strategies. Alexandria Ward will slowly increase activity as tolerated.  Repetitive spaced learning was employed today to elicit superior memory formation and behavioral change.  6. Class 2 severe obesity with serious comorbidity and body mass index (BMI) of 36.0 to 36.9 in adult, unspecified obesity type Alexandria Ward) Alexandria Ward is currently in the action stage of change. As  such, her goal is to continue with weight loss efforts. She has agreed to the Category 1 Plan + 200 calories.   Exercise goals: Alexandria Ward is to increase daily walking.  Behavioral modification strategies: meal  planning and cooking strategies and avoiding temptations.  Alexandria Ward has agreed to follow-up with our clinic in 2 weeks. She was informed of the importance of frequent follow-up visits to maximize her success with intensive lifestyle modifications for her multiple health conditions.   Objective:   Blood pressure 103/70, pulse (!) 102, temperature 98.5 F (36.9 C), temperature source Oral, height 5\' 1"  (1.549 m), weight 192 lb (87.1 kg), SpO2 99 %. Body mass index is 36.28 kg/m.  General: Cooperative, alert, well developed, in no acute distress. HEENT: Conjunctivae and lids unremarkable. Cardiovascular: Regular rhythm.  Lungs: Normal work of breathing. Neurologic: No focal deficits.   Lab Results  Component Value Date   CREATININE 0.76 07/14/2019   BUN 11 07/14/2019   NA 140 07/14/2019   K 4.5 07/14/2019   CL 104 07/14/2019   CO2 23 07/14/2019   Lab Results  Component Value Date   ALT 24 07/14/2019   AST 22 07/14/2019   ALKPHOS 116 07/14/2019   BILITOT 0.3 07/14/2019   Lab Results  Component Value Date   HGBA1C 5.8 (H) 07/14/2019   Lab Results  Component Value Date   INSULIN 12.3 07/14/2019   Lab Results  Component Value Date   TSH 2.790 07/14/2019   Lab Results  Component Value Date   CHOL 204 (H) 07/14/2019   HDL 56 07/14/2019   LDLCALC 134 (H) 07/14/2019   TRIG 79 07/14/2019   Lab Results  Component Value Date   WBC 6.7 07/14/2019   HGB 14.5 07/14/2019   HCT 42.9 07/14/2019   MCV 95 07/14/2019   PLT 335 07/14/2019   No results found for: IRON, TIBC, FERRITIN  Attestation Statements:   Reviewed by clinician on day of visit: allergies, medications, problem list, medical history, surgical history, family history, social history, and previous encounter notes.   I, 07/16/2019, am acting as transcriptionist for Burt Knack, Alexandria Ward.  I have reviewed the above documentation for accuracy and completeness, and I agree with the above. -  Alexandria Quince,  Alexandria Ward

## 2019-09-07 ENCOUNTER — Ambulatory Visit: Payer: Managed Care, Other (non HMO) | Admitting: Cardiology

## 2019-09-08 DIAGNOSIS — R000 Tachycardia, unspecified: Secondary | ICD-10-CM | POA: Diagnosis not present

## 2019-09-08 DIAGNOSIS — R609 Edema, unspecified: Secondary | ICD-10-CM | POA: Diagnosis not present

## 2019-09-08 LAB — COMPREHENSIVE METABOLIC PANEL
Albumin: 3.7 (ref 3.5–5.0)
Calcium: 9.9 (ref 8.7–10.7)
GFR calc Af Amer: 132.98
GFR calc non Af Amer: 109.9
Globulin: 2.9

## 2019-09-08 LAB — BASIC METABOLIC PANEL
BUN: 19 (ref 4–21)
CO2: 29 — AB (ref 13–22)
Chloride: 105 (ref 99–108)
Creatinine: 0.6 (ref 0.5–1.1)
Glucose: 81
Potassium: 4.3 (ref 3.4–5.3)
Sodium: 143 (ref 137–147)

## 2019-09-08 LAB — CBC AND DIFFERENTIAL
HCT: 42 (ref 36–46)
Hemoglobin: 13.6 (ref 12.0–16.0)
Platelets: 297 (ref 150–399)
WBC: 6.1

## 2019-09-08 LAB — HEPATIC FUNCTION PANEL
ALT: 26 (ref 7–35)
AST: 17 (ref 13–35)
Alkaline Phosphatase: 96 (ref 25–125)
Bilirubin, Total: 0.3

## 2019-09-08 LAB — TSH: TSH: 2.7 (ref 0.41–5.90)

## 2019-09-08 LAB — CBC: RBC: 4.39 (ref 3.87–5.11)

## 2019-09-13 DIAGNOSIS — G4733 Obstructive sleep apnea (adult) (pediatric): Secondary | ICD-10-CM | POA: Diagnosis not present

## 2019-09-14 DIAGNOSIS — H02889 Meibomian gland dysfunction of unspecified eye, unspecified eyelid: Secondary | ICD-10-CM | POA: Diagnosis not present

## 2019-09-14 DIAGNOSIS — H04123 Dry eye syndrome of bilateral lacrimal glands: Secondary | ICD-10-CM | POA: Diagnosis not present

## 2019-09-14 DIAGNOSIS — G4733 Obstructive sleep apnea (adult) (pediatric): Secondary | ICD-10-CM | POA: Diagnosis not present

## 2019-09-14 DIAGNOSIS — Z9889 Other specified postprocedural states: Secondary | ICD-10-CM | POA: Diagnosis not present

## 2019-09-14 DIAGNOSIS — H2513 Age-related nuclear cataract, bilateral: Secondary | ICD-10-CM | POA: Diagnosis not present

## 2019-09-19 ENCOUNTER — Ambulatory Visit (INDEPENDENT_AMBULATORY_CARE_PROVIDER_SITE_OTHER): Payer: BC Managed Care – PPO | Admitting: Family Medicine

## 2019-09-19 ENCOUNTER — Other Ambulatory Visit: Payer: Self-pay

## 2019-09-19 ENCOUNTER — Encounter (INDEPENDENT_AMBULATORY_CARE_PROVIDER_SITE_OTHER): Payer: Self-pay | Admitting: Family Medicine

## 2019-09-19 VITALS — BP 83/60 | HR 98 | Temp 97.7°F | Ht 61.0 in | Wt 186.0 lb

## 2019-09-19 DIAGNOSIS — Z6835 Body mass index (BMI) 35.0-35.9, adult: Secondary | ICD-10-CM

## 2019-09-19 DIAGNOSIS — E559 Vitamin D deficiency, unspecified: Secondary | ICD-10-CM

## 2019-09-19 DIAGNOSIS — Z9189 Other specified personal risk factors, not elsewhere classified: Secondary | ICD-10-CM | POA: Diagnosis not present

## 2019-09-19 DIAGNOSIS — R7303 Prediabetes: Secondary | ICD-10-CM | POA: Diagnosis not present

## 2019-09-19 DIAGNOSIS — R000 Tachycardia, unspecified: Secondary | ICD-10-CM

## 2019-09-19 NOTE — Progress Notes (Signed)
Chief Complaint:   OBESITY Alexandria Ward is here to discuss her progress with her obesity treatment plan along with follow-up of her obesity related diagnoses. [name] is on the Category 1 Plan + 200 calories and states she is following her eating plan approximately 97% of the time. [name] states she is doing 0 minutes 0 times per week.  Today's visit was #: 5 Starting weight: 206 lbs Starting date: 07/14/2019 Today's weight: 186 lbs Today's date: 09/19/2019 Total lbs lost to date: 20 Total lbs lost since last in-office visit: 6  Interim History: Alexandria Ward feels that she is getting adequate protein, but she is feeling a little deprived since she is not consuming carbohydrates or sugary snacks.  Subjective:   1. Vitamin D deficiency Alexandria Ward is currently on prescription strength Vit D. He Vit D level on 07/14/2019 was 31.7.  2. Tachycardia Alexandria Ward's heart rate today is 98. I reviewed notes from her last 2 cardiology office visits. She is not on beta blockers.  3. Pre-diabetes Alexandria Ward's A1c on 07/14/2019 was 5.8 and insulin level was 12.3.  4. At risk for osteoporosis Alexandria Ward is at higher risk of osteopenia and osteoporosis due to Vitamin D deficiency.   Assessment/Plan:   1. Vitamin D deficiency Low Vitamin D level contributes to fatigue and are associated with obesity, breast, and colon cancer. Alexandria Ward agreed to continue taking prescription Vitamin D 50,000 IU every week and will follow-up for routine testing of Vitamin D, at least 2-3 times per year to avoid over-replacement. We will check labs today.  - VITAMIN D 25 Hydroxy (Vit-D Deficiency, Fractures)  2. Tachycardia We will check labs today. Chamya was encouraged to slowly increase daily walking.  - Comprehensive metabolic panel  3. Pre-diabetes Alexandria Ward will continue her Category 1 meal plan, and will continue to work on weight loss, exercise, and decreasing simple carbohydrates to help decrease the risk of diabetes. We will check labs  today.  - CBC with Differential/Platelet - Hemoglobin A1c - Insulin, random - Lipid Panel With LDL/HDL Ratio - T4, free - TSH  4. At risk for osteoporosis Alexandria Ward was given approximately 15 minutes of osteoporosis prevention counseling today. Alexandria Ward is at risk for osteopenia and osteoporosis due to her Vitamin D deficiency. She was encouraged to take her Vitamin D and follow her higher calcium diet and increase strengthening exercise to help strengthen her bones and decrease her risk of osteopenia and osteoporosis.  Repetitive spaced learning was employed today to elicit superior memory formation and behavioral change.  5. Class 2 severe obesity with serious comorbidity and body mass index (BMI) of 35.0 to 35.9 in adult, unspecified obesity type Alexandria Ward) Caterine is currently in the action stage of change. As such, her goal is to continue with weight loss efforts. She has agreed to the Category 1 Plan.   Exercise goals: Alexandria Ward is to start walking for 10 minutes per day.  Behavioral modification strategies: better snacking choices.  Alexandria Ward has agreed to follow-up with our clinic in 2 weeks. She was informed of the importance of frequent follow-up visits to maximize her success with intensive lifestyle modifications for her multiple health conditions.   Alexandria Ward was informed we would discuss her lab results at her next visit unless there is a critical issue that needs to be addressed sooner. Alexandria Ward agreed to keep her next visit at the agreed upon time to discuss these results.  Objective:   Blood pressure (!) 83/60, pulse 98, temperature 97.7 F (36.5 C), temperature source  Oral, height 5\' 1"  (1.549 m), weight 186 lb (84.4 kg), SpO2 98 %. Body mass index is 35.14 kg/m.  General: Cooperative, alert, well developed, in no acute distress. HEENT: Conjunctivae and lids unremarkable. Cardiovascular: Regular rhythm.  Lungs: Normal work of breathing. Neurologic: No focal deficits.   Lab Results   Component Value Date   CREATININE 0.76 07/14/2019   BUN 11 07/14/2019   NA 140 07/14/2019   K 4.5 07/14/2019   CL 104 07/14/2019   CO2 23 07/14/2019   Lab Results  Component Value Date   ALT 24 07/14/2019   AST 22 07/14/2019   ALKPHOS 116 07/14/2019   BILITOT 0.3 07/14/2019   Lab Results  Component Value Date   HGBA1C 5.8 (H) 07/14/2019   Lab Results  Component Value Date   INSULIN 12.3 07/14/2019   Lab Results  Component Value Date   TSH 2.790 07/14/2019   Lab Results  Component Value Date   CHOL 204 (H) 07/14/2019   HDL 56 07/14/2019   LDLCALC 134 (H) 07/14/2019   TRIG 79 07/14/2019   Lab Results  Component Value Date   WBC 6.7 07/14/2019   HGB 14.5 07/14/2019   HCT 42.9 07/14/2019   MCV 95 07/14/2019   PLT 335 07/14/2019   No results found for: IRON, TIBC, FERRITIN  Attestation Statements:   Reviewed by clinician on day of visit: allergies, medications, problem list, medical history, surgical history, family history, social history, and previous encounter notes.   I, 07/16/2019, am acting as transcriptionist for Burt Knack, MD.  I have reviewed the above documentation for accuracy and completeness, and I agree with the above. -  Quillian Quince, MD

## 2019-09-20 DIAGNOSIS — E7800 Pure hypercholesterolemia, unspecified: Secondary | ICD-10-CM | POA: Diagnosis not present

## 2019-09-20 DIAGNOSIS — Z0000 Encounter for general adult medical examination without abnormal findings: Secondary | ICD-10-CM | POA: Diagnosis not present

## 2019-09-20 LAB — COMPREHENSIVE METABOLIC PANEL
ALT: 16 IU/L (ref 0–32)
AST: 15 IU/L (ref 0–40)
Albumin/Globulin Ratio: 1.7 (ref 1.2–2.2)
Albumin: 4.1 g/dL (ref 3.8–4.9)
Alkaline Phosphatase: 101 IU/L (ref 39–117)
BUN/Creatinine Ratio: 27 — ABNORMAL HIGH (ref 9–23)
BUN: 19 mg/dL (ref 6–24)
Bilirubin Total: 0.2 mg/dL (ref 0.0–1.2)
CO2: 23 mmol/L (ref 20–29)
Calcium: 9.8 mg/dL (ref 8.7–10.2)
Chloride: 106 mmol/L (ref 96–106)
Creatinine, Ser: 0.7 mg/dL (ref 0.57–1.00)
GFR calc Af Amer: 113 mL/min/{1.73_m2} (ref >59)
GFR calc non Af Amer: 98 mL/min/{1.73_m2} (ref >59)
Globulin, Total: 2.4 g/dL (ref 1.5–4.5)
Glucose: 76 mg/dL (ref 65–99)
Potassium: 4.4 mmol/L (ref 3.5–5.2)
Sodium: 142 mmol/L (ref 134–144)
Total Protein: 6.5 g/dL (ref 6.0–8.5)

## 2019-09-20 LAB — CBC WITH DIFFERENTIAL/PLATELET
Basophils Absolute: 0.1 10*3/uL (ref 0.0–0.2)
Basos: 1 %
EOS (ABSOLUTE): 0.1 10*3/uL (ref 0.0–0.4)
Eos: 2 %
Hematocrit: 43.2 % (ref 34.0–46.6)
Hemoglobin: 14 g/dL (ref 11.1–15.9)
Immature Grans (Abs): 0 10*3/uL (ref 0.0–0.1)
Immature Granulocytes: 0 %
Lymphocytes Absolute: 1.7 10*3/uL (ref 0.7–3.1)
Lymphs: 28 %
MCH: 31.7 pg (ref 26.6–33.0)
MCHC: 32.4 g/dL (ref 31.5–35.7)
MCV: 98 fL — ABNORMAL HIGH (ref 79–97)
Monocytes Absolute: 0.7 10*3/uL (ref 0.1–0.9)
Monocytes: 11 %
Neutrophils Absolute: 3.5 10*3/uL (ref 1.4–7.0)
Neutrophils: 58 %
Platelets: 295 10*3/uL (ref 150–450)
RBC: 4.42 x10E6/uL (ref 3.77–5.28)
RDW: 13.4 % (ref 11.7–15.4)
WBC: 6 10*3/uL (ref 3.4–10.8)

## 2019-09-20 LAB — LIPID PANEL WITH LDL/HDL RATIO
Cholesterol, Total: 179 mg/dL (ref 100–199)
HDL: 44 mg/dL (ref >39)
LDL Chol Calc (NIH): 123 mg/dL — ABNORMAL HIGH (ref 0–99)
LDL/HDL Ratio: 2.8 ratio (ref 0.0–3.2)
Triglycerides: 65 mg/dL (ref 0–149)
VLDL Cholesterol Cal: 12 mg/dL (ref 5–40)

## 2019-09-20 LAB — HEMOGLOBIN A1C
Est. average glucose Bld gHb Est-mCnc: 114 mg/dL
Hgb A1c MFr Bld: 5.6 % (ref 4.8–5.6)

## 2019-09-20 LAB — VITAMIN D 25 HYDROXY (VIT D DEFICIENCY, FRACTURES): Vit D, 25-Hydroxy: 77.4 ng/mL (ref 30.0–100.0)

## 2019-09-20 LAB — TSH: TSH: 2.63 u[IU]/mL (ref 0.450–4.500)

## 2019-09-20 LAB — T4, FREE: Free T4: 1.37 ng/dL (ref 0.82–1.77)

## 2019-09-20 LAB — INSULIN, RANDOM: INSULIN: 4.8 u[IU]/mL (ref 2.6–24.9)

## 2019-10-05 ENCOUNTER — Ambulatory Visit (INDEPENDENT_AMBULATORY_CARE_PROVIDER_SITE_OTHER): Payer: BC Managed Care – PPO | Admitting: Family Medicine

## 2019-10-05 ENCOUNTER — Encounter (INDEPENDENT_AMBULATORY_CARE_PROVIDER_SITE_OTHER): Payer: Self-pay | Admitting: Family Medicine

## 2019-10-05 ENCOUNTER — Other Ambulatory Visit: Payer: Self-pay

## 2019-10-05 VITALS — BP 93/64 | HR 103 | Temp 97.9°F | Ht 65.0 in | Wt 184.0 lb

## 2019-10-05 DIAGNOSIS — R7303 Prediabetes: Secondary | ICD-10-CM | POA: Diagnosis not present

## 2019-10-05 DIAGNOSIS — E7849 Other hyperlipidemia: Secondary | ICD-10-CM | POA: Diagnosis not present

## 2019-10-05 DIAGNOSIS — E669 Obesity, unspecified: Secondary | ICD-10-CM

## 2019-10-05 DIAGNOSIS — E559 Vitamin D deficiency, unspecified: Secondary | ICD-10-CM

## 2019-10-05 DIAGNOSIS — Z9189 Other specified personal risk factors, not elsewhere classified: Secondary | ICD-10-CM

## 2019-10-05 DIAGNOSIS — Z6834 Body mass index (BMI) 34.0-34.9, adult: Secondary | ICD-10-CM

## 2019-10-05 MED ORDER — VITAMIN D (ERGOCALCIFEROL) 1.25 MG (50000 UNIT) PO CAPS: 50000.0000 [IU] | ORAL_CAPSULE | ORAL | 0 refills | Status: DC

## 2019-10-06 NOTE — Progress Notes (Signed)
Chief Complaint:   OBESITY Alexandria Ward is here to discuss her progress with her obesity treatment plan along with follow-up of her obesity related diagnoses. Alexandria Ward is on the Category 1 Plan and states she is following her eating plan approximately 95% of the time. Alexandria Ward states she is being more mindful of movement.   Today's visit was #: 6 Starting weight: 206 lbs Starting date: 07/14/2019 Today's weight: 184 lbs Today's date: 10/05/2019 Total lbs lost to date: 22 Total lbs lost since last in-office visit: 2  Interim History: Alexandria Ward continues to do well with weight loss on her Category 1 plan. Her hunger is mostly controlled. She will be having company soon and is concerned about how to stay on track.  Subjective:   1. Vitamin D deficiency Alexandria Ward's Vit D level is now at goal. She is at risk of over-replacement. She denies nausea, vomiting, or muscle weakness. I discussed labs with the patient today.  2. Pre-diabetes Alexandria Ward's A1c has improved with diet and weight loss. She notes decreased polyphagia, and denies hypoglycemia. I discussed labs with the patient today.  3. Other hyperlipidemia Alexandria Ward's LDL is improving but it is not yet at goal. She denies chest pain. I discussed labs with the patient today.  4. At risk for heart disease Alexandria Ward is at a higher than average risk for cardiovascular disease due to obesity.   Assessment/Plan:   1. Vitamin D deficiency Low Vitamin D level contributes to fatigue and are associated with obesity, breast, and colon cancer. We will refill prescription Vitamin D 50,000 IU for 2 month. She is to take 1 capsule every 14 days. [name] will follow-up for routine testing of Vitamin D, at least 2-3 times per year to avoid over-replacement.  - Vitamin D, Ergocalciferol, (DRISDOL) 1.25 MG (50000 UNIT) CAPS capsule; Take 1 capsule (50,000 Units total) by mouth every 14 days (fourteen) days.  Dispense: 4 capsule; Refill: 0  2. Pre-diabetes [name] will continue to  work on weight loss, diet, exercise, and decreasing simple carbohydrates to help decrease the risk of diabetes. Great job!  3. Other hyperlipidemia Cardiovascular risk and specific lipid/LDL goals reviewed. We discussed several lifestyle modifications today and Briggitte will continue to work on diet, exercise and weight loss efforts. Orders and follow up as documented in patient record.   4. At risk for heart disease Alexandria Ward was given approximately 15 minutes of coronary artery disease prevention counseling today. She is 54 y.o. female and has risk factors for heart disease including obesity. We discussed intensive lifestyle modifications today with an emphasis on specific weight loss instructions and strategies.   Repetitive spaced learning was employed today to elicit superior memory formation and behavioral change.  5. Class 1 obesity with serious comorbidity and body mass index (BMI) of 34.0 to 34.9 in adult, unspecified obesity type Alexandria Ward is currently in the action stage of change. As such, her goal is to continue with weight loss efforts. She has agreed to the Category 1 Plan.   Exercise goals: As is.  Behavioral modification strategies: increasing lean protein intake, meal planning and cooking strategies and celebration eating strategies.  Alexandria Ward has agreed to follow-up with our clinic in 2 to 3 weeks. She was informed of the importance of frequent follow-up visits to maximize her success with intensive lifestyle modifications for her multiple health conditions.   Objective:   Blood pressure 93/64, pulse (!) 103, temperature 97.9 F (36.6 C), temperature source Oral, height 5\' 5"  (1.651 m), weight 184  lb (83.5 kg), SpO2 97 %. Body mass index is 30.62 kg/m.  General: Cooperative, alert, well developed, in no acute distress. HEENT: Conjunctivae and lids unremarkable. Cardiovascular: Regular rhythm.  Lungs: Normal work of breathing. Neurologic: No focal deficits.   Lab Results    Component Value Date   CREATININE 0.70 09/19/2019   BUN 19 09/19/2019   NA 142 09/19/2019   K 4.4 09/19/2019   CL 106 09/19/2019   CO2 23 09/19/2019   Lab Results  Component Value Date   ALT 16 09/19/2019   AST 15 09/19/2019   ALKPHOS 101 09/19/2019   BILITOT 0.2 09/19/2019   Lab Results  Component Value Date   HGBA1C 5.6 09/19/2019   HGBA1C 5.8 (H) 07/14/2019   Lab Results  Component Value Date   INSULIN 4.8 09/19/2019   INSULIN 12.3 07/14/2019   Lab Results  Component Value Date   TSH 2.630 09/19/2019   Lab Results  Component Value Date   CHOL 179 09/19/2019   HDL 44 09/19/2019   LDLCALC 123 (H) 09/19/2019   TRIG 65 09/19/2019   Lab Results  Component Value Date   WBC 6.0 09/19/2019   HGB 14.0 09/19/2019   HCT 43.2 09/19/2019   MCV 98 (H) 09/19/2019   PLT 295 09/19/2019   No results found for: IRON, TIBC, FERRITIN  Attestation Statements:   Reviewed by clinician on day of visit: allergies, medications, problem list, medical history, surgical history, family history, social history, and previous encounter notes.   I, Burt Knack, am acting as transcriptionist for Quillian Quince, MD.  I have reviewed the above documentation for accuracy and completeness, and I agree with the above. -  Quillian Quince, MD

## 2019-10-11 DIAGNOSIS — D1801 Hemangioma of skin and subcutaneous tissue: Secondary | ICD-10-CM | POA: Diagnosis not present

## 2019-10-11 DIAGNOSIS — L814 Other melanin hyperpigmentation: Secondary | ICD-10-CM | POA: Diagnosis not present

## 2019-10-11 DIAGNOSIS — L820 Inflamed seborrheic keratosis: Secondary | ICD-10-CM | POA: Diagnosis not present

## 2019-10-11 DIAGNOSIS — D2272 Melanocytic nevi of left lower limb, including hip: Secondary | ICD-10-CM | POA: Diagnosis not present

## 2019-10-11 DIAGNOSIS — L821 Other seborrheic keratosis: Secondary | ICD-10-CM | POA: Diagnosis not present

## 2019-10-14 DIAGNOSIS — G4733 Obstructive sleep apnea (adult) (pediatric): Secondary | ICD-10-CM | POA: Diagnosis not present

## 2019-10-14 DIAGNOSIS — H0100B Unspecified blepharitis left eye, upper and lower eyelids: Secondary | ICD-10-CM | POA: Diagnosis not present

## 2019-10-14 DIAGNOSIS — H04123 Dry eye syndrome of bilateral lacrimal glands: Secondary | ICD-10-CM | POA: Diagnosis not present

## 2019-10-14 DIAGNOSIS — H0100A Unspecified blepharitis right eye, upper and lower eyelids: Secondary | ICD-10-CM | POA: Diagnosis not present

## 2019-10-19 ENCOUNTER — Encounter (INDEPENDENT_AMBULATORY_CARE_PROVIDER_SITE_OTHER): Payer: Self-pay | Admitting: Family Medicine

## 2019-10-19 ENCOUNTER — Ambulatory Visit (INDEPENDENT_AMBULATORY_CARE_PROVIDER_SITE_OTHER): Payer: BC Managed Care – PPO | Admitting: Family Medicine

## 2019-10-19 ENCOUNTER — Other Ambulatory Visit: Payer: Self-pay

## 2019-10-19 VITALS — BP 106/73 | HR 93 | Temp 98.2°F | Ht 61.0 in | Wt 181.0 lb

## 2019-10-19 DIAGNOSIS — R7303 Prediabetes: Secondary | ICD-10-CM

## 2019-10-19 DIAGNOSIS — E669 Obesity, unspecified: Secondary | ICD-10-CM | POA: Diagnosis not present

## 2019-10-19 DIAGNOSIS — Z6834 Body mass index (BMI) 34.0-34.9, adult: Secondary | ICD-10-CM | POA: Diagnosis not present

## 2019-10-19 NOTE — Progress Notes (Signed)
Chief Complaint:   OBESITY Alexandria Ward is here to discuss her progress with her obesity treatment plan along with follow-up of her obesity related diagnoses. Alexandria Ward is on the Category 1 Plan and states she is following her eating plan approximately 96% of the time. Alexandria Ward states she is doing 0 minutes 0 times per week.  Today's visit was #: 7 Starting weight: 206 lbs Starting date: 07/14/2019 Today's weight: 181 lbs Today's date: 10/19/2019 Total lbs lost to date: 25 Total lbs lost since last in-office visit: 3  Interim History: Alexandria Ward continues to do well with weight loss on her Category 1 plan. Her plans changed and she didn't have the company that she expected, so she didn't have extra temptations. She notes some increased hunger at times.  Subjective:   1. Pre-diabetes Alexandria Ward notes some increased polyphagia, but this improved with increased protein in her diet. She denies hypoglycemia.  Assessment/Plan:   1. Pre-diabetes Alexandria Ward will continue to work on weight loss, diet, exercise, and decreasing simple carbohydrates to help decrease the risk of diabetes. We will recheck labs in 1 month.  2. Class 1 obesity with serious comorbidity and body mass index (BMI) of 34.0 to 34.9 in adult, unspecified obesity type Alexandria Ward is currently in the action stage of change. As such, her goal is to continue with weight loss efforts. She has agreed to the Category 1 Plan.   Behavioral modification strategies: increasing lean protein intake, meal planning and cooking strategies and better snacking choices.  Alexandria Ward has agreed to follow-up with our clinic in 2 to 3 weeks. She was informed of the importance of frequent follow-up visits to maximize her success with intensive lifestyle modifications for her multiple health conditions.   Objective:   Blood pressure 106/73, pulse 93, temperature 98.2 F (36.8 C), temperature source Oral, height 5\' 1"  (1.549 m), weight 181 lb (82.1 kg), SpO2 97 %. Body mass index  is 34.2 kg/m.  General: Cooperative, alert, well developed, in no acute distress. HEENT: Conjunctivae and lids unremarkable. Cardiovascular: Regular rhythm.  Lungs: Normal work of breathing. Neurologic: No focal deficits.   Lab Results  Component Value Date   CREATININE 0.70 09/19/2019   BUN 19 09/19/2019   NA 142 09/19/2019   K 4.4 09/19/2019   CL 106 09/19/2019   CO2 23 09/19/2019   Lab Results  Component Value Date   ALT 16 09/19/2019   AST 15 09/19/2019   ALKPHOS 101 09/19/2019   BILITOT 0.2 09/19/2019   Lab Results  Component Value Date   HGBA1C 5.6 09/19/2019   HGBA1C 5.8 (H) 07/14/2019   Lab Results  Component Value Date   INSULIN 4.8 09/19/2019   INSULIN 12.3 07/14/2019   Lab Results  Component Value Date   TSH 2.630 09/19/2019   Lab Results  Component Value Date   CHOL 179 09/19/2019   HDL 44 09/19/2019   LDLCALC 123 (H) 09/19/2019   TRIG 65 09/19/2019   Lab Results  Component Value Date   WBC 6.0 09/19/2019   HGB 14.0 09/19/2019   HCT 43.2 09/19/2019   MCV 98 (H) 09/19/2019   PLT 295 09/19/2019   No results found for: IRON, TIBC, FERRITIN  Attestation Statements:   Reviewed by clinician on day of visit: allergies, medications, problem list, medical history, surgical history, family history, social history, and previous encounter notes.  Time spent on visit including pre-visit chart review and post-visit care and charting was 21 minutes.    11/19/2019  Hassell Done, am acting as Location manager for Dennard Nip, MD.  I have reviewed the above documentation for accuracy and completeness, and I agree with the above. -  Dennard Nip, MD

## 2019-11-01 ENCOUNTER — Ambulatory Visit: Payer: Self-pay | Admitting: Neurology

## 2019-11-09 ENCOUNTER — Ambulatory Visit (INDEPENDENT_AMBULATORY_CARE_PROVIDER_SITE_OTHER): Payer: BC Managed Care – PPO | Admitting: Family Medicine

## 2019-11-10 ENCOUNTER — Ambulatory Visit (INDEPENDENT_AMBULATORY_CARE_PROVIDER_SITE_OTHER): Payer: BC Managed Care – PPO | Admitting: Family Medicine

## 2019-11-10 ENCOUNTER — Encounter (INDEPENDENT_AMBULATORY_CARE_PROVIDER_SITE_OTHER): Payer: Self-pay | Admitting: Family Medicine

## 2019-11-10 ENCOUNTER — Encounter (INDEPENDENT_AMBULATORY_CARE_PROVIDER_SITE_OTHER): Payer: Self-pay

## 2019-11-10 ENCOUNTER — Other Ambulatory Visit: Payer: Self-pay

## 2019-11-10 VITALS — BP 105/73 | HR 92 | Temp 98.9°F | Ht 61.0 in | Wt 177.0 lb

## 2019-11-10 DIAGNOSIS — E559 Vitamin D deficiency, unspecified: Secondary | ICD-10-CM | POA: Diagnosis not present

## 2019-11-10 DIAGNOSIS — Z6833 Body mass index (BMI) 33.0-33.9, adult: Secondary | ICD-10-CM

## 2019-11-10 DIAGNOSIS — G43809 Other migraine, not intractable, without status migrainosus: Secondary | ICD-10-CM

## 2019-11-10 DIAGNOSIS — E669 Obesity, unspecified: Secondary | ICD-10-CM | POA: Diagnosis not present

## 2019-11-10 DIAGNOSIS — G43909 Migraine, unspecified, not intractable, without status migrainosus: Secondary | ICD-10-CM | POA: Insufficient documentation

## 2019-11-10 NOTE — Progress Notes (Signed)
.     Chief Complaint:   OBESITY Alexandria Ward is here to discuss her progress with her obesity treatment plan along with follow-up of her obesity related diagnoses. Alexandria Ward is on the Category 1 Plan and states she is following her eating plan approximately 95-96% of the time. Alexandria Ward states she is being more mindful of her steps.  Today's visit was #: 8 Starting weight: 206 lbs Starting date: 07/14/2019 Today's weight: 177 lbs Today's date: 11/10/2019 Total lbs lost to date: 29 Total lbs lost since last in-office visit: 4  Interim History: Alexandria Ward has found success with changing dinner and lunch meals times, which has made it easier for her to consume all of the food on the plan.  Subjective:   1. Vitamin D deficiency Alexandria Ward Vit D level on 09/19/2019 was 77.4. She has been on bi-weekly prescription Vit D 50,000 IU. She denies nausea or vomiting.  2. Other migraine without status migrainosus, not intractable Alexandria Ward has been experiencing increased frequency of migraine symptoms. She is on amitrityline 50 mg qhs and zolmitriptan 5 mg as needed.  Assessment/Plan:   1. Vitamin D deficiency Low Vitamin D level contributes to fatigue and are associated with obesity, breast, and colon cancer. Alexandria Ward agreed to discontinue prescription Vitamin D, and start OTC Vit D3 5,000 IU once daily. She will follow-up for routine testing of Vitamin D, at least 2-3 times per year to avoid over-replacement. We will recheck labs in July 2021.  2. Other migraine without status migrainosus, not intractable Alexandria Ward is to increase her water intake, and will follow up as directed.  3. Class 1 obesity with serious comorbidity and body mass index (BMI) of 33.0 to 33.9 in adult, unspecified obesity type Alexandria Ward is currently in the action stage of change. As such, her goal is to continue with weight loss efforts. She has agreed to the Category 1 Plan.   Handout provided today: Additional Breakfast Options and Additional Lunch  Options.  Exercise goals: As is, I recommended zumba on YouTube.  Behavioral modification strategies: increasing lean protein intake, increasing water intake, meal planning and cooking strategies and celebration eating strategies.  Alexandria Ward has agreed to follow-up with our clinic in 2 to 3 weeks. She was informed of the importance of frequent follow-up visits to maximize her success with intensive lifestyle modifications for her multiple health conditions.   Objective:   Blood pressure 105/73, pulse 92, temperature 98.9 F (37.2 C), temperature source Oral, height 5\' 1"  (1.549 m), weight 177 lb (80.3 kg), SpO2 100 %. Body mass index is 33.44 kg/m.  General: Cooperative, alert, well developed, in no acute distress. HEENT: Conjunctivae and lids unremarkable. Cardiovascular: Regular rhythm.  Lungs: Normal work of breathing. Neurologic: No focal deficits.   Lab Results  Component Value Date   CREATININE 0.70 09/19/2019   BUN 19 09/19/2019   NA 142 09/19/2019   K 4.4 09/19/2019   CL 106 09/19/2019   CO2 23 09/19/2019   Lab Results  Component Value Date   ALT 16 09/19/2019   AST 15 09/19/2019   ALKPHOS 101 09/19/2019   BILITOT 0.2 09/19/2019   Lab Results  Component Value Date   HGBA1C 5.6 09/19/2019   HGBA1C 5.8 (H) 07/14/2019   Lab Results  Component Value Date   INSULIN 4.8 09/19/2019   INSULIN 12.3 07/14/2019   Lab Results  Component Value Date   TSH 2.630 09/19/2019   Lab Results  Component Value Date   CHOL 179 09/19/2019   HDL  44 09/19/2019   LDLCALC 123 (H) 09/19/2019   TRIG 65 09/19/2019   Lab Results  Component Value Date   WBC 6.0 09/19/2019   HGB 14.0 09/19/2019   HCT 43.2 09/19/2019   MCV 98 (H) 09/19/2019   PLT 295 09/19/2019   No results found for: IRON, TIBC, FERRITIN  Attestation Statements:   Reviewed by clinician on day of visit: allergies, medications, problem list, medical history, surgical history, family history, social history, and  previous encounter notes.  Time spent on visit including pre-visit chart review and post-visit care and charting was 42 minutes.    I, Burt Knack, am acting as transcriptionist for Quillian Quince, MD.  I have reviewed the above documentation for accuracy and completeness, and I agree with the above. -  Quillian Quince, MD

## 2019-11-13 DIAGNOSIS — G4733 Obstructive sleep apnea (adult) (pediatric): Secondary | ICD-10-CM | POA: Diagnosis not present

## 2019-11-28 ENCOUNTER — Encounter: Payer: Self-pay | Admitting: Neurology

## 2019-11-28 ENCOUNTER — Ambulatory Visit: Payer: BC Managed Care – PPO | Admitting: Neurology

## 2019-11-28 VITALS — BP 101/68 | HR 90 | Ht 61.0 in | Wt 183.0 lb

## 2019-11-28 DIAGNOSIS — R0600 Dyspnea, unspecified: Secondary | ICD-10-CM

## 2019-11-28 DIAGNOSIS — G478 Other sleep disorders: Secondary | ICD-10-CM | POA: Diagnosis not present

## 2019-11-28 DIAGNOSIS — R002 Palpitations: Secondary | ICD-10-CM | POA: Diagnosis not present

## 2019-11-28 DIAGNOSIS — R0609 Other forms of dyspnea: Secondary | ICD-10-CM

## 2019-11-28 DIAGNOSIS — E039 Hypothyroidism, unspecified: Secondary | ICD-10-CM

## 2019-11-28 MED ORDER — ZOLPIDEM TARTRATE ER 6.25 MG PO TBCR: 6.2500 mg | EXTENDED_RELEASE_TABLET | Freq: Every evening | ORAL | 0 refills | Status: DC | PRN

## 2019-11-28 NOTE — Progress Notes (Addendum)
SLEEP MEDICINE CLINIC    Provider:  Larey Seat, MD  Primary Care Physician:  Jani Gravel, [address] [place] [address]     Referring Provider: Jani Gravel, [address] [place],  [place] [address]          Chief Complaint according to patient   Patient presents with:    . New Patient (Initial Visit)     complaints of feeling tired all the time, their dentist suggested that she consider checking out OSA. she does know that she snores. she has had significant weight gain. had a SS several yrs ago but was never advised of apnea being a concern. She remembers her PSG  Was in-lab and she barely fell asleep.       HISTORY OF PRESENT ILLNESS:  Alexandria Ward is a 54 year- old Caucasian female patient seen in a RV on 11/28/2019.  I had the pleasure of seeing her in consultation 1/ 12 /2021 and underwent 2 sleep studies. When I first met Alexandria Ward in January she had a reported history was in 6 months prior to our appointment of vertigo and dizziness she had been seen by vestibular rehab and PT.  She developed ankle edema and swelling and she had reported quite a significant steady weight gain.  We discussed referral to healthy weight and wellness service of [place].  She enrolled and in the meantime has lost 29 pounds! Her insurance company did not permit [place] to do an attended sleep study also she had a history of parasomnia and vivid dreams.  She underwent a home sleep test on 10 August 2019. Mild obstructive sleep apnea was documented with an overall  Apnea- Hypopnea Index/ hour of sleep (AHI) of 9.2/h and strong  REM sleep dependency. The REM sleep AHI was 32.3/h, three times  the overall AHI. There was also supine sleep position dependency.  Herat rate variability was within normal range and no clinically  significant hypoxemia was noted. Moderately loud snoring was  indicated by RDI.    Unfortunately hospital refused data to  reviewed only 6 nights but in the last 3 months.  The user time for those exceptional nights were 3 hours and 41 minutes on average the AutoSet is set between a minimum pressure of 5 and a maximum pressure of 15 was 3 cm EPR and the residual AHI is 1.7 given that she had a mild apnea to begin with this is still a significant reduction in apnea counts.  No central apneas are noted and the air leak is moderate the 95th percentile pressure is only 7.9 cmH2O.  Also she wore a retainer and bite guard. She woke with migraines.   It may help if we are reducing the upper pressure window for the patient. She feels that it was too much pressure.       I have the pleasure of seeing Alexandria Ward today, a right -handed Caucasian female with a possible sleep disorder.  She  has a past medical history of Anemia, Asthma, Back pain, Constipation, Depression, Dyspnea, Fatigue, Fluid retention, Hyperlipidemia, Hypothyroidism, IBS (irritable bowel syndrome), Joint pain, Lower extremity edema, Migraines, Palpitations, Shortness of breath, Tachycardia, Thyroid disease, Vitamin B 12 deficiency, and Vitamin D deficiency.. She is taking synthroid. But developed tachycardia, ankle edema.and hypotension with Vertigo on BP medication.  She gained 50 pounds , is menopausal. Was diagnosed with hypothyroidism 12 month ago.  She has seen  Dr. [name] , who did a (negative) cortisol test.     Sleep relevant medical history:  Family medical /sleep history: maternal Aunt had a CPAP machine, passed away.   Social history:  Patient is working as a Biomedical engineer - IT- and lives in a household alone. Family status is single , without children. Her mother lives across the street.  The patient currently works from home. Pets are present. 2 poodles. Tobacco use never .  ETOH use ; rare , Caffeine intake in form of Coffee( 1 mug in AM ) Soda( none ) Tea ( at lunch ) or energy drinks. Regular exercise- interrupted by Covid.    Sleep habits are  as follows: Lunch is with her mom, generally the biggest meal. The patient's dinner time is between 8-8.30 PM. She eats a light dinner. She often falls asleep at her moms watching TV.  Returns to her house- has GERD- takes amitriptyline. She wears a mouth guard. She sis a mouth breather.  The patient goes to bed at 12- PM to 2 AM. She continues to sleep for 3 hours, wakes for 2 bathroom breaks, the first time at 4 AM.   The preferred sleep position is right lateral- , with the support of 2 pillows, the bed is flat- she prefers a recliner.  pillows. Dreams are reportedly infrequent.  7 AM is the usual rise time. The patient wakes up with several  Alarms  At 6.30-- 6.45 AM .   She reports not feeling refreshed or restored in AM, with symptoms such as dry mouth, morning headaches,  and residual fatigue. Naps are taken  lasting several hours and are more refreshing than nocturnal sleep.    Review of Systems: Out of a complete 14 system review, the patient complains of only the following symptoms, and all other reviewed systems are negative.: 2/ 2021 :  Chief concern according to patient :  Non restorative sleep, weight gain and fatigue. She gained 50 pounds , is menopausal. Was diagnosed with hypothyroidism 12 month ago.   Fatigue, sleepiness , snoring, fragmented sleep, Insomnia - nocturia,dry mouth.    How likely are you to doze in the following situations: 0 = not likely, 1 = slight chance, 2 = moderate chance, 3 = high chance   Sitting and Reading? Watching Television? Sitting inactive in a public place (theater or meeting)? As a passenger in a car for an hour without a break? Lying down in the afternoon when circumstances permit? Sitting and talking to someone? Sitting quietly after lunch without alcohol? In a car, while stopped for a few minutes in traffic?   Total = 12/ 24 points   FSS endorsed at 52/ 63 points.  GDS  : she loves her job, feels happy, not overworked.  Worried about her  mother.   Social History   Socioeconomic History  . Marital status: Single    Spouse name: Not on file  . Number of children: 0  . Years of education: Not on file  . Highest education level: Not on file  Occupational History  . Occupation: Market researcher  Tobacco Use  . Smoking status: Never Smoker  . Smokeless tobacco: Never Used  Vaping Use  . Vaping Use: Never used  Substance and Sexual Activity  . Alcohol use: Yes    Comment: rarely  . Drug use: Never  . Sexual activity: Not on file  Other Topics Concern  . Not on file  Social History Narrative  .  Not on file   Social Determinants of Health   Financial Resource Strain:   . Difficulty of Paying Living Expenses:   Food Insecurity:   . Worried About Programme researcher, broadcasting/film/video in the Last Year:   . Barista in the Last Year:   Transportation Needs:   . Freight forwarder (Medical):   Marland Kitchen Lack of Transportation (Non-Medical):   Physical Activity:   . Days of Exercise per Week:   . Minutes of Exercise per Session:   Stress:   . Feeling of Stress :   Social Connections:   . Frequency of Communication with Friends and Family:   . Frequency of Social Gatherings with Friends and Family:   . Attends Religious Services:   . Active Member of Clubs or Organizations:   . Attends Banker Meetings:   Marland Kitchen Marital Status:     Family History  Problem Relation Age of Onset  . Restless legs syndrome Mother   . Arthritis Mother   . Neuropathy Mother   . Hyperlipidemia Mother   . Depression Mother   . Heart attack Father     Past Medical History:  Diagnosis Date  . Anemia   . Asthma   . Back pain   . Constipation   . Depression   . Dyspnea   . Fatigue   . Fluid retention   . Hyperlipidemia   . Hypothyroidism   . IBS (irritable bowel syndrome)   . Joint pain   . Lower extremity edema   . Migraines   . Palpitations   . Shortness of breath   . Tachycardia   . Thyroid disease   .  Vitamin B 12 deficiency   . Vitamin D deficiency     Past Surgical History:  Procedure Laterality Date  . APPENDECTOMY    . endometrial ablasion       Current Outpatient Medications on File Prior to Visit  Medication Sig Dispense Refill  . amitriptyline (ELAVIL) 50 MG tablet Take 50 mg by mouth at bedtime.   4  . Carboxymethylcellulose Sod PF (REFRESH CELLUVISC) 1 % GEL Place 1 application into both eyes as needed.    . Cholecalciferol (VITAMIN D3) 125 MCG (5000 UT) CAPS Take 1 capsule by mouth daily.    . fexofenadine (ALLEGRA) 180 MG tablet Take by mouth.    . fluconazole (DIFLUCAN) 150 MG tablet Take 1 tablet by mouth as needed.    . fluticasone (FLONASE) 50 MCG/ACT nasal spray Place into the nose.    Marland Kitchen MAGNESIUM MALATE PO Take 1 tablet by mouth daily.    Marland Kitchen thyroid (NP THYROID) 15 MG tablet Take 15 mg by mouth daily.    Marland Kitchen zolmitriptan (ZOMIG) 5 MG tablet Take 5 mg by mouth as needed for migraine.     No current facility-administered medications on file prior to visit.    Allergies  Allergen Reactions  . Betadine [Povidone Iodine]   . Azithromycin Rash  . Ciprofloxacin Rash  . Penicillins Rash    Cant remember the reaction or the severity of it  . Tape Rash    Physical exam:  Today's Vitals   11/28/19 1538  BP: 101/68  Pulse: 90  Weight: 183 lb (83 kg)  Height: 5\' 1"  (1.549 m)   Body mass index is 34.58 kg/m.   Wt Readings from Last 3 Encounters:  11/28/19 183 lb (83 kg)  11/10/19 177 lb (80.3 kg)  10/19/19 181 lb (82.1 kg)  Ht Readings from Last 3 Encounters:  11/28/19 $RemoveB'5\' 1"'JfKodpZe$  (1.549 m)  11/10/19 $RemoveB'5\' 1"'[id number]$  (1.549 m)  10/19/19 $RemoveB'5\' 1"'[id number]$  (1.549 m)      General: The patient is awake, alert and appears not in acute distress. The patient is well groomed. Head: Normocephalic, atraumatic. Neck is supple. Mallampati 4,  neck circumference: 16. 25  inches . Nasal airflow patent.  Retrognathia is not  seen.  Dental status: intact  Cardiovascular:  Regular rate and  cardiac rhythm by pulse, without distended neck veins. Respiratory: Lungs are clear to auscultation.  Skin:  Evidence of ankle edema, but no rash. Trunk: The patient's posture is erect.   Neurologic exam : The patient is awake and alert, oriented to place and time.   Memory subjective described as intact.  Speech is fluent,  without  dysarthria, dysphonia or aphasia.  Mood and affect are appropriate.   Cranial nerves: no loss of smell or taste reported  Pupils are equal and briskly reactive to light. Funduscopic exam deferred..  Extraocular movements in vertical and horizontal planes were intact and without nystagmus. No Diplopia.  Facial motor strength is symmetric and tongue and uvula move midline.  Neck ROM : rotation, tilt and flexion extension were normal for age and shoulder shrug was symmetrical.    Motor exam:  Symmetric bulk, tone and ROM.   Normal tone  .   Sensory:  Fine touch, pinprick and vibration were tested  and  normal.  Proprioception tested in the upper extremities was normal.   Coordination: Rapid alternating movements in the fingers/hands were of normal speed.  The Finger-to-nose maneuver was intact without evidence of ataxia, dysmetria or tremor.   Gait and station: Patient could rise unassisted from a seated position, walked without assistive device.  Toe and heel walk were deferred.  Deep tendon reflexes: in the upper and lower extremities are symmetrically attenuated and intact.  Babinski response was deferred .      After spending a total time of 20 minutes face to face including additional time for physical and neurologic examination, review of laboratory studies,  personal review of imaging studies, reports and results of other testing and review of referral information / records as far as provided in visit, I have established the following assessments:    1) Alexandria Ward is still daytime sleepy- she felt the pressure of CPAP is too high, and she felt  bothered by CPAP.  we will reduce the pressure and give a long RAMP.  2) she has trouble to achieve high compliance. Currently hasn't used the machine more than 6 days in 90 days.  3) she is using a dream wear- she would like to change to a bella swift . Ear loops for better stablity.     I suggested to reduce the heated humidity.    My Plan is to proceed with:   I would like to thank Jani Gravel, MD , and Dr. Dennard Nip, MD,   [address] [place],  [place] [address] for allowing me to meet with and to take care of this pleasant patient.     I plan to follow up either personally or through our NP within 3 month.    Electronically signed by: Larey Seat, MD 11/28/2019 3:55 PM  [facility] Board certified by The [employer] of Sleep Medicine and Diplomate of the [employer] of Sleep Medicine. Board certified In Neurology through the [place], Fellow of the [employer]  of Neurology. Medical Director of [facility].

## 2019-11-28 NOTE — Patient Instructions (Addendum)
3) She is using a halo mask type, Dream wear - she would like to change to a bella swift . Ear loops for better stablity.  Please see changes in settings, pressure and RAMP time.    CPAP and BPAP Information CPAP and BPAP are methods of helping a person breathe with the use of air pressure. CPAP stands for "continuous positive airway pressure." BPAP stands for "bi-level positive airway pressure." In both methods, air is blown through your nose or mouth and into your air passages to help you breathe well. CPAP and BPAP use different amounts of pressure to blow air. With CPAP, the amount of pressure stays the same while you breathe in and out. With BPAP, the amount of pressure is increased when you breathe in (inhale) so that you can take larger breaths. Your health care provider will recommend whether CPAP or BPAP would be more helpful for you. Why are CPAP and BPAP treatments used? CPAP or BPAP can be helpful if you have:  Sleep apnea.  Chronic obstructive pulmonary disease (COPD).  Heart failure.  Medical conditions that weaken the muscles of the chest including muscular dystrophy, or neurological diseases such as amyotrophic lateral sclerosis (ALS).  Other problems that cause breathing to be weak, abnormal, or difficult. CPAP is most commonly used for obstructive sleep apnea (OSA) to keep the airways from collapsing when the muscles relax during sleep. How is CPAP or BPAP administered? Both CPAP and BPAP are provided by a small machine with a flexible plastic tube that attaches to a plastic mask. You wear the mask. Air is blown through the mask into your nose or mouth. The amount of pressure that is used to blow the air can be adjusted on the machine. Your health care provider will determine the pressure setting that should be used based on your individual needs. When should CPAP or BPAP be used? In most cases, the mask only needs to be worn during sleep. Generally, the mask needs to be  worn throughout the night and during any daytime naps. People with certain medical conditions may also need to wear the mask at other times when they are awake. Follow instructions from your health care provider about when to use the machine. What are some tips for using the mask?   Because the mask needs to be snug, some people feel trapped or closed-in (claustrophobic) when first using the mask. If you feel this way, you may need to get used to the mask. One way to do this is by holding the mask loosely over your nose or mouth and then gradually applying the mask more snugly. You can also gradually increase the amount of time that you use the mask.  Masks are available in various types and sizes. Some fit over your mouth and nose while others fit over just your nose. If your mask does not fit well, talk with your health care provider about getting a different one.  If you are using a mask that fits over your nose and you tend to breathe through your mouth, a chin strap may be applied to help keep your mouth closed.  The CPAP and BPAP machines have alarms that may sound if the mask comes off or develops a leak.  If you have trouble with the mask, it is very important that you talk with your health care provider about finding a way to make the mask easier to tolerate. Do not stop using the mask. Stopping the use of the  mask could have a negative impact on your health. What are some tips for using the machine?  Place your CPAP or BPAP machine on a secure table or stand near an electrical outlet.  Know where the on/off switch is located on the machine.  Follow instructions from your health care provider about how to set the pressure on your machine and when you should use it.  Do not eat or drink while the CPAP or BPAP machine is on. Food or fluids could get pushed into your lungs by the pressure of the CPAP or BPAP.  Do not smoke. Tobacco smoke residue can damage the machine.  For home use,  CPAP and BPAP machines can be rented or purchased through home health care companies. Many different brands of machines are available. Renting a machine before purchasing may help you find out which particular machine works well for you.  Keep the CPAP or BPAP machine and attachments clean. Ask your health care provider for specific instructions. Get help right away if:  You have redness or open areas around your nose or mouth where the mask fits.  You have trouble using the CPAP or BPAP machine.  You cannot tolerate wearing the CPAP or BPAP mask.  You have pain, discomfort, and bloating in your abdomen. Summary  CPAP and BPAP are methods of helping a person breathe with the use of air pressure.  Both CPAP and BPAP are provided by a small machine with a flexible plastic tube that attaches to a plastic mask.  If you have trouble with the mask, it is very important that you talk with your health care provider about finding a way to make the mask easier to tolerate. This information is not intended to replace advice given to you by your health care provider. Make sure you discuss any questions you have with your health care provider. Document Revised: 09/22/2018 Document Reviewed: 04/21/2016 Elsevier Patient Education  2020 [employer].

## 2019-11-30 ENCOUNTER — Ambulatory Visit (INDEPENDENT_AMBULATORY_CARE_PROVIDER_SITE_OTHER): Payer: BC Managed Care – PPO | Admitting: Family Medicine

## 2019-11-30 ENCOUNTER — Other Ambulatory Visit: Payer: Self-pay

## 2019-11-30 ENCOUNTER — Encounter (INDEPENDENT_AMBULATORY_CARE_PROVIDER_SITE_OTHER): Payer: Self-pay | Admitting: Family Medicine

## 2019-11-30 VITALS — BP 109/75 | HR 95 | Temp 98.3°F | Ht 61.0 in | Wt 177.0 lb

## 2019-11-30 DIAGNOSIS — E669 Obesity, unspecified: Secondary | ICD-10-CM | POA: Diagnosis not present

## 2019-11-30 DIAGNOSIS — E7849 Other hyperlipidemia: Secondary | ICD-10-CM

## 2019-11-30 DIAGNOSIS — Z9189 Other specified personal risk factors, not elsewhere classified: Secondary | ICD-10-CM

## 2019-11-30 DIAGNOSIS — E559 Vitamin D deficiency, unspecified: Secondary | ICD-10-CM

## 2019-11-30 DIAGNOSIS — Z6833 Body mass index (BMI) 33.0-33.9, adult: Secondary | ICD-10-CM

## 2019-11-30 MED ORDER — VITAMIN D (ERGOCALCIFEROL) 1.25 MG (50000 UNIT) PO CAPS: 50000.0000 [IU] | ORAL_CAPSULE | ORAL | 0 refills | Status: DC

## 2019-12-02 ENCOUNTER — Encounter (INDEPENDENT_AMBULATORY_CARE_PROVIDER_SITE_OTHER): Payer: Self-pay | Admitting: Family Medicine

## 2019-12-05 NOTE — Progress Notes (Signed)
Chief Complaint:   OBESITY Alexandria Ward is here to discuss her progress with her obesity treatment plan along with follow-up of her obesity related diagnoses. Alexandria Ward is on the Category 1 Plan and states she is following her eating plan approximately 94% of the time. Alexandria Ward states she is doing 0 minutes 0 times per week.  Today's visit was #: 9 Starting weight: 206 lbs Starting date: 07/14/2019 Today's weight: 177 lbs Today's date: 11/30/2019 Total lbs lost to date: 29 Total lbs lost since last in-office visit: 0  Interim History: Alexandria Ward notes increased hunger this past time a bit. She likes the plan overall and has no problems following the plan. She did some celebration eating, cake and dinner rolls, etc and felt awful after. She notes her cravings are well controlled. Otherwise she is taking 1 tablet daily Vit D OTC, but she prefers to take it once weekly.  Subjective:   1. Vitamin D deficiency Alexandria Ward's last Vit D level was 31.7 and is now 77.4 on 09/19/2019.  2. Other hyperlipidemia Alexandria Ward's last LDL was 123 on 09/19/2019. She denies concerns and her ASCVD is <2%.  3. At risk for heart disease Alexandria Ward is at a higher than average risk for cardiovascular disease due to obesity.   Assessment/Plan:   1. Vitamin D deficiency Low Vitamin D level contributes to fatigue and are associated with obesity, breast, and colon cancer. Alexandria Ward agreed to discontinue OTC Vit D 5,000 IU daily and start prescription Vitamin D 50,000 IU every week with no refills. She will follow-up for routine testing of Vitamin D, at least 2-3 times per year to avoid over-replacement.  - Vitamin D, Ergocalciferol, (DRISDOL) 1.25 MG (50000 UNIT) CAPS capsule; Take 1 capsule (50,000 Units total) by mouth every 7 (seven) days.  Dispense: 4 capsule; Refill: 0  2. Other hyperlipidemia Cardiovascular risk and specific lipid/LDL goals reviewed. We discussed several lifestyle modifications today and Alexandria Ward will continue to work on diet,  exercise and weight loss efforts. Orders and follow up as documented in patient record.   3. At risk for heart disease Alexandria Ward was given approximately 30 minutes of coronary artery disease prevention counseling today. She is 54 y.o. female and has risk factors for heart disease including obesity. We discussed intensive lifestyle modifications today with an emphasis on specific weight loss instructions and strategies.   Repetitive spaced learning was employed today to elicit superior memory formation and behavioral change.  4. Class 1 obesity with serious comorbidity and body mass index (BMI) of 33.0 to 33.9 in adult, unspecified obesity type Alexandria Ward is currently in the action stage of change. As such, her goal is to continue with weight loss efforts. She has agreed to the Category 1 Plan.   Exercise goals: As is.  Behavioral modification strategies: increasing lean protein intake and better snacking choices.  Alexandria Ward has agreed to follow-up with our clinic in 2 to 3 weeks. She was informed of the importance of frequent follow-up visits to maximize her success with intensive lifestyle modifications for her multiple health conditions.   Objective:   Blood pressure 109/75, pulse 95, temperature 98.3 F (36.8 C), temperature source Oral, height 5\' 1"  (1.549 m), weight 177 lb (80.3 kg), SpO2 99 %. Body mass index is 33.44 kg/m.  General: Cooperative, alert, well developed, in no acute distress. HEENT: Conjunctivae and lids unremarkable. Cardiovascular: Regular rhythm.  Lungs: Normal work of breathing. Neurologic: No focal deficits.   Lab Results  Component Value Date   CREATININE  0.70 09/19/2019   BUN 19 09/19/2019   NA 142 09/19/2019   K 4.4 09/19/2019   CL 106 09/19/2019   CO2 23 09/19/2019   Lab Results  Component Value Date   ALT 16 09/19/2019   AST 15 09/19/2019   ALKPHOS 101 09/19/2019   BILITOT 0.2 09/19/2019   Lab Results  Component Value Date   HGBA1C 5.6 09/19/2019    HGBA1C 5.8 (H) 07/14/2019   Lab Results  Component Value Date   INSULIN 4.8 09/19/2019   INSULIN 12.3 07/14/2019   Lab Results  Component Value Date   TSH 2.630 09/19/2019   Lab Results  Component Value Date   CHOL 179 09/19/2019   HDL 44 09/19/2019   LDLCALC 123 (H) 09/19/2019   TRIG 65 09/19/2019   Lab Results  Component Value Date   WBC 6.0 09/19/2019   HGB 14.0 09/19/2019   HCT 43.2 09/19/2019   MCV 98 (H) 09/19/2019   PLT 295 09/19/2019   No results found for: IRON, TIBC, FERRITIN  Attestation Statements:   Reviewed by clinician on day of visit: allergies, medications, problem list, medical history, surgical history, family history, social history, and previous encounter notes.   I, Burt Knack, am acting as transcriptionist for Quillian Quince, MD.  I have reviewed the above documentation for accuracy and completeness, and I agree with the above. -  Quillian Quince, MD

## 2019-12-05 NOTE — Telephone Encounter (Signed)
Please advise. Thanks.  

## 2019-12-14 DIAGNOSIS — G4733 Obstructive sleep apnea (adult) (pediatric): Secondary | ICD-10-CM | POA: Diagnosis not present

## 2019-12-21 ENCOUNTER — Encounter (INDEPENDENT_AMBULATORY_CARE_PROVIDER_SITE_OTHER): Payer: Self-pay | Admitting: Family Medicine

## 2019-12-21 ENCOUNTER — Other Ambulatory Visit: Payer: Self-pay

## 2019-12-21 ENCOUNTER — Ambulatory Visit (INDEPENDENT_AMBULATORY_CARE_PROVIDER_SITE_OTHER): Payer: BC Managed Care – PPO | Admitting: Family Medicine

## 2019-12-21 VITALS — BP 96/64 | HR 94 | Temp 98.2°F | Ht 61.0 in | Wt 174.0 lb

## 2019-12-21 DIAGNOSIS — E669 Obesity, unspecified: Secondary | ICD-10-CM | POA: Diagnosis not present

## 2019-12-21 DIAGNOSIS — E039 Hypothyroidism, unspecified: Secondary | ICD-10-CM | POA: Diagnosis not present

## 2019-12-21 DIAGNOSIS — E7800 Pure hypercholesterolemia, unspecified: Secondary | ICD-10-CM | POA: Diagnosis not present

## 2019-12-21 DIAGNOSIS — Z6833 Body mass index (BMI) 33.0-33.9, adult: Secondary | ICD-10-CM | POA: Diagnosis not present

## 2019-12-21 DIAGNOSIS — G4733 Obstructive sleep apnea (adult) (pediatric): Secondary | ICD-10-CM | POA: Diagnosis not present

## 2019-12-21 LAB — TSH: TSH: 3.21 (ref 0.41–5.90)

## 2019-12-21 LAB — LIPID PANEL
Cholesterol: 158 (ref 0–200)
HDL: 44 (ref 35–70)
LDL Cholesterol: 103
LDl/HDL Ratio: 2.4
Triglycerides: 53 (ref 40–160)

## 2019-12-22 NOTE — Progress Notes (Signed)
Chief Complaint:   OBESITY Alexandria Ward is here to discuss her progress with her obesity treatment plan along with follow-up of her obesity related diagnoses. Alexandria Ward is on the Category 1 Plan and states she is following her eating plan approximately 95% of the time. Alexandria Ward states she is doing 0 minutes 0 times per week.  Today's visit was #: 10 Starting weight: 206 lbs Starting date: 07/14/2019 Today's weight: 174 lbs Today's date: 12/21/2019 Total lbs lost to date: 32 Total lbs lost since last in-office visit: 3  Interim History: Alexandria Ward continues to do well with weight loss on her Category 1 plan. Her hunger is controlled, and she is doing well eating all of her protein.  Subjective:   1. Obstructive sleep apnea Alexandria Ward continues to work on using CPAP, and she struggles to keep it on all night.  Assessment/Plan:   1. Obstructive sleep apnea Intensive lifestyle modifications are the first line treatment for this issue.We discussed several lifestyle modifications today. Alexandria Ward will continue to work on diet, exercise and weight loss efforts. CPAP mask strategies were discussed, and Alexandria Ward was encouraged to work with her CPAP and not give up. We will continue to monitor. Orders and follow up as documented in patient record.   2. Class 1 obesity with serious comorbidity and body mass index (BMI) of 33.0 to 33.9 in adult, unspecified obesity type Alexandria Ward is currently in the action stage of change. As such, her goal is to continue with weight loss efforts. She has agreed to the Category 1 Plan.   Exercise goals: Alexandria Ward is to add resistance exercises.  Behavioral modification strategies: increasing lean protein intake.  Alexandria Ward has agreed to follow-up with our clinic in 2 to 3 weeks. She was informed of the importance of frequent follow-up visits to maximize her success with intensive lifestyle modifications for her multiple health conditions.   Objective:   Blood pressure 96/64, pulse 94, temperature  98.2 F (36.8 C), temperature source Oral, height 5\' 1"  (1.549 m), weight 174 lb (78.9 kg), SpO2 100 %. Body mass index is 32.88 kg/m.  General: Cooperative, alert, well developed, in no acute distress. HEENT: Conjunctivae and lids unremarkable. Cardiovascular: Regular rhythm.  Lungs: Normal work of breathing. Neurologic: No focal deficits.   Lab Results  Component Value Date   CREATININE 0.70 09/19/2019   BUN 19 09/19/2019   NA 142 09/19/2019   K 4.4 09/19/2019   CL 106 09/19/2019   CO2 23 09/19/2019   Lab Results  Component Value Date   ALT 16 09/19/2019   AST 15 09/19/2019   ALKPHOS 101 09/19/2019   BILITOT 0.2 09/19/2019   Lab Results  Component Value Date   HGBA1C 5.6 09/19/2019   HGBA1C 5.8 (H) 07/14/2019   Lab Results  Component Value Date   INSULIN 4.8 09/19/2019   INSULIN 12.3 07/14/2019   Lab Results  Component Value Date   TSH 2.630 09/19/2019   Lab Results  Component Value Date   CHOL 179 09/19/2019   HDL 44 09/19/2019   LDLCALC 123 (H) 09/19/2019   TRIG 65 09/19/2019   Lab Results  Component Value Date   WBC 6.0 09/19/2019   HGB 14.0 09/19/2019   HCT 43.2 09/19/2019   MCV 98 (H) 09/19/2019   PLT 295 09/19/2019   No results found for: IRON, TIBC, FERRITIN  Attestation Statements:   Reviewed by clinician on day of visit: allergies, medications, problem list, medical history, surgical history, family history, social history, and  previous encounter notes.  Time spent on visit including pre-visit chart review and post-visit care and charting was 21 minutes.    I, Burt Knack, am acting as transcriptionist for Quillian Quince, MD.  I have reviewed the above documentation for accuracy and completeness, and I agree with the above. -  Quillian Quince, MD

## 2019-12-27 DIAGNOSIS — E039 Hypothyroidism, unspecified: Secondary | ICD-10-CM | POA: Diagnosis not present

## 2019-12-27 DIAGNOSIS — E785 Hyperlipidemia, unspecified: Secondary | ICD-10-CM | POA: Diagnosis not present

## 2019-12-28 ENCOUNTER — Ambulatory Visit (INDEPENDENT_AMBULATORY_CARE_PROVIDER_SITE_OTHER): Payer: BC Managed Care – PPO | Admitting: Family Medicine

## 2020-01-11 ENCOUNTER — Ambulatory Visit (INDEPENDENT_AMBULATORY_CARE_PROVIDER_SITE_OTHER): Payer: BC Managed Care – PPO | Admitting: Family Medicine

## 2020-01-13 DIAGNOSIS — G4733 Obstructive sleep apnea (adult) (pediatric): Secondary | ICD-10-CM | POA: Diagnosis not present

## 2020-01-16 ENCOUNTER — Ambulatory Visit (INDEPENDENT_AMBULATORY_CARE_PROVIDER_SITE_OTHER): Payer: BC Managed Care – PPO | Admitting: Family Medicine

## 2020-01-18 ENCOUNTER — Encounter (INDEPENDENT_AMBULATORY_CARE_PROVIDER_SITE_OTHER): Payer: Self-pay | Admitting: Family Medicine

## 2020-01-18 ENCOUNTER — Ambulatory Visit (INDEPENDENT_AMBULATORY_CARE_PROVIDER_SITE_OTHER): Payer: BC Managed Care – PPO | Admitting: Family Medicine

## 2020-01-18 ENCOUNTER — Other Ambulatory Visit: Payer: Self-pay

## 2020-01-18 VITALS — BP 101/69 | HR 94 | Temp 98.5°F | Ht 61.0 in | Wt 170.0 lb

## 2020-01-18 DIAGNOSIS — Z9189 Other specified personal risk factors, not elsewhere classified: Secondary | ICD-10-CM | POA: Diagnosis not present

## 2020-01-18 DIAGNOSIS — E669 Obesity, unspecified: Secondary | ICD-10-CM | POA: Diagnosis not present

## 2020-01-18 DIAGNOSIS — E559 Vitamin D deficiency, unspecified: Secondary | ICD-10-CM

## 2020-01-18 DIAGNOSIS — Z6832 Body mass index (BMI) 32.0-32.9, adult: Secondary | ICD-10-CM | POA: Diagnosis not present

## 2020-01-18 MED ORDER — VITAMIN D (ERGOCALCIFEROL) 1.25 MG (50000 UNIT) PO CAPS: 50000.0000 [IU] | ORAL_CAPSULE | ORAL | 0 refills | Status: DC

## 2020-01-18 NOTE — Progress Notes (Signed)
Chief Complaint:   OBESITY Alexandria Ward is here to discuss her progress with her obesity treatment plan along with follow-up of her obesity related diagnoses. Alexandria Ward is on the Category 1 Plan and states she is following her eating plan approximately 94% of the time. Alexandria Ward states she is walking steps for 5-10 minutes 2 times per week.  Today's visit was #: 11 Starting weight: 206 lbs Starting date: 07/14/2019 Today's weight: 170 lbs Today's date: 01/18/2020 Total lbs lost to date: 36 Total lbs lost since last in-office visit: 4  Interim History: Kaeli continues to do well with her Category 1 plan. Her hunger is controlled but she has had increased cravings and temptations.  Subjective:   1. Vitamin D deficiency Alexandria Ward is stable on Vit D, and she denies nausea or vomiting. She is due for labs.  2. At risk for osteoporosis Alexandria Ward is at higher risk of osteopenia and osteoporosis due to Vitamin D deficiency.   Assessment/Plan:   1. Vitamin D deficiency Low Vitamin D level contributes to fatigue and are associated with obesity, breast, and colon cancer. We will check labs today, and we will refill prescription Vitamin D for 1 month. Alexandria Ward will follow-up for routine testing of Vitamin D, at least 2-3 times per year to avoid over-replacement.  - Vitamin D, Ergocalciferol, (DRISDOL) 1.25 MG (50000 UNIT) CAPS capsule; Take 1 capsule (50,000 Units total) by mouth every 7 (seven) days.  Dispense: 4 capsule; Refill: 0 - VITAMIN D 25 Hydroxy (Vit-D Deficiency, Fractures)  2. At risk for osteoporosis Alexandria Ward was given approximately 15 minutes of osteoporosis prevention counseling today. Alexandria Ward is at risk for osteopenia and osteoporosis due to her Vitamin D deficiency. She was encouraged to take her Vitamin D and follow her higher calcium diet and increase strengthening exercise to help strengthen her bones and decrease her risk of osteopenia and osteoporosis.  Repetitive spaced learning was employed today  to elicit superior memory formation and behavioral change.  3. Class 1 obesity with serious comorbidity and body mass index (BMI) of 32.0 to 32.9 in adult, unspecified obesity type Alexandria Ward is currently in the action stage of change. As such, her goal is to continue with weight loss efforts. She has agreed to the Category 1 Plan.   Microwave meal handout given today.  Exercise goals: As is.  Behavioral modification strategies: increasing lean protein intake.  Alexandria Ward has agreed to follow-up with our clinic in 3 to 4 weeks. She was informed of the importance of frequent follow-up visits to maximize her success with intensive lifestyle modifications for her multiple health conditions.   Alexandria Ward was informed we would discuss her lab results at her next visit unless there is a critical issue that needs to be addressed sooner. Alexandria Ward agreed to keep her next visit at the agreed upon time to discuss these results.  Objective:   Blood pressure 101/69, pulse 94, temperature 98.5 F (36.9 C), temperature source Oral, height 5\' 1"  (1.549 m), weight 170 lb (77.1 kg), SpO2 100 %. Body mass index is 32.12 kg/m.  General: Cooperative, alert, well developed, in no acute distress. HEENT: Conjunctivae and lids unremarkable. Cardiovascular: Regular rhythm.  Lungs: Normal work of breathing. Neurologic: No focal deficits.   Lab Results  Component Value Date   CREATININE 0.70 09/19/2019   BUN 19 09/19/2019   NA 142 09/19/2019   K 4.4 09/19/2019   CL 106 09/19/2019   CO2 23 09/19/2019   Lab Results  Component Value Date  ALT 16 09/19/2019   AST 15 09/19/2019   ALKPHOS 101 09/19/2019   BILITOT 0.2 09/19/2019   Lab Results  Component Value Date   HGBA1C 5.6 09/19/2019   HGBA1C 5.8 (H) 07/14/2019   Lab Results  Component Value Date   INSULIN 4.8 09/19/2019   INSULIN 12.3 07/14/2019   Lab Results  Component Value Date   TSH 2.630 09/19/2019   Lab Results  Component Value Date   CHOL 179  09/19/2019   HDL 44 09/19/2019   LDLCALC 123 (H) 09/19/2019   TRIG 65 09/19/2019   Lab Results  Component Value Date   WBC 6.0 09/19/2019   HGB 14.0 09/19/2019   HCT 43.2 09/19/2019   MCV 98 (H) 09/19/2019   PLT 295 09/19/2019   No results found for: IRON, TIBC, FERRITIN  Attestation Statements:   Reviewed by clinician on day of visit: allergies, medications, problem list, medical history, surgical history, family history, social history, and previous encounter notes.   I, Burt Knack, am acting as transcriptionist for Nicholas Lose, MD.  I have reviewed the above documentation for accuracy and completeness, and I agree with the above. -  Quillian Quince, MD

## 2020-01-19 ENCOUNTER — Encounter (INDEPENDENT_AMBULATORY_CARE_PROVIDER_SITE_OTHER): Payer: Self-pay

## 2020-01-19 LAB — VITAMIN D 25 HYDROXY (VIT D DEFICIENCY, FRACTURES): Vit D, 25-Hydroxy: 42.4 ng/mL (ref 30.0–100.0)

## 2020-01-25 ENCOUNTER — Encounter (INDEPENDENT_AMBULATORY_CARE_PROVIDER_SITE_OTHER): Payer: Self-pay | Admitting: Family Medicine

## 2020-01-25 NOTE — Telephone Encounter (Signed)
Please advise 

## 2020-02-06 ENCOUNTER — Ambulatory Visit (INDEPENDENT_AMBULATORY_CARE_PROVIDER_SITE_OTHER): Payer: BC Managed Care – PPO | Admitting: Family Medicine

## 2020-02-06 ENCOUNTER — Encounter (INDEPENDENT_AMBULATORY_CARE_PROVIDER_SITE_OTHER): Payer: Self-pay | Admitting: Family Medicine

## 2020-02-08 ENCOUNTER — Ambulatory Visit (INDEPENDENT_AMBULATORY_CARE_PROVIDER_SITE_OTHER): Payer: BC Managed Care – PPO | Admitting: Family Medicine

## 2020-02-08 ENCOUNTER — Other Ambulatory Visit: Payer: Self-pay

## 2020-02-08 ENCOUNTER — Encounter (INDEPENDENT_AMBULATORY_CARE_PROVIDER_SITE_OTHER): Payer: Self-pay | Admitting: Family Medicine

## 2020-02-08 VITALS — BP 90/59 | HR 86 | Temp 98.2°F | Ht 61.0 in | Wt 170.0 lb

## 2020-02-08 DIAGNOSIS — E559 Vitamin D deficiency, unspecified: Secondary | ICD-10-CM

## 2020-02-08 DIAGNOSIS — Z6832 Body mass index (BMI) 32.0-32.9, adult: Secondary | ICD-10-CM | POA: Diagnosis not present

## 2020-02-08 DIAGNOSIS — Z9189 Other specified personal risk factors, not elsewhere classified: Secondary | ICD-10-CM | POA: Diagnosis not present

## 2020-02-08 DIAGNOSIS — E669 Obesity, unspecified: Secondary | ICD-10-CM

## 2020-02-08 MED ORDER — VITAMIN D (ERGOCALCIFEROL) 1.25 MG (50000 UNIT) PO CAPS: 50000.0000 [IU] | ORAL_CAPSULE | ORAL | 0 refills | Status: DC

## 2020-02-08 NOTE — Progress Notes (Signed)
Chief Complaint:   OBESITY Alexandria Ward is here to discuss her progress with her obesity treatment plan along with follow-up of her obesity related diagnoses. Alexandria Ward is on the Category 1 Plan and states she is following her eating plan approximately 94% of the time. Alexandria Ward states she is doing 0 minutes 0 times per week.  Today's visit was #: 12 Starting weight: 206 lbs Starting date: 07/14/2019 Today's weight: 170 lbs Today's date: 02/08/2020 Total lbs lost to date: 36 Total lbs lost since last in-office visit: 0  Interim History: Alexandria Ward has done well with weight loss and maintaining her weight. She notes some increased hunger especially when she eats cereal versus her egg options.  Subjective:   1. Vitamin D deficiency Alexandria Ward's Vit D level was at risk of becoming over replaced, but she missed some doses.  2. At risk for complication associated with hypotension The patient is at a higher than average risk of hypotension due to low blood pressure.  Assessment/Plan:   1. Vitamin D deficiency Low Vitamin D level contributes to fatigue and are associated with obesity, breast, and colon cancer. We will refill prescription Vitamin D for 1 month. Alexandria Ward will follow-up for routine testing of Vitamin D, at least 2-3 times per year to avoid over-replacement.  - Vitamin D, Ergocalciferol, (DRISDOL) 1.25 MG (50000 UNIT) CAPS capsule; Take 1 capsule (50,000 Units total) by mouth every 7 (seven) days.  Dispense: 4 capsule; Refill: 0  2. At risk for complication associated with hypotension Alexandria Ward was given approximately 15 minutes of education and counseling today to help avoid hypotension. We discussed risks of hypotension with weight loss and signs of hypotension such as feeling lightheaded or unsteady.  Repetitive spaced learning was employed today to elicit superior memory formation and behavioral change.  3. Class 1 obesity with serious comorbidity and body mass index (BMI) of 32.0 to 32.9 in  adult, unspecified obesity type Alexandria Ward is currently in the action stage of change. As such, her goal is to continue with weight loss efforts. She has agreed to the Category 1 Plan.   Behavioral modification strategies: increasing lean protein intake and no skipping meals.  Alexandria Ward has agreed to follow-up with our clinic in 3 weeks. She was informed of the importance of frequent follow-up visits to maximize her success with intensive lifestyle modifications for her multiple health conditions.   Objective:   Blood pressure (!) 90/59, pulse 86, temperature 98.2 F (36.8 C), temperature source Oral, height 5\' 1"  (1.549 m), weight 170 lb (77.1 kg), SpO2 99 %. Body mass index is 32.12 kg/m.  General: Cooperative, alert, well developed, in no acute distress. HEENT: Conjunctivae and lids unremarkable. Cardiovascular: Regular rhythm.  Lungs: Normal work of breathing. Neurologic: No focal deficits.   Lab Results  Component Value Date   CREATININE 0.70 09/19/2019   BUN 19 09/19/2019   NA 142 09/19/2019   K 4.4 09/19/2019   CL 106 09/19/2019   CO2 23 09/19/2019   Lab Results  Component Value Date   ALT 16 09/19/2019   AST 15 09/19/2019   ALKPHOS 101 09/19/2019   BILITOT 0.2 09/19/2019   Lab Results  Component Value Date   HGBA1C 5.6 09/19/2019   HGBA1C 5.8 (H) 07/14/2019   Lab Results  Component Value Date   INSULIN 4.8 09/19/2019   INSULIN 12.3 07/14/2019   Lab Results  Component Value Date   TSH 3.21 12/21/2019   Lab Results  Component Value Date   CHOL  158 12/21/2019   HDL 44 12/21/2019   LDLCALC 103 12/21/2019   TRIG 53 12/21/2019   Lab Results  Component Value Date   WBC 6.0 09/19/2019   HGB 14.0 09/19/2019   HCT 43.2 09/19/2019   MCV 98 (H) 09/19/2019   PLT 295 09/19/2019   No results found for: IRON, TIBC, FERRITIN  Attestation Statements:   Reviewed by clinician on day of visit: allergies, medications, problem list, medical history, surgical history,  family history, social history, and previous encounter notes.   I, Burt Knack, am acting as transcriptionist for Quillian Quince, MD.  I have reviewed the above documentation for accuracy and completeness, and I agree with the above. -  Quillian Quince, MD

## 2020-02-13 DIAGNOSIS — G4733 Obstructive sleep apnea (adult) (pediatric): Secondary | ICD-10-CM | POA: Diagnosis not present

## 2020-02-14 ENCOUNTER — Encounter (INDEPENDENT_AMBULATORY_CARE_PROVIDER_SITE_OTHER): Payer: Self-pay | Admitting: Family Medicine

## 2020-02-14 NOTE — Telephone Encounter (Signed)
Please advise 

## 2020-02-15 NOTE — Telephone Encounter (Signed)
Please advise 

## 2020-02-27 ENCOUNTER — Ambulatory Visit (INDEPENDENT_AMBULATORY_CARE_PROVIDER_SITE_OTHER): Payer: BC Managed Care – PPO | Admitting: Family Medicine

## 2020-03-01 DIAGNOSIS — J069 Acute upper respiratory infection, unspecified: Secondary | ICD-10-CM | POA: Diagnosis not present

## 2020-03-01 DIAGNOSIS — Z20822 Contact with and (suspected) exposure to covid-19: Secondary | ICD-10-CM | POA: Diagnosis not present

## 2020-03-05 ENCOUNTER — Ambulatory Visit (INDEPENDENT_AMBULATORY_CARE_PROVIDER_SITE_OTHER): Payer: BC Managed Care – PPO | Admitting: Family Medicine

## 2020-03-15 DIAGNOSIS — G4733 Obstructive sleep apnea (adult) (pediatric): Secondary | ICD-10-CM | POA: Diagnosis not present

## 2020-03-26 ENCOUNTER — Other Ambulatory Visit: Payer: Self-pay

## 2020-03-26 ENCOUNTER — Ambulatory Visit (INDEPENDENT_AMBULATORY_CARE_PROVIDER_SITE_OTHER): Payer: BC Managed Care – PPO | Admitting: Family Medicine

## 2020-03-26 ENCOUNTER — Encounter (INDEPENDENT_AMBULATORY_CARE_PROVIDER_SITE_OTHER): Payer: Self-pay | Admitting: Family Medicine

## 2020-03-26 VITALS — BP 138/70 | HR 92 | Temp 98.4°F | Ht 61.0 in | Wt 168.0 lb

## 2020-03-26 DIAGNOSIS — E559 Vitamin D deficiency, unspecified: Secondary | ICD-10-CM

## 2020-03-26 DIAGNOSIS — E669 Obesity, unspecified: Secondary | ICD-10-CM | POA: Diagnosis not present

## 2020-03-26 DIAGNOSIS — Z9189 Other specified personal risk factors, not elsewhere classified: Secondary | ICD-10-CM | POA: Diagnosis not present

## 2020-03-26 DIAGNOSIS — Z6831 Body mass index (BMI) 31.0-31.9, adult: Secondary | ICD-10-CM | POA: Diagnosis not present

## 2020-03-26 DIAGNOSIS — R030 Elevated blood-pressure reading, without diagnosis of hypertension: Secondary | ICD-10-CM | POA: Diagnosis not present

## 2020-03-26 MED ORDER — VITAMIN D (ERGOCALCIFEROL) 1.25 MG (50000 UNIT) PO CAPS: 50000.0000 [IU] | ORAL_CAPSULE | ORAL | 0 refills | Status: DC

## 2020-03-26 NOTE — Progress Notes (Signed)
Chief Complaint:   OBESITY Alexandria Ward is here to discuss her progress with her obesity treatment plan along with follow-up of her obesity related diagnoses. Alexandria Ward is on the Category 1 Plan and states she is following her eating plan approximately 94-95% of the time. Alexandria Ward states she is doing 0 minutes 0 times per week.  Today's visit was #: 13 Starting weight: 206 lbs Starting date: 07/14/2019 Today's weight: 168 lbs Today's date: 03/26/2020 Total lbs lost to date: 38 Total lbs lost since last in-office visit: 2  Interim History: Alexandria Ward continues to do well with weight loss. She had laryngitis and was on antibiotics and steroids, and then her dogs became sick. She did some stress eating but she was mindful and made better choices.  Subjective:   1. Vitamin D deficiency Alexandria Ward's last Vit D level was not yet at goal. She denies nausea or vomiting. She notes fatigue, bu this is likely due to recovering from recent illness.  2. Pre-hypertension Alexandria Ward's blood pressure is higher than expected today, normal very well controlled. She denies chest pain.  3. At risk for hypertension The patient is at a higher than average risk of hypertension due to elevated blood pressure.  Assessment/Plan:   1. Vitamin D deficiency Low Vitamin D level contributes to fatigue and are associated with obesity, breast, and colon cancer. We will refill prescription Vitamin D for 1 month. Alexandria Ward will follow-up for routine testing of Vitamin D, at least 2-3 times per year to avoid over-replacement.  - Vitamin D, Ergocalciferol, (DRISDOL) 1.25 MG (50000 UNIT) CAPS capsule; Take 1 capsule (50,000 Units total) by mouth every 7 (seven) days.  Dispense: 4 capsule; Refill: 0  2. Pre-hypertension Alexandria Ward is working on healthy weight loss and exercise to improve blood pressure control. We will watch for signs of hypotension as she continues her lifestyle modifications. Alexandria Ward has been ill and on prednisone. Her blood pressure  taken in the other arm was within normal limits. We will recheck her blood pressure in 3 weeks and reassess.  3. At risk for hypertension Alexandria Ward was given approximately 15 minutes of hypertension prevention counseling today. Alexandria Ward is at risk for hypertension due to obesity. We discussed intensive lifestyle modifications today with an emphasis on weight loss as well as increasing exercise and decreasing salt intake.  Repetitive spaced learning was employed today to elicit superior memory formation and behavioral change.  4. Class 1 obesity with serious comorbidity and body mass index (BMI) of 31.0 to 31.9 in adult, unspecified obesity type Alexandria Ward is currently in the action stage of change. As such, her goal is to continue with weight loss efforts. She has agreed to the Category 1 Plan.   Soup recipes were given today.  Exercise goals: All adults should avoid inactivity. Some physical activity is better than none, and adults who participate in any amount of physical activity gain some health benefits.  Behavioral modification strategies: meal planning and cooking strategies and emotional eating strategies.  Alexandria Ward has agreed to follow-up with our clinic in 3 weeks. She was informed of the importance of frequent follow-up visits to maximize her success with intensive lifestyle modifications for her multiple health conditions.   Objective:   Blood pressure 138/70, pulse 92, temperature 98.4 F (36.9 C), height 5\' 1"  (1.549 m), weight 168 lb (76.2 kg), SpO2 97 %. Body mass index is 31.74 kg/m.  General: Cooperative, alert, well developed, in no acute distress. HEENT: Conjunctivae and lids unremarkable. Cardiovascular: Regular  rhythm.  Lungs: Normal work of breathing. Neurologic: No focal deficits.   Lab Results  Component Value Date   CREATININE 0.70 09/19/2019   BUN 19 09/19/2019   NA 142 09/19/2019   K 4.4 09/19/2019   CL 106 09/19/2019   CO2 23 09/19/2019   Lab Results    Component Value Date   ALT 16 09/19/2019   AST 15 09/19/2019   ALKPHOS 101 09/19/2019   BILITOT 0.2 09/19/2019   Lab Results  Component Value Date   HGBA1C 5.6 09/19/2019   HGBA1C 5.8 (H) 07/14/2019   Lab Results  Component Value Date   INSULIN 4.8 09/19/2019   INSULIN 12.3 07/14/2019   Lab Results  Component Value Date   TSH 3.21 12/21/2019   Lab Results  Component Value Date   CHOL 158 12/21/2019   HDL 44 12/21/2019   LDLCALC 103 12/21/2019   TRIG 53 12/21/2019   Lab Results  Component Value Date   WBC 6.0 09/19/2019   HGB 14.0 09/19/2019   HCT 43.2 09/19/2019   MCV 98 (H) 09/19/2019   PLT 295 09/19/2019   No results found for: IRON, TIBC, FERRITIN  Attestation Statements:   Reviewed by clinician on day of visit: allergies, medications, problem list, medical history, surgical history, family history, social history, and previous encounter notes.   I, Burt Knack, am acting as transcriptionist for Quillian Quince, MD.  I have reviewed the above documentation for accuracy and completeness, and I agree with the above. -  Quillian Quince, MD

## 2020-03-30 ENCOUNTER — Telehealth: Payer: Self-pay | Admitting: Family

## 2020-03-30 DIAGNOSIS — U071 COVID-19: Secondary | ICD-10-CM | POA: Diagnosis not present

## 2020-03-30 DIAGNOSIS — R059 Cough, unspecified: Secondary | ICD-10-CM | POA: Diagnosis not present

## 2020-03-30 NOTE — Telephone Encounter (Signed)
Called to Discuss with patient about Covid symptoms and the use of the monoclonal antibody infusion for those with mild to moderate Covid symptoms and at a high risk of hospitalization.     Pt appears to qualify for this infusion due to co-morbid conditions and/or a member of an at-risk group in accordance with the FDA Emergency Use Authorization.    Ms. Labine had symptoms that started on 10/11. Qualifying risk factors include BMI > 25 and obstructive sleep apnea.  Currently has not tested positive for COVID. Currently experiencing fatigue.  Spoke with Ms. Honsinger and currently does not meet the requirements for infusion. She is considering being tested. Resources provided and she will follow up if she tests positive. Hotline provided for call back.   Marcos Eke, NP 03/30/2020 10:59 AM

## 2020-03-31 ENCOUNTER — Other Ambulatory Visit: Payer: Self-pay | Admitting: Family

## 2020-03-31 DIAGNOSIS — U071 COVID-19: Secondary | ICD-10-CM

## 2020-03-31 DIAGNOSIS — G4733 Obstructive sleep apnea (adult) (pediatric): Secondary | ICD-10-CM

## 2020-03-31 DIAGNOSIS — E669 Obesity, unspecified: Secondary | ICD-10-CM

## 2020-03-31 NOTE — Telephone Encounter (Signed)
Ms. Alexandria Ward returned a call today indicating that she had tested positive for COVID through [facility]. Continues to have fatigue.  I discussed the risks, benefits and potential financial costs associated with monoclonal antibody treatment. She wishes to continue with treatment at this time.   Hello Alexandria Ward,   You have been scheduled to receive the monoclonal antibody therapy at [facility]: 04/01/20 at 10:30am   If you have been tested outside of a [facility] - you MUST bring a copy of your positive test with you the morning of your appointment. You may take a photo of this and upload to your MyChart portal or have the testing facility fax the result to [phone]    The address for the infusion clinic site is:  --GPS address is [address] - the parking is located near [employer] building where you will see  COVID19 Infusion feather banner marking the entrance to parking.   (see photos below)            --Enter into the 2nd entrance where the "wave, flag banner" is at the road. Turn into this 2nd entrance and immediately turn left to park in 1 of the 5 parking spots.   --Please stay in your car and call the desk for assistance inside [phone].   --Average time in department is roughly 2 hours for Regeneron treatment - this includes preparation of the medication, IV start and the required 1 hour monitoring after the infusion.    Should you develop worsening shortness of breath, chest pain or severe breathing problems please do not wait for this appointment and go to the Emergency room for evaluation and treatment. You will undergo another oxygen screen before your infusion to ensure this is the best treatment option for you. There is a chance that the best decision may be to send you to the Emergency Room for evaluation at the time of your appointment.   The day of your visit you should: Marland Kitchen Get plenty of rest the night before and drink plenty of  water . Eat a light meal/snack before coming and take your medications as prescribed  . Wear warm, comfortable clothes with a shirt that can roll-up over the elbow (will need IV start).  . Wear a mask  . Consider bringing some activity to help pass the time  Many commercial insurers are waiving bills related to COVID treatment however some have ranged from $300-640. We are starting to see some insurers send bills to patients later for the administration of the medication - we are learning more information but you may receive a bill after your appointment.  Please contact your insurance agent to discuss prior to your appointment if you would like further details about billing specific to your policy.    The CPT code is [phone] for your reference.   Marcos Eke, NP 03/31/2020 2:43 PM

## 2020-03-31 NOTE — Progress Notes (Signed)
I connected by phone with Alexandria Ward on 03/31/2020 at 2:44 PM to discuss the potential use of a new treatment for mild to moderate COVID-19 viral infection in non-hospitalized patients.  This patient is a 54 y.o. female that meets the FDA criteria for Emergency Use Authorization of COVID monoclonal antibody casirivimab/imdevimab or bamlanivimab/eteseviamb.  Has a (+) direct SARS-CoV-2 viral test result  Has mild or moderate COVID-19   Is NOT hospitalized due to COVID-19  Is within 10 days of symptom onset  Has at least one of the high risk factor(s) for progression to severe COVID-19 and/or hospitalization as defined in EUA.  Specific high risk criteria : BMI > 25 and Chronic Lung Disease   I have spoken and communicated the following to the patient or parent/caregiver regarding COVID monoclonal antibody treatment:  1. FDA has authorized the emergency use for the treatment of mild to moderate COVID-19 in adults and pediatric patients with positive results of direct SARS-CoV-2 viral testing who are 2 years of age and older weighing at least 40 kg, and who are at high risk for progressing to severe COVID-19 and/or hospitalization.  2. The significant known and potential risks and benefits of COVID monoclonal antibody, and the extent to which such potential risks and benefits are unknown.  3. Information on available alternative treatments and the risks and benefits of those alternatives, including clinical trials.  4. Patients treated with COVID monoclonal antibody should continue to self-isolate and use infection control measures (e.g., wear mask, isolate, social distance, avoid sharing personal items, clean and disinfect "high touch" surfaces, and frequent handwashing) according to CDC guidelines.   5. The patient or parent/caregiver has the option to accept or refuse COVID monoclonal antibody treatment.  After reviewing this information with the patient, the patient has agreed to  receive one of the available covid 19 monoclonal antibodies and will be provided an appropriate fact sheet prior to infusion.   Jeanine Luz, FNP 03/31/2020 2:44 PM

## 2020-03-31 NOTE — Telephone Encounter (Signed)
Patient reports that she tested positive yesterday and would like to receive the infusion. Forwarding to APP for further assessment.   Heidi Lemay Loyola Mast, RN

## 2020-04-01 ENCOUNTER — Ambulatory Visit (HOSPITAL_COMMUNITY)
Admission: RE | Admit: 2020-04-01 | Discharge: 2020-04-01 | Disposition: A | Discharge status: Home / Self Care | Payer: BC Managed Care – PPO | Source: Ambulatory Visit | Attending: Pulmonary Disease | Admitting: Pulmonary Disease

## 2020-04-01 DIAGNOSIS — E669 Obesity, unspecified: Secondary | ICD-10-CM | POA: Diagnosis not present

## 2020-04-01 DIAGNOSIS — G4733 Obstructive sleep apnea (adult) (pediatric): Secondary | ICD-10-CM | POA: Diagnosis not present

## 2020-04-01 DIAGNOSIS — U071 COVID-19: Secondary | ICD-10-CM | POA: Insufficient documentation

## 2020-04-01 MED ORDER — METHYLPREDNISOLONE SODIUM SUCC 125 MG IJ SOLR: 125.0000 mg | Freq: Once | INTRAMUSCULAR | Status: DC | PRN

## 2020-04-01 MED ORDER — SODIUM CHLORIDE 0.9 % IV SOLN: INTRAVENOUS | Status: DC | PRN

## 2020-04-01 MED ORDER — EPINEPHRINE 0.3 MG/0.3ML IJ SOAJ: 0.3000 mg | Freq: Once | INTRAMUSCULAR | Status: DC | PRN

## 2020-04-01 MED ORDER — DIPHENHYDRAMINE HCL 50 MG/ML IJ SOLN: 50.0000 mg | Freq: Once | INTRAMUSCULAR | Status: DC | PRN

## 2020-04-01 MED ORDER — FAMOTIDINE IN NACL 20-0.9 MG/50ML-% IV SOLN: 20.0000 mg | Freq: Once | INTRAVENOUS | Status: DC | PRN

## 2020-04-01 MED ORDER — ALBUTEROL SULFATE HFA 108 (90 BASE) MCG/ACT IN AERS: 2.0000 | INHALATION_SPRAY | Freq: Once | RESPIRATORY_TRACT | Status: DC | PRN

## 2020-04-01 MED ORDER — SODIUM CHLORIDE 0.9 % IV SOLN: Freq: Once | INTRAVENOUS | Status: AC

## 2020-04-01 NOTE — Discharge Instructions (Signed)

## 2020-04-01 NOTE — Progress Notes (Signed)
Patient ID: Alexandria Ward, female   DOB: 21-Jun-1965, 54 y.o.   MRN: [id number]   Diagnosis: COVID-19  Physician:  Procedure: Covid Infusion Clinic Med: bamlanivimab\etesevimab infusion - Provided patient with bamlanimivab\etesevimab fact sheet for patients, parents and caregivers prior to infusion.  Complications: No immediate complications noted.  Discharge: Discharged home   Kateri Plummer 04/01/2020

## 2020-04-14 DIAGNOSIS — G4733 Obstructive sleep apnea (adult) (pediatric): Secondary | ICD-10-CM | POA: Diagnosis not present

## 2020-04-16 ENCOUNTER — Ambulatory Visit (INDEPENDENT_AMBULATORY_CARE_PROVIDER_SITE_OTHER): Payer: BC Managed Care – PPO | Admitting: Family Medicine

## 2020-04-24 LAB — BASIC METABOLIC PANEL
BUN: 14 (ref 4–21)
CO2: 28 — AB (ref 13–22)
Chloride: 110 — AB (ref 99–108)
Creatinine: 0.9 (ref <=1.1)
Glucose: 85
Potassium: 4.3 (ref 3.4–5.3)
Sodium: 147 (ref 137–147)

## 2020-04-24 LAB — TSH: TSH: 2.41 (ref <=5.90)

## 2020-04-24 LAB — LIPID PANEL
Cholesterol: 173 (ref 0–200)
HDL: 55 (ref 35–70)
LDL Cholesterol: 111
LDl/HDL Ratio: 2
Triglycerides: 33 — AB (ref 40–160)

## 2020-04-24 LAB — HEPATIC FUNCTION PANEL
ALT: 26 (ref 7–35)
AST: 14 (ref 13–35)
Alkaline Phosphatase: 78 (ref 25–125)
Bilirubin, Total: 0.3

## 2020-04-24 LAB — CBC AND DIFFERENTIAL
HCT: 42 (ref 36–46)
Hemoglobin: 13.6 (ref 12.0–16.0)
Platelets: 297 (ref 150–399)
WBC: 6.1

## 2020-04-24 LAB — COMPREHENSIVE METABOLIC PANEL
Albumin: 3.3 — AB (ref 3.5–5.0)
Calcium: 9.1 (ref 8.7–10.7)
GFR calc Af Amer: 83.1
GFR calc non Af Amer: 68.67
Globulin: 2.8

## 2020-04-24 LAB — HEMOGLOBIN A1C: Hemoglobin A1C: 5.5

## 2020-04-24 LAB — CBC: RBC: 4.39 (ref 3.87–5.11)

## 2020-04-26 DIAGNOSIS — E039 Hypothyroidism, unspecified: Secondary | ICD-10-CM | POA: Diagnosis not present

## 2020-04-26 DIAGNOSIS — G43909 Migraine, unspecified, not intractable, without status migrainosus: Secondary | ICD-10-CM | POA: Diagnosis not present

## 2020-05-07 ENCOUNTER — Encounter (INDEPENDENT_AMBULATORY_CARE_PROVIDER_SITE_OTHER): Payer: Self-pay

## 2020-05-07 ENCOUNTER — Other Ambulatory Visit: Payer: Self-pay

## 2020-05-07 ENCOUNTER — Ambulatory Visit (INDEPENDENT_AMBULATORY_CARE_PROVIDER_SITE_OTHER): Payer: BC Managed Care – PPO | Admitting: Family Medicine

## 2020-05-07 ENCOUNTER — Encounter (INDEPENDENT_AMBULATORY_CARE_PROVIDER_SITE_OTHER): Payer: Self-pay | Admitting: Family Medicine

## 2020-05-07 VITALS — BP 100/66 | HR 72 | Temp 98.1°F | Ht 61.0 in | Wt 172.0 lb

## 2020-05-07 DIAGNOSIS — Z9189 Other specified personal risk factors, not elsewhere classified: Secondary | ICD-10-CM | POA: Diagnosis not present

## 2020-05-07 DIAGNOSIS — E669 Obesity, unspecified: Secondary | ICD-10-CM | POA: Diagnosis not present

## 2020-05-07 DIAGNOSIS — Z6832 Body mass index (BMI) 32.0-32.9, adult: Secondary | ICD-10-CM | POA: Diagnosis not present

## 2020-05-07 DIAGNOSIS — E559 Vitamin D deficiency, unspecified: Secondary | ICD-10-CM

## 2020-05-07 MED ORDER — VITAMIN D (ERGOCALCIFEROL) 1.25 MG (50000 UNIT) PO CAPS: 50000.0000 [IU] | ORAL_CAPSULE | ORAL | 0 refills | Status: DC

## 2020-05-07 NOTE — Progress Notes (Signed)
Chief Complaint:   OBESITY Alexandria Ward is here to discuss her progress with her obesity treatment plan along with follow-up of her obesity related diagnoses. Alexandria Ward is on the Category 1 Plan and states she is following her eating plan approximately 75% of the time. Alexandria Ward states she is doing 0 minutes 0 times per week.  Today's visit was #: 14 Starting weight: 206 lbs Starting date: 07/14/2019 Today's weight: 172 lbs Today's date: 05/07/2020 Total lbs lost to date: 34 Total lbs lost since last in-office visit: 0  Interim History: Alexandria Ward hasn't felt well and she hasn't been able to follow her plan closely. She notes decreased protein and increased simple carbohydrates. She is dealing with a lot of stress and she hasn't been able to exercise as much as she would like.  Subjective:   1. Vitamin D deficiency Alexandria Ward is stable on Vit D, and her labs that were done at her primary care physician's office didn't include Vit D. Her last Vit D level was not yet at goal.  2. At risk for impaired metabolic function Alexandria Ward is at increased risk for impaired metabolic function due to decrease in exercise.  Assessment/Plan:   1. Vitamin D deficiency Low Vitamin D level contributes to fatigue and are associated with obesity, breast, and colon cancer. We will refill prescription Vitamin D for 1 month. We will plan to recheck labs soon. Alexandria Ward will follow-up for routine testing of Vitamin D, at least 2-3 times per year to avoid over-replacement.  - Vitamin D, Ergocalciferol, (DRISDOL) 1.25 MG (50000 UNIT) CAPS capsule; Take 1 capsule (50,000 Units total) by mouth every 7 (seven) days.  Dispense: 4 capsule; Refill: 0  2. At risk for impaired metabolic function Alexandria Ward was given approximately 15 minutes of impaired  metabolic function prevention counseling today. We discussed intensive lifestyle modifications today with an emphasis on specific nutrition and exercise instructions and strategies.   Repetitive  spaced learning was employed today to elicit superior memory formation and behavioral change.  3. Class 1 obesity with serious comorbidity and body mass index (BMI) of 32.0 to 32.9 in adult, unspecified obesity type Alexandria Ward is currently in the action stage of change. As such, her goal is to continue with weight loss efforts. She has agreed to the Category 1 Plan.   Behavioral modification strategies: increasing lean protein intake and celebration eating strategies.  Alexandria Ward has agreed to follow-up with our clinic in 3 weeks. She was informed of the importance of frequent follow-up visits to maximize her success with intensive lifestyle modifications for her multiple health conditions.   Objective:   Blood pressure 100/66, pulse 72, temperature 98.1 F (36.7 C), height 5\' 1"  (1.549 m), weight 172 lb (78 kg), SpO2 99 %. Body mass index is 32.5 kg/m.  General: Cooperative, alert, well developed, in no acute distress. HEENT: Conjunctivae and lids unremarkable. Cardiovascular: Regular rhythm.  Lungs: Normal work of breathing. Neurologic: No focal deficits.   Lab Results  Component Value Date   CREATININE 0.9 04/24/2020   BUN 14 04/24/2020   NA 147 04/24/2020   K 4.3 04/24/2020   CL 110 (A) 04/24/2020   CO2 28 (A) 04/24/2020   Lab Results  Component Value Date   ALT 26 04/24/2020   AST 14 04/24/2020   ALKPHOS 78 04/24/2020   BILITOT 0.2 09/19/2019   Lab Results  Component Value Date   HGBA1C 5.5 04/24/2020   HGBA1C 5.6 09/19/2019   HGBA1C 5.8 (H) 07/14/2019   Lab  Results  Component Value Date   INSULIN 4.8 09/19/2019   INSULIN 12.3 07/14/2019   Lab Results  Component Value Date   TSH 2.41 04/24/2020   Lab Results  Component Value Date   CHOL 173 04/24/2020   HDL 55 04/24/2020   LDLCALC 111 04/24/2020   TRIG 33 (A) 04/24/2020   Lab Results  Component Value Date   WBC 6.1 04/24/2020   HGB 13.6 04/24/2020   HCT 42 04/24/2020   MCV 98 (H) 09/19/2019   PLT 297  04/24/2020   No results found for: IRON, TIBC, FERRITIN  Attestation Statements:   Reviewed by clinician on day of visit: allergies, medications, problem list, medical history, surgical history, family history, social history, and previous encounter notes.   I, Burt Knack, am acting as transcriptionist for Quillian Quince, MD.  I have reviewed the above documentation for accuracy and completeness, and I agree with the above. -  Quillian Quince, MD

## 2020-05-15 DIAGNOSIS — G4733 Obstructive sleep apnea (adult) (pediatric): Secondary | ICD-10-CM | POA: Diagnosis not present

## 2020-05-29 ENCOUNTER — Ambulatory Visit: Payer: BC Managed Care – PPO | Admitting: Family Medicine

## 2020-05-29 ENCOUNTER — Encounter: Payer: Self-pay | Admitting: Family Medicine

## 2020-05-29 ENCOUNTER — Ambulatory Visit: Payer: BC Managed Care – PPO | Admitting: Adult Health

## 2020-05-29 VITALS — BP 117/68 | HR 116 | Ht 60.0 in | Wt 181.0 lb

## 2020-05-29 DIAGNOSIS — G4700 Insomnia, unspecified: Secondary | ICD-10-CM

## 2020-05-29 DIAGNOSIS — Z789 Other specified health status: Secondary | ICD-10-CM

## 2020-05-29 DIAGNOSIS — G4733 Obstructive sleep apnea (adult) (pediatric): Secondary | ICD-10-CM

## 2020-05-29 NOTE — Patient Instructions (Signed)
Please consider continuing to use your CPAP regularly. Other options would be to continue weight loss efforts and dental referral .   While your insurance requires that you use CPAP at least 4 hours each night on 70% of the nights, I recommend, that you not skip any nights and use it throughout the night if you can. Getting used to CPAP and staying with the treatment long term does take time and patience and discipline. Untreated obstructive sleep apnea when it is moderate to severe can have an adverse impact on cardiovascular health and raise her risk for heart disease, arrhythmias, hypertension, congestive heart failure, stroke and diabetes. Untreated obstructive sleep apnea causes sleep disruption, nonrestorative sleep, and sleep deprivation. This can have an impact on your day to day functioning and cause daytime sleepiness and impairment of cognitive function, memory loss, mood disturbance, and problems focussing. Using CPAP regularly can improve these symptoms.  Follow up in 6 months if you continue Ambien or restart CPAP. If not, may return to PCP.   Sleep Apnea Sleep apnea affects breathing during sleep. It causes breathing to stop for a short time or to become shallow. It can also increase the risk of:  Heart attack.  Stroke.  Being very overweight (obese).  Diabetes.  Heart failure.  Irregular heartbeat. The goal of treatment is to help you breathe normally again. What are the causes? There are three kinds of sleep apnea:  Obstructive sleep apnea. This is caused by a blocked or collapsed airway.  Central sleep apnea. This happens when the brain does not send the right signals to the muscles that control breathing.  Mixed sleep apnea. This is a combination of obstructive and central sleep apnea. The most common cause of this condition is a collapsed or blocked airway. This can happen if:  Your throat muscles are too relaxed.  Your tongue and tonsils are too large.  You  are overweight.  Your airway is too small. What increases the risk?  Being overweight.  Smoking.  Having a small airway.  Being older.  Being female.  Drinking alcohol.  Taking medicines to calm yourself (sedatives or tranquilizers).  Having family members with the condition. What are the signs or symptoms?  Trouble staying asleep.  Being sleepy or tired during the day.  Getting angry a lot.  Loud snoring.  Headaches in the morning.  Not being able to focus your mind (concentrate).  Forgetting things.  Less interest in sex.  Mood swings.  Personality changes.  Feelings of sadness (depression).  Waking up a lot during the night to pee (urinate).  Dry mouth.  Sore throat. How is this diagnosed?  Your medical history.  A physical exam.  A test that is done when you are sleeping (sleep study). The test is most often done in a sleep lab but may also be done at home. How is this treated?   Sleeping on your side.  Using a medicine to get rid of mucus in your nose (decongestant).  Avoiding the use of alcohol, medicines to help you relax, or certain pain medicines (narcotics).  Losing weight, if needed.  Changing your diet.  Not smoking.  Using a machine to open your airway while you sleep, such as: ? An oral appliance. This is a mouthpiece that shifts your lower jaw forward. ? A CPAP device. This device blows air through a mask when you breathe out (exhale). ? An EPAP device. This has valves that you put in each nostril. ?  A BPAP device. This device blows air through a mask when you breathe in (inhale) and breathe out.  Having surgery if other treatments do not work. It is important to get treatment for sleep apnea. Without treatment, it can lead to:  High blood pressure.  Coronary artery disease.  In men, not being able to have an erection (impotence).  Reduced thinking ability. Follow these instructions at home: Lifestyle  Make changes  that your doctor recommends.  Eat a healthy diet.  Lose weight if needed.  Avoid alcohol, medicines to help you relax, and some pain medicines.  Do not use any products that contain nicotine or tobacco, such as cigarettes, e-cigarettes, and chewing tobacco. If you need help quitting, ask your doctor. General instructions  Take over-the-counter and prescription medicines only as told by your doctor.  If you were given a machine to use while you sleep, use it only as told by your doctor.  If you are having surgery, make sure to tell your doctor you have sleep apnea. You may need to bring your device with you.  Keep all follow-up visits as told by your doctor. This is important. Contact a doctor if:  The machine that you were given to use during sleep bothers you or does not seem to be working.  You do not get better.  You get worse. Get help right away if:  Your chest hurts.  You have trouble breathing in enough air.  You have an uncomfortable feeling in your back, arms, or stomach.  You have trouble talking.  One side of your body feels weak.  A part of your face is hanging down. These symptoms may be an emergency. Do not wait to see if the symptoms will go away. Get medical help right away. Call your local emergency services (911 in the [place] Do not drive yourself to the hospital. Summary  This condition affects breathing during sleep.  The most common cause is a collapsed or blocked airway.  The goal of treatment is to help you breathe normally while you sleep. This information is not intended to replace advice given to you by your health care provider. Make sure you discuss any questions you have with your health care provider. Document Revised: 03/19/2018 Document Reviewed: 01/26/2018 Elsevier Patient Education  2020 [employer].

## 2020-05-29 NOTE — Progress Notes (Signed)
  PATIENT: Alexandria Ward DOB: 08/17/1965  REASON FOR VISIT: follow up HISTORY FROM: patient  Chief Complaint  Patient presents with  . Follow-up     HISTORY OF PRESENT ILLNESS: Today 05/29/20 Alexandria Ward is a 54 y.o. female here today for follow up for OSA. HST in 07/2019 showed AHI 9/hr with REM AHI 32/hr. She was started on CPAP therapy but was not able to tolerate wearing a mask. She was referred to healthy weight and wellness and has lost 36 pounds since sleep study. She continues to focus on healthy lifestyle habits. She has taken Ambien a few times and it seems to help. She feels that she is nervous to take this regularly as it makes her sleep so deeply.     HISTORY: (copied from previous note)  Alexandria Wardis a 53 year- old Caucasian female patientseen in a RV on 11/28/2019.  I had the pleasure of seeing her in consultation 1/ 12 /2021 and underwent 2 sleep studies. When I first met [name] in January she had a reported history was in 6 months prior to our appointment of vertigo and dizziness she had been seen by vestibular rehab and PT.  She developed ankle edema and swelling and she had reported quite a significant steady weight gain.  We discussed referral to healthy weight and wellness service of .  She enrolled and in the meantime has lost 29 pounds! Her insurance company did not permit us to do an attended sleep study also she had a history of parasomnia and vivid dreams.  She underwent a home sleep test on 10 August 2019. Mild obstructive sleep apnea was documented with an overall  Apnea- Hypopnea Index/ hour of sleep (AHI) of 9.2/h and strong  REM sleep dependency. The REM sleep AHI was 32.3/h, three times  the overall AHI. There was also supine sleep position dependency.  Herat rate variability was within normal range and no clinically  significant hypoxemia was noted. Moderately loud snoring was  indicated by RDI.    Unfortunately hospital  refused data to reviewed only 6 nights but in the last 3 months.  The user time for those exceptional nights were 3 hours and 41 minutes on average the AutoSet is set between a minimum pressure of 5 and a maximum pressure of 15 was 3 cm EPR and the residual AHI is 1.7 given that she had a mild apnea to begin with this is still a significant reduction in apnea counts.  No central apneas are noted and the air leak is moderate the 95th percentile pressure is only 7.9 cmH2O.  Also she wore a retainer and bite guard. She woke with migraines.   It may help if we are reducing the upper pressure window for the patient. She feels that it was too much pressure.      I have the pleasure of seeing Alexandria Ward today,a right -handed Caucasian female with a possible sleep disorder. She  has a past medical history of Anemia, Asthma, Back pain, Constipation, Depression, Dyspnea, Fatigue, Fluid retention, Hyperlipidemia, Hypothyroidism, IBS (irritable bowel syndrome), Joint pain, Lower extremity edema, Migraines, Palpitations, Shortness of breath, Tachycardia, Thyroid disease, Vitamin B 12 deficiency, and Vitamin D deficiency.. She is taking synthroid. But developed tachycardia, ankle edema.and hypotension with Vertigo on BP medication.  She gained 50 pounds , is menopausal. Was diagnosed with hypothyroidism 12 month ago.  She has seen Dr. Kerr , who did a (negative) cortisol test.     Sleeprelevant medical history: Familymedical /sleep history: maternal Aunt had a CPAP machine, passed away.  Social history:Patient is working as a security analyst - IT- and lives in a household alone. Family status is single , without children. Her mother lives across the street.  The patient currently works from home. Pets are present. 2 poodles. Tobacco use never . ETOH use ; rare , Caffeine intake in form of Coffee( 1 mug in AM ) Soda( none ) Tea ( at lunch ) or energy drinks. Regular exercise- interrupted by Covid.     Sleep habits are as follows:Lunch is with her mom, generally the biggest meal. The patient's dinner time is between 8-8.30 PM. She eats a light dinner. She often falls asleep at her moms watching TV.  Returns to her house- has GERD- takes amitriptyline. She wears a mouth guard. She sis a mouth breather.  The patient goes to bed at 12- PM to 2 AM. She continues to sleep for 3 hours, wakes for 2 bathroom breaks, the first time at 4 AM.   The preferred sleep position is right lateral- , with the support of 2 pillows, the bed is flat- she prefers a recliner.  pillows. Dreams are reportedly infrequent.  7 AM is the usual rise time. The patient wakes up with several  Alarms  At 6.30-- 6.45 AM .   She reports not feeling refreshed or restored in AM, with symptoms such as dry mouth, morning headaches,  and residual fatigue. Naps are taken  lasting several hours and are more refreshing than nocturnal sleep.   REVIEW OF SYSTEMS: Out of a complete 14 system review of symptoms, the patient complains only of the following symptoms, fatigue and all other reviewed systems are negative.  ALLERGIES: Allergies  Allergen Reactions  . Betadine [Povidone Iodine]   . Azithromycin Rash  . Ciprofloxacin Rash  . Penicillins Rash    Cant remember the reaction or the severity of it  . Tape Rash    HOME MEDICATIONS: Outpatient Medications Prior to Visit  Medication Sig Dispense Refill  . amitriptyline (ELAVIL) 50 MG tablet Take 50 mg by mouth at bedtime.   4  . Carboxymethylcellulose Sod PF 1 % GEL Place 1 application into both eyes as needed.    . Emollient (COLLAGEN EX) Apply topically.    . fexofenadine (ALLEGRA) 180 MG tablet Take by mouth.    . fluconazole (DIFLUCAN) 150 MG tablet Take 1 tablet by mouth as needed.    . fluticasone (FLONASE) 50 MCG/ACT nasal spray Place into the nose.    . Magnesium 400 MG CAPS Take 400 mg by mouth daily.    . thyroid (ARMOUR) 15 MG tablet Take 15 mg by mouth daily.    .  Vitamin D, Ergocalciferol, (DRISDOL) 1.25 MG (50000 UNIT) CAPS capsule Take 1 capsule (50,000 Units total) by mouth every 7 (seven) days. 4 capsule 0  . zolmitriptan (ZOMIG) 5 MG tablet Take 5 mg by mouth as needed for migraine.    . zolpidem (AMBIEN CR) 6.25 MG CR tablet Take 1 tablet (6.25 mg total) by mouth at bedtime as needed for sleep (only for as needed use.). 30 tablet 0   No facility-administered medications prior to visit.    PAST MEDICAL HISTORY: Past Medical History:  Diagnosis Date  . Anemia   . Asthma   . Back pain   . Constipation   . Depression   . Dyspnea   . Fatigue   . Fluid retention   .   Hyperlipidemia   . Hypothyroidism   . IBS (irritable bowel syndrome)   . Joint pain   . Lower extremity edema   . Migraines   . Palpitations   . Shortness of breath   . Tachycardia   . Thyroid disease   . Vitamin B 12 deficiency   . Vitamin D deficiency     PAST SURGICAL HISTORY: Past Surgical History:  Procedure Laterality Date  . APPENDECTOMY    . endometrial ablasion      FAMILY HISTORY: Family History  Problem Relation Age of Onset  . Restless legs syndrome Mother   . Arthritis Mother   . Neuropathy Mother   . Hyperlipidemia Mother   . Depression Mother   . Heart attack Father     SOCIAL HISTORY: Social History   Socioeconomic History  . Marital status: Single    Spouse name: Not on file  . Number of children: 0  . Years of education: Not on file  . Highest education level: Not on file  Occupational History  . Occupation: Market researcher  Tobacco Use  . Smoking status: Never Smoker  . Smokeless tobacco: Never Used  Vaping Use  . Vaping Use: Never used  Substance and Sexual Activity  . Alcohol use: Yes    Comment: rarely  . Drug use: Never  . Sexual activity: Not on file  Other Topics Concern  . Not on file  Social History Narrative  . Not on file   Social Determinants of Health   Financial Resource Strain: Not on file   Food Insecurity: Not on file  Transportation Needs: Not on file  Physical Activity: Not on file  Stress: Not on file  Social Connections: Not on file  Intimate Partner Violence: Not on file     PHYSICAL EXAM  Vitals:   05/29/20 1536  BP: 117/68  Pulse: (!) 116  Weight: 181 lb (82.1 kg)  Height: 5' (1.524 m)   Body mass index is 35.35 kg/m.  Generalized: Well developed, in no acute distress  Cardiology: normal rate and rhythm, no murmur noted Respiratory: clear to auscultation bilaterally  Neurological examination  Mentation: Alert oriented to time, place, history taking. Follows all commands speech and language fluent Cranial nerve II-XII: Pupils were equal round reactive to light. Extraocular movements were full, visual field were full  Motor: The motor testing reveals 5 over 5 strength of all 4 extremities. Good symmetric motor tone is noted throughout.  Gait and station: Gait is normal.    DIAGNOSTIC DATA (LABS, IMAGING, TESTING) - I reviewed patient records, labs, notes, testing and imaging myself where available.  No flowsheet data found.   Lab Results  Component Value Date   WBC 6.1 04/24/2020   HGB 13.6 04/24/2020   HCT 42 04/24/2020   MCV 98 (H) 09/19/2019   PLT 297 04/24/2020      Component Value Date/Time   NA 147 04/24/2020 0000   K 4.3 04/24/2020 0000   CL 110 (A) 04/24/2020 0000   CO2 28 (A) 04/24/2020 0000   GLUCOSE 76 09/19/2019 1046   BUN 14 04/24/2020 0000   CREATININE 0.9 04/24/2020 0000   CREATININE 0.70 09/19/2019 1046   CALCIUM 9.1 04/24/2020 0000   PROT 6.5 09/19/2019 1046   ALBUMIN 3.3 (A) 04/24/2020 0000   ALBUMIN 4.1 09/19/2019 1046   AST 14 04/24/2020 0000   ALT 26 04/24/2020 0000   ALKPHOS 78 04/24/2020 0000   BILITOT 0.2 09/19/2019 1046   GFRNONAA  68.67 04/24/2020 0000   GFRAA 83.10 04/24/2020 0000   Lab Results  Component Value Date   CHOL 173 04/24/2020   HDL 55 04/24/2020   LDLCALC 111 04/24/2020   TRIG 33 (A)  04/24/2020   Lab Results  Component Value Date   HGBA1C 5.5 04/24/2020   Lab Results  Component Value Date   VITAMINB12 352 07/14/2019   Lab Results  Component Value Date   TSH 2.41 04/24/2020     ASSESSMENT AND PLAN 54 y.o. year old female  has a past medical history of Anemia, Asthma, Back pain, Constipation, Depression, Dyspnea, Fatigue, Fluid retention, Hyperlipidemia, Hypothyroidism, IBS (irritable bowel syndrome), Joint pain, Lower extremity edema, Migraines, Palpitations, Shortness of breath, Tachycardia, Thyroid disease, Vitamin B 12 deficiency, and Vitamin D deficiency. here with     ICD-10-CM   1. OSA (obstructive sleep apnea)  G47.33   2. Intolerance of continuous positive airway pressure (CPAP) ventilation  Z78.9   3. Insomnia, unspecified type  G47.00      Karishma M Latchford had a difficult time tolerating CPAP therapy and has discontinued usage.  We do not have compliance data to review today.  She is uncertain how she wishes to proceed.  We have discussed sleep study results and risk of untreated sleep apnea.  She was reminded that CPAP therapy is the gold standard in management of sleep apnea.  She has lost 36 pounds since sleep study.  We have discussed potentially repeating HST.  She is not interested at this time.  We have also discussed consideration of a dental appliance with dentistry.  She does not feel that she wishes to pursue this option at this time.  She does wish to continue focusing on healthy lifestyle changes and will continue to follow-up closely with healthy weight and wellness.  She will continue Ambien as needed.  She will follow-up with me in 6 months should she continue use of Ambien, otherwise, she may return to PCP.  She verbalizes understanding and agreement with plan.  No orders of the defined types were placed in this encounter.    No orders of the defined types were placed in this encounter.     I spent 15 minutes with the patient. 50% of this  time was spent counseling and educating patient on plan of care and medications.    Amy Lomax, FNP-C 05/29/2020, 4:15 PM [facility] [address], [address] [place], [place] [address] [phone]  

## 2020-05-30 ENCOUNTER — Encounter (INDEPENDENT_AMBULATORY_CARE_PROVIDER_SITE_OTHER): Payer: Self-pay | Admitting: Family Medicine

## 2020-05-30 ENCOUNTER — Other Ambulatory Visit: Payer: Self-pay

## 2020-05-30 ENCOUNTER — Ambulatory Visit (INDEPENDENT_AMBULATORY_CARE_PROVIDER_SITE_OTHER): Payer: BC Managed Care – PPO | Admitting: Family Medicine

## 2020-05-30 VITALS — BP 107/69 | HR 93 | Temp 97.7°F | Ht 61.0 in | Wt 174.0 lb

## 2020-05-30 DIAGNOSIS — E559 Vitamin D deficiency, unspecified: Secondary | ICD-10-CM | POA: Diagnosis not present

## 2020-05-30 DIAGNOSIS — Z6832 Body mass index (BMI) 32.0-32.9, adult: Secondary | ICD-10-CM | POA: Diagnosis not present

## 2020-05-30 DIAGNOSIS — Z9189 Other specified personal risk factors, not elsewhere classified: Secondary | ICD-10-CM

## 2020-05-30 DIAGNOSIS — G4709 Other insomnia: Secondary | ICD-10-CM

## 2020-05-30 DIAGNOSIS — E669 Obesity, unspecified: Secondary | ICD-10-CM

## 2020-05-30 MED ORDER — VITAMIN D (ERGOCALCIFEROL) 1.25 MG (50000 UNIT) PO CAPS: 50000.0000 [IU] | ORAL_CAPSULE | ORAL | 0 refills | Status: DC

## 2020-05-30 NOTE — Progress Notes (Signed)
Chief Complaint:   OBESITY Alexandria Ward is here to discuss her progress with her obesity treatment plan along with follow-up of her obesity related diagnoses. Alexandria Ward is on the Category 1 Plan and states she is following her eating plan approximately 50% of the time. Alexandria Ward states she is doing 0 minutes 0 times per week.  Today's visit was #: 15 Starting weight: 206 lbs Starting date: 07/14/2019 Today's weight: 174 lbs Today's date: 05/30/2020 Total lbs lost to date: 32 Total lbs lost since last in-office visit: 0  Interim History: Alexandria Ward had more celebration eating since Thanksgiving and with her birthday. She has had more temptations, but she feels she will be able to get back on track after the holidays.  Subjective:   1. Other insomnia Alexandria Ward is unable to get used to the CPAP. She sleeps approximately 5-6 hours per night. She is followed by her sleep doctors.  2. Vitamin D deficiency Alexandria Ward is stable on Vit D, and she denies nausea, vomiting, or muscle weakness.  3. At risk for impaired metabolic function Alexandria Ward is at increased risk for impaired metabolic function if her protein decreases.  Assessment/Plan:   1. Other insomnia The problem of recurrent insomnia was discussed. Orders and follow up as documented in patient record. Counseling: Intensive lifestyle modifications are the first line treatment for this issue. We discussed several lifestyle modifications today. Alexandria Ward will continue to work on increasing her sleep to 7-8 hours to maximize weight loss. She will continue to work on diet, exercise and weight loss efforts, and will follow up as directed.   2. Vitamin D deficiency Low Vitamin D level contributes to fatigue and are associated with obesity, breast, and colon cancer. We will refill prescription Vitamin D for 1 month. Alexandria Ward will follow-up for routine testing of Vitamin D, at least 2-3 times per year to avoid over-replacement.  - Vitamin D, Ergocalciferol, (DRISDOL) 1.25 MG  (50000 UNIT) CAPS capsule; Take 1 capsule (50,000 Units total) by mouth every 7 (seven) days.  Dispense: 4 capsule; Refill: 0  3. At risk for impaired metabolic function Alexandria Ward was given approximately 15 minutes of impaired  metabolic function prevention counseling today. We discussed intensive lifestyle modifications today with an emphasis on specific nutrition and exercise instructions and strategies.   Repetitive spaced learning was employed today to elicit superior memory formation and behavioral change.  4. Class 1 obesity with serious comorbidity and body mass index (BMI) of 32.0 to 32.9 in adult, unspecified obesity type Alexandria Ward is currently in the action stage of change. As such, her goal is to continue with weight loss efforts. She has agreed to the Category 1 Plan.   Behavioral modification strategies: increasing lean protein intake, meal planning and cooking strategies and holiday eating strategies .  Alexandria Ward has agreed to follow-up with our clinic in 4 weeks. She was informed of the importance of frequent follow-up visits to maximize her success with intensive lifestyle modifications for her multiple health conditions.   Objective:   Blood pressure 107/69, pulse 93, temperature 97.7 F (36.5 C), height 5\' 1"  (1.549 m), weight 174 lb (78.9 kg), SpO2 97 %. Body mass index is 32.88 kg/m.  General: Cooperative, alert, well developed, in no acute distress. HEENT: Conjunctivae and lids unremarkable. Cardiovascular: Regular rhythm.  Lungs: Normal work of breathing. Neurologic: No focal deficits.   Lab Results  Component Value Date   CREATININE 0.9 04/24/2020   BUN 14 04/24/2020   NA 147 04/24/2020   K 4.3  04/24/2020   CL 110 (A) 04/24/2020   CO2 28 (A) 04/24/2020   Lab Results  Component Value Date   ALT 26 04/24/2020   AST 14 04/24/2020   ALKPHOS 78 04/24/2020   BILITOT 0.2 09/19/2019   Lab Results  Component Value Date   HGBA1C 5.5 04/24/2020   HGBA1C 5.6 09/19/2019    HGBA1C 5.8 (H) 07/14/2019   Lab Results  Component Value Date   INSULIN 4.8 09/19/2019   INSULIN 12.3 07/14/2019   Lab Results  Component Value Date   TSH 2.41 04/24/2020   Lab Results  Component Value Date   CHOL 173 04/24/2020   HDL 55 04/24/2020   LDLCALC 111 04/24/2020   TRIG 33 (A) 04/24/2020   Lab Results  Component Value Date   WBC 6.1 04/24/2020   HGB 13.6 04/24/2020   HCT 42 04/24/2020   MCV 98 (H) 09/19/2019   PLT 297 04/24/2020   No results found for: IRON, TIBC, FERRITIN  Attestation Statements:   Reviewed by clinician on day of visit: allergies, medications, problem list, medical history, surgical history, family history, social history, and previous encounter notes.   I, Burt Knack, am acting as transcriptionist for Quillian Quince, MD.  I have reviewed the above documentation for accuracy and completeness, and I agree with the above. -  Quillian Quince, MD

## 2020-06-05 DIAGNOSIS — Z01419 Encounter for gynecological examination (general) (routine) without abnormal findings: Secondary | ICD-10-CM | POA: Diagnosis not present

## 2020-06-05 DIAGNOSIS — Z1231 Encounter for screening mammogram for malignant neoplasm of breast: Secondary | ICD-10-CM | POA: Diagnosis not present

## 2020-06-05 DIAGNOSIS — Z6833 Body mass index (BMI) 33.0-33.9, adult: Secondary | ICD-10-CM | POA: Diagnosis not present

## 2020-06-17 ENCOUNTER — Encounter (INDEPENDENT_AMBULATORY_CARE_PROVIDER_SITE_OTHER): Payer: Self-pay | Admitting: Family Medicine

## 2020-06-20 ENCOUNTER — Ambulatory Visit (INDEPENDENT_AMBULATORY_CARE_PROVIDER_SITE_OTHER): Payer: BC Managed Care – PPO | Admitting: Family Medicine

## 2020-07-11 ENCOUNTER — Other Ambulatory Visit: Payer: Self-pay

## 2020-07-11 ENCOUNTER — Ambulatory Visit (INDEPENDENT_AMBULATORY_CARE_PROVIDER_SITE_OTHER): Payer: BC Managed Care – PPO | Admitting: Family Medicine

## 2020-07-11 ENCOUNTER — Encounter (INDEPENDENT_AMBULATORY_CARE_PROVIDER_SITE_OTHER): Payer: Self-pay | Admitting: Family Medicine

## 2020-07-11 VITALS — BP 114/74 | HR 88 | Temp 98.2°F | Ht 61.0 in | Wt 175.0 lb

## 2020-07-11 DIAGNOSIS — Z9189 Other specified personal risk factors, not elsewhere classified: Secondary | ICD-10-CM | POA: Diagnosis not present

## 2020-07-11 DIAGNOSIS — E669 Obesity, unspecified: Secondary | ICD-10-CM

## 2020-07-11 DIAGNOSIS — R7303 Prediabetes: Secondary | ICD-10-CM | POA: Diagnosis not present

## 2020-07-11 DIAGNOSIS — Z6833 Body mass index (BMI) 33.0-33.9, adult: Secondary | ICD-10-CM | POA: Diagnosis not present

## 2020-07-11 DIAGNOSIS — E559 Vitamin D deficiency, unspecified: Secondary | ICD-10-CM

## 2020-07-11 MED ORDER — VITAMIN D (ERGOCALCIFEROL) 1.25 MG (50000 UNIT) PO CAPS: 50000.0000 [IU] | ORAL_CAPSULE | ORAL | 0 refills | Status: DC

## 2020-07-11 NOTE — Progress Notes (Signed)
Chief Complaint:   OBESITY Alexandria Ward is here to discuss her progress with her obesity treatment plan along with follow-up of her obesity related diagnoses. Alexandria Ward is on the Category 1 Plan and states she is following her eating plan approximately 70% of the time. Alexandria Ward states she is doing 0 minutes 0 times per week.  Today's visit was #: 16 Starting weight: 206 lbs Starting date: 07/14/2019 Today's weight: 175 lbs Today's date: 07/11/2020 Total lbs lost to date: 31 Total lbs lost since last in-office visit: 0  Interim History: Alexandria Ward has had a lot of stress in her life for the last few weeks, and she hasn't been able to concentrate on her eating as much. She has still been mindful however and still has made better choices.  Subjective:   1. Vitamin D deficiency Alexandria Ward is stable on Vit D, and she requests a refill today.  2. Pre-diabetes Alexandria Ward notes increased stress and increased simple carbohydrates. This may be contributing to increased polyphagia and increased fat strange.  3. At risk for impaired metabolic function Alexandria Ward is at increased risk for impaired metabolic function due to current nutrition and muscle mass.  Assessment/Plan:   1. Vitamin D deficiency Low Vitamin D level contributes to fatigue and are associated with obesity, breast, and colon cancer. We will refill prescription Vitamin D for 1 month, and we will recheck labs in 1 month. Alexandria Ward will follow-up for routine testing of Vitamin D, at least 2-3 times per year to avoid over-replacement.  - Vitamin D, Ergocalciferol, (DRISDOL) 1.25 MG (50000 UNIT) CAPS capsule; Take 1 capsule (50,000 Units total) by mouth every 7 (seven) days.  Dispense: 4 capsule; Refill: 0  2. Pre-diabetes Alexandria Ward will continue to work on weight loss, diet, exercise, increase protein, and decreasing simple carbohydrates to help decrease the risk of diabetes. We will plan to recheck labs in 1 month.  3. At risk for impaired metabolic function Alexandria Ward  was given approximately 15 minutes of impaired  metabolic function prevention counseling today. We discussed intensive lifestyle modifications today with an emphasis on specific nutrition and exercise instructions and strategies.   Repetitive spaced learning was employed today to elicit superior memory formation and behavioral change.  4. Class 1 obesity with serious comorbidity and body mass index (BMI) of 33.0 to 33.9 in adult, unspecified obesity type Alexandria Ward is currently in the action stage of change. As such, her goal is to continue with weight loss efforts. She has agreed to the Category 1 Plan.   Behavioral modification strategies: meal planning and cooking strategies and emotional eating strategies.  Alexandria Ward has agreed to follow-up with our clinic in 4 weeks. She was informed of the importance of frequent follow-up visits to maximize her success with intensive lifestyle modifications for her multiple health conditions.   Objective:   Blood pressure 114/74, pulse 88, temperature 98.2 F (36.8 C), height 5\' 1"  (1.549 m), weight 175 lb (79.4 kg), SpO2 97 %. Body mass index is 33.07 kg/m.  General: Cooperative, alert, well developed, in no acute distress. HEENT: Conjunctivae and lids unremarkable. Cardiovascular: Regular rhythm.  Lungs: Normal work of breathing. Neurologic: No focal deficits.   Lab Results  Component Value Date   CREATININE 0.9 04/24/2020   BUN 14 04/24/2020   NA 147 04/24/2020   K 4.3 04/24/2020   CL 110 (A) 04/24/2020   CO2 28 (A) 04/24/2020   Lab Results  Component Value Date   ALT 26 04/24/2020   AST 14 04/24/2020  ALKPHOS 78 04/24/2020   BILITOT 0.2 09/19/2019   Lab Results  Component Value Date   HGBA1C 5.5 04/24/2020   HGBA1C 5.6 09/19/2019   HGBA1C 5.8 (H) 07/14/2019   Lab Results  Component Value Date   INSULIN 4.8 09/19/2019   INSULIN 12.3 07/14/2019   Lab Results  Component Value Date   TSH 2.41 04/24/2020   Lab Results  Component  Value Date   CHOL 173 04/24/2020   HDL 55 04/24/2020   LDLCALC 111 04/24/2020   TRIG 33 (A) 04/24/2020   Lab Results  Component Value Date   WBC 6.1 04/24/2020   HGB 13.6 04/24/2020   HCT 42 04/24/2020   MCV 98 (H) 09/19/2019   PLT 297 04/24/2020   No results found for: IRON, TIBC, FERRITIN  Attestation Statements:   Reviewed by clinician on day of visit: allergies, medications, problem list, medical history, surgical history, family history, social history, and previous encounter notes.   I, Burt Knack, am acting as transcriptionist for Quillian Quince, MD.  I have reviewed the above documentation for accuracy and completeness, and I agree with the above. -  Quillian Quince, MD

## 2020-08-13 ENCOUNTER — Ambulatory Visit (INDEPENDENT_AMBULATORY_CARE_PROVIDER_SITE_OTHER): Payer: BC Managed Care – PPO | Admitting: Family Medicine

## 2020-08-13 ENCOUNTER — Encounter (INDEPENDENT_AMBULATORY_CARE_PROVIDER_SITE_OTHER): Payer: Self-pay | Admitting: Family Medicine

## 2020-08-13 ENCOUNTER — Other Ambulatory Visit: Payer: Self-pay

## 2020-08-13 VITALS — BP 97/62 | HR 76 | Temp 98.4°F | Ht 61.0 in | Wt 173.0 lb

## 2020-08-13 DIAGNOSIS — E669 Obesity, unspecified: Secondary | ICD-10-CM | POA: Diagnosis not present

## 2020-08-13 DIAGNOSIS — R7303 Prediabetes: Secondary | ICD-10-CM | POA: Diagnosis not present

## 2020-08-13 DIAGNOSIS — E7849 Other hyperlipidemia: Secondary | ICD-10-CM | POA: Diagnosis not present

## 2020-08-13 DIAGNOSIS — E559 Vitamin D deficiency, unspecified: Secondary | ICD-10-CM

## 2020-08-13 DIAGNOSIS — Z9189 Other specified personal risk factors, not elsewhere classified: Secondary | ICD-10-CM | POA: Diagnosis not present

## 2020-08-13 DIAGNOSIS — Z6832 Body mass index (BMI) 32.0-32.9, adult: Secondary | ICD-10-CM

## 2020-08-14 LAB — HEMOGLOBIN A1C
Est. average glucose Bld gHb Est-mCnc: 114 mg/dL
Hgb A1c MFr Bld: 5.6 % (ref 4.8–5.6)

## 2020-08-14 LAB — LIPID PANEL WITH LDL/HDL RATIO
Cholesterol, Total: 192 mg/dL (ref 100–199)
HDL: 58 mg/dL (ref >39)
LDL Chol Calc (NIH): 122 mg/dL — ABNORMAL HIGH (ref 0–99)
LDL/HDL Ratio: 2.1 ratio (ref 0.0–3.2)
Triglycerides: 62 mg/dL (ref 0–149)
VLDL Cholesterol Cal: 12 mg/dL (ref 5–40)

## 2020-08-14 LAB — COMPREHENSIVE METABOLIC PANEL
ALT: 18 IU/L (ref 0–32)
AST: 20 IU/L (ref 0–40)
Albumin/Globulin Ratio: 1.9 (ref 1.2–2.2)
Albumin: 4.3 g/dL (ref 3.8–4.9)
Alkaline Phosphatase: 86 IU/L (ref 44–121)
BUN/Creatinine Ratio: 21 (ref 9–23)
BUN: 14 mg/dL (ref 6–24)
Bilirubin Total: 0.3 mg/dL (ref 0.0–1.2)
CO2: 23 mmol/L (ref 20–29)
Calcium: 10.1 mg/dL (ref 8.7–10.2)
Chloride: 107 mmol/L — ABNORMAL HIGH (ref 96–106)
Creatinine, Ser: 0.66 mg/dL (ref 0.57–1.00)
Globulin, Total: 2.3 g/dL (ref 1.5–4.5)
Glucose: 83 mg/dL (ref 65–99)
Potassium: 4.4 mmol/L (ref 3.5–5.2)
Sodium: 144 mmol/L (ref 134–144)
Total Protein: 6.6 g/dL (ref 6.0–8.5)
eGFR: 103 mL/min/{1.73_m2} (ref >59)

## 2020-08-14 LAB — VITAMIN D 25 HYDROXY (VIT D DEFICIENCY, FRACTURES): Vit D, 25-Hydroxy: 50.5 ng/mL (ref 30.0–100.0)

## 2020-08-14 LAB — INSULIN, RANDOM: INSULIN: 6.8 u[IU]/mL (ref 2.6–24.9)

## 2020-08-14 NOTE — Progress Notes (Signed)
Chief Complaint:   OBESITY Alexandria Ward is here to discuss her progress with her obesity treatment plan along with follow-up of her obesity related diagnoses. Alexandria Ward is on the Category 1 Plan and states she is following her eating plan approximately 60% of the time. Alexandria Ward states she is doing 0 minutes 0 times per week.  Today's visit was #: 17 Starting weight: 206 lbs Starting date: 07/14/2019 Today's weight: 173 lbs Today's date: 08/13/2020 Total lbs lost to date: 33 Total lbs lost since last in-office visit: 2  Interim History: Alexandria Ward continues to do well with weight loss. She notes increased stress at home and with work and was pleasantly surprised at her weight loss. She is deviating more from her plan, but still mindful of her food choices. Her simple carbohydrates seem to be creeping up however.  Subjective:   1. Vitamin D deficiency Alexandria Ward is on Vit D, and she is due for labs. She denies signs of over-replacement.  2. Other hyperlipidemia Alexandria Ward is working on diet. Her last LDL was above goal but had been improving.  3. Pre-diabetes Alexandria Ward is working on decreasing simple carbohydrates, but she has struggled more recently. She is due for labs.  4. At risk for heart disease Alexandria Ward is at a higher than average risk for cardiovascular disease due to obesity.   Assessment/Plan:   1. Vitamin D deficiency Low Vitamin D level contributes to fatigue and are associated with obesity, breast, and colon cancer. We will check labs today. Elmo will follow-up for routine testing of Vitamin D, at least 2-3 times per year to avoid over-replacement.  - VITAMIN D 25 Hydroxy (Vit-D Deficiency, Fractures)  2. Other hyperlipidemia Cardiovascular risk and specific lipid/LDL goals reviewed. We discussed several lifestyle modifications today and Alexandria Ward will continue to work on diet, exercise and weight loss efforts. We will check labs today. Orders and follow up as documented in patient record.   - Lipid  Panel With LDL/HDL Ratio  3. Pre-diabetes Alexandria Ward will continue to work on weight loss, exercise, and decreasing simple carbohydrates to help decrease the risk of diabetes. We will check labs today.  - Comprehensive metabolic panel - Insulin, random - Hemoglobin A1c  4. At risk for heart disease Alexandria Ward was given approximately 15 minutes of coronary artery disease prevention counseling today. She is 55 y.o. female and has risk factors for heart disease including obesity. We discussed intensive lifestyle modifications today with an emphasis on specific weight loss instructions and strategies.   Repetitive spaced learning was employed today to elicit superior memory formation and behavioral change.  5. Class 1 obesity with serious comorbidity and body mass index (BMI) of 32.0 to 32.9 in adult, unspecified obesity type Alexandria Ward is currently in the action stage of change. As such, her goal is to continue with weight loss efforts. She has agreed to the Category 1 Plan or keeping a food journal and adhering to recommended goals of 1100-1200 calories and 80+ grams of protein daily, with less than 30 grams of sugar.  Behavioral modification strategies: increasing lean protein intake and decreasing simple carbohydrates.  Alexandria Ward has agreed to follow-up with our clinic in 4 weeks. She was informed of the importance of frequent follow-up visits to maximize her success with intensive lifestyle modifications for her multiple health conditions.   Alexandria Ward was informed we would discuss her lab results at her next visit unless there is a critical issue that needs to be addressed sooner. Alexandria Ward agreed to keep her next  visit at the agreed upon time to discuss these results.  Objective:   Blood pressure 97/62, pulse 76, temperature 98.4 F (36.9 C), height 5\' 1"  (1.549 m), weight 173 lb (78.5 kg), SpO2 95 %. Body mass index is 32.69 kg/m.  General: Cooperative, alert, well developed, in no acute distress. HEENT:  Conjunctivae and lids unremarkable. Cardiovascular: Regular rhythm.  Lungs: Normal work of breathing. Neurologic: No focal deficits.   Lab Results  Component Value Date   CREATININE 0.66 08/13/2020   BUN 14 08/13/2020   NA 144 08/13/2020   K 4.4 08/13/2020   CL 107 (H) 08/13/2020   CO2 23 08/13/2020   Lab Results  Component Value Date   ALT 18 08/13/2020   AST 20 08/13/2020   ALKPHOS 86 08/13/2020   BILITOT 0.3 08/13/2020   Lab Results  Component Value Date   HGBA1C 5.6 08/13/2020   HGBA1C 5.5 04/24/2020   HGBA1C 5.6 09/19/2019   HGBA1C 5.8 (H) 07/14/2019   Lab Results  Component Value Date   INSULIN 6.8 08/13/2020   INSULIN 4.8 09/19/2019   INSULIN 12.3 07/14/2019   Lab Results  Component Value Date   TSH 2.41 04/24/2020   Lab Results  Component Value Date   CHOL 192 08/13/2020   HDL 58 08/13/2020   LDLCALC 122 (H) 08/13/2020   TRIG 62 08/13/2020   Lab Results  Component Value Date   WBC 6.1 04/24/2020   HGB 13.6 04/24/2020   HCT 42 04/24/2020   MCV 98 (H) 09/19/2019   PLT 297 04/24/2020   No results found for: IRON, TIBC, FERRITIN  Attestation Statements:   Reviewed by clinician on day of visit: allergies, medications, problem list, medical history, surgical history, family history, social history, and previous encounter notes.   I, 13/02/2020, am acting as transcriptionist for Burt Knack, MD.  I have reviewed the above documentation for accuracy and completeness, and I agree with the above. -  Quillian Quince, MD

## 2020-09-17 ENCOUNTER — Other Ambulatory Visit: Payer: Self-pay

## 2020-09-17 ENCOUNTER — Encounter (INDEPENDENT_AMBULATORY_CARE_PROVIDER_SITE_OTHER): Payer: Self-pay | Admitting: Family Medicine

## 2020-09-17 ENCOUNTER — Ambulatory Visit (INDEPENDENT_AMBULATORY_CARE_PROVIDER_SITE_OTHER): Payer: BC Managed Care – PPO | Admitting: Family Medicine

## 2020-09-17 VITALS — BP 117/73 | HR 88 | Temp 97.7°F | Ht 61.0 in | Wt 178.0 lb

## 2020-09-17 DIAGNOSIS — F3289 Other specified depressive episodes: Secondary | ICD-10-CM | POA: Diagnosis not present

## 2020-09-17 DIAGNOSIS — E559 Vitamin D deficiency, unspecified: Secondary | ICD-10-CM | POA: Diagnosis not present

## 2020-09-17 DIAGNOSIS — Z9189 Other specified personal risk factors, not elsewhere classified: Secondary | ICD-10-CM

## 2020-09-17 DIAGNOSIS — E7800 Pure hypercholesterolemia, unspecified: Secondary | ICD-10-CM

## 2020-09-17 DIAGNOSIS — E66812 Obesity, class 2: Secondary | ICD-10-CM

## 2020-09-17 DIAGNOSIS — Z6838 Body mass index (BMI) 38.0-38.9, adult: Secondary | ICD-10-CM

## 2020-09-17 DIAGNOSIS — E7849 Other hyperlipidemia: Secondary | ICD-10-CM | POA: Diagnosis not present

## 2020-09-17 MED ORDER — BUPROPION HCL ER (SR) 150 MG PO TB12: 150.0000 mg | ORAL_TABLET | Freq: Every morning | ORAL | 0 refills | Status: DC

## 2020-09-19 DIAGNOSIS — H01006 Unspecified blepharitis left eye, unspecified eyelid: Secondary | ICD-10-CM | POA: Diagnosis not present

## 2020-09-19 DIAGNOSIS — H0288A Meibomian gland dysfunction right eye, upper and lower eyelids: Secondary | ICD-10-CM | POA: Diagnosis not present

## 2020-09-19 DIAGNOSIS — H2513 Age-related nuclear cataract, bilateral: Secondary | ICD-10-CM | POA: Diagnosis not present

## 2020-09-19 DIAGNOSIS — H04123 Dry eye syndrome of bilateral lacrimal glands: Secondary | ICD-10-CM | POA: Diagnosis not present

## 2020-10-01 DIAGNOSIS — Z0000 Encounter for general adult medical examination without abnormal findings: Secondary | ICD-10-CM | POA: Diagnosis not present

## 2020-10-01 DIAGNOSIS — E039 Hypothyroidism, unspecified: Secondary | ICD-10-CM | POA: Diagnosis not present

## 2020-10-01 DIAGNOSIS — E559 Vitamin D deficiency, unspecified: Secondary | ICD-10-CM | POA: Diagnosis not present

## 2020-10-02 NOTE — Progress Notes (Signed)
Chief Complaint:   OBESITY Alexandria Ward is here to discuss her progress with her obesity treatment plan along with follow-up of her obesity related diagnoses. Alexandria Ward is on keeping a food journal and adhering to recommended goals of 1100-1200 calories and 80+ grams of protein daily, with less than 30 grams of sugar and states she is following her eating plan approximately 50-60% of the time. Alexandria Ward states she is doing 0 minutes 0 times per week.  Today's visit was #: 18 Starting weight: 206 lbs Starting date: 07/14/2019 Today's weight: 178 lbs Today's date: 09/17/2020 Total lbs lost to date: 28 Total lbs lost since last in-office visit: 0  Interim History: Alexandria Ward has been dealing with a lot of stress and changes in her life and work. She hasn't been able to meal plan and prep as much, and she is feeling frustrated.  Subjective:   1. Vitamin D deficiency Alexandria Ward is on Vit D prescription, and last Vit D was at goal. She denies nausea or vomiting.  2. Hyperlipidemia, pure Alexandria Ward's last LDL was above goal. She is not on statin, and she is working on diet and weight loss.  3. Other depression with emotional eating Alexandria Ward has increased stress and she is feeling scattered, and a bit overwhelmed. She hasn't been able to plan her meals as well either.  4. At risk for heart disease Alexandria Ward is at a higher than average risk for cardiovascular disease due to obesity.   Assessment/Plan:   1. Vitamin D deficiency Low Vitamin D level contributes to fatigue and are associated with obesity, breast, and colon cancer. Alexandria Ward agreed to continue taking prescription Vitamin D 50,000 IU every week and will follow-up for routine testing of Vitamin D, at least 2-3 times per year to avoid over-replacement.  2. Hyperlipidemia, pure Cardiovascular risk and specific lipid/LDL goals reviewed. We discussed several lifestyle modifications today. Alexandria Ward will continue to work on diet, exercise and weight loss efforts. Alexandria Ward is to  discuss statin and heart disease risk with her primary care physician. Orders and follow up as documented in patient record.   3. Other depression with emotional eating Behavior modification techniques were discussed today to help Alexandria Ward deal with her emotional/non-hunger eating behaviors. Alexandria Ward agreed to start Wellbutrin SR 150 mg q AM with no refills. Orders and follow up as documented in patient record.   - buPROPion (WELLBUTRIN SR) 150 MG 12 hr tablet; Take 1 tablet (150 mg total) by mouth in the morning.  Dispense: 30 tablet; Refill: 0  4. At risk for heart disease Alexandria Ward was given approximately 15 minutes of coronary artery disease prevention counseling today. She is 55 y.o. female and has risk factors for heart disease including obesity. We discussed intensive lifestyle modifications today with an emphasis on specific weight loss instructions and strategies.   Repetitive spaced learning was employed today to elicit superior memory formation and behavioral change.  5. Obesity with current BMI of 33.7 Alexandria Ward is currently in the action stage of change. As such, her goal is to continue with weight loss efforts. She has agreed to the Category 1 Plan and keeping a food journal and adhering to recommended goals of 300-400 calories and 25+ grams of protein at lunch and supper daily.   Behavioral modification strategies: increasing lean protein intake.  Alexandria Ward has agreed to follow-up with our clinic in 4 weeks. She was informed of the importance of frequent follow-up visits to maximize her success with intensive lifestyle modifications for her multiple health  conditions.   Objective:   Blood pressure 117/73, pulse 88, temperature 97.7 F (36.5 C), height 5\' 1"  (1.549 m), weight 178 lb (80.7 kg), SpO2 97 %. Body mass index is 33.63 kg/m.  General: Cooperative, alert, well developed, in no acute distress. HEENT: Conjunctivae and lids unremarkable. Cardiovascular: Regular rhythm.  Lungs: Normal  work of breathing. Neurologic: No focal deficits.   Lab Results  Component Value Date   CREATININE 0.66 08/13/2020   BUN 14 08/13/2020   NA 144 08/13/2020   K 4.4 08/13/2020   CL 107 (H) 08/13/2020   CO2 23 08/13/2020   Lab Results  Component Value Date   ALT 18 08/13/2020   AST 20 08/13/2020   ALKPHOS 86 08/13/2020   BILITOT 0.3 08/13/2020   Lab Results  Component Value Date   HGBA1C 5.6 08/13/2020   HGBA1C 5.5 04/24/2020   HGBA1C 5.6 09/19/2019   HGBA1C 5.8 (H) 07/14/2019   Lab Results  Component Value Date   INSULIN 6.8 08/13/2020   INSULIN 4.8 09/19/2019   INSULIN 12.3 07/14/2019   Lab Results  Component Value Date   TSH 2.41 04/24/2020   Lab Results  Component Value Date   CHOL 192 08/13/2020   HDL 58 08/13/2020   LDLCALC 122 (H) 08/13/2020   TRIG 62 08/13/2020   Lab Results  Component Value Date   WBC 6.1 04/24/2020   HGB 13.6 04/24/2020   HCT 42 04/24/2020   MCV 98 (H) 09/19/2019   PLT 297 04/24/2020   No results found for: IRON, TIBC, FERRITIN  Attestation Statements:   Reviewed by clinician on day of visit: allergies, medications, problem list, medical history, surgical history, family history, social history, and previous encounter notes.   I, 13/02/2020, am acting as transcriptionist for Burt Knack, MD.  I have reviewed the above documentation for accuracy and completeness, and I agree with the above. -  Quillian Quince, MD

## 2020-10-22 ENCOUNTER — Encounter (INDEPENDENT_AMBULATORY_CARE_PROVIDER_SITE_OTHER): Payer: Self-pay | Admitting: Family Medicine

## 2020-10-22 ENCOUNTER — Ambulatory Visit (INDEPENDENT_AMBULATORY_CARE_PROVIDER_SITE_OTHER): Payer: BC Managed Care – PPO | Admitting: Family Medicine

## 2020-10-22 NOTE — Telephone Encounter (Signed)
Dr.Beasley 

## 2020-11-06 ENCOUNTER — Other Ambulatory Visit: Payer: Self-pay

## 2020-11-06 ENCOUNTER — Encounter (INDEPENDENT_AMBULATORY_CARE_PROVIDER_SITE_OTHER): Payer: Self-pay | Admitting: Family Medicine

## 2020-11-06 ENCOUNTER — Ambulatory Visit (INDEPENDENT_AMBULATORY_CARE_PROVIDER_SITE_OTHER): Payer: 59 | Admitting: Family Medicine

## 2020-11-06 VITALS — BP 103/67 | HR 90 | Temp 98.1°F | Ht 61.0 in | Wt 183.0 lb

## 2020-11-06 DIAGNOSIS — E559 Vitamin D deficiency, unspecified: Secondary | ICD-10-CM

## 2020-11-06 DIAGNOSIS — Z9189 Other specified personal risk factors, not elsewhere classified: Secondary | ICD-10-CM | POA: Diagnosis not present

## 2020-11-06 DIAGNOSIS — Z6838 Body mass index (BMI) 38.0-38.9, adult: Secondary | ICD-10-CM

## 2020-11-06 DIAGNOSIS — F3289 Other specified depressive episodes: Secondary | ICD-10-CM

## 2020-11-06 DIAGNOSIS — G43909 Migraine, unspecified, not intractable, without status migrainosus: Secondary | ICD-10-CM | POA: Diagnosis not present

## 2020-11-06 MED ORDER — ATOMOXETINE HCL 25 MG PO CAPS: 25.0000 mg | ORAL_CAPSULE | Freq: Every morning | ORAL | 0 refills | Status: DC

## 2020-11-06 MED ORDER — VITAMIN D (ERGOCALCIFEROL) 1.25 MG (50000 UNIT) PO CAPS: 50000.0000 [IU] | ORAL_CAPSULE | ORAL | 0 refills | Status: DC

## 2020-11-07 MED ORDER — ATOMOXETINE HCL 25 MG PO CAPS: 25.0000 mg | ORAL_CAPSULE | Freq: Every morning | ORAL | 0 refills | Status: DC

## 2020-11-07 NOTE — Telephone Encounter (Signed)
Pt last seen by Dr. Beasley.  

## 2020-11-07 NOTE — Progress Notes (Signed)
Chief Complaint:   OBESITY Alexandria Ward is here to discuss her progress with her obesity treatment plan along with follow-up of her obesity related diagnoses. Alexandria Ward is on the Category 1 Plan and keeping a food journal and adhering to recommended goals of 300-400 calories and 25+ grams of protein at lunch and dinner daily and states she is following her eating plan approximately 50% of the time. Alexandria Ward states she is doing 0 minutes 0 times per week.  Today's visit was #: 19 Starting weight: 206 lbs Starting date: 07/14/2019 Today's weight: 183 lbs Today's date: 11/06/2020 Total lbs lost to date: 23 Total lbs lost since last in-office visit: 0  Interim History: Alexandria Ward has been off track with her eating plan especially while dealing with a lot of stress. She has tried to make better choices but she has felt scattered and overwhelmed at times. She is ready to get back on track.  Subjective:   1. Vitamin D deficiency Alexandria Ward is stable on Vit D, and she requests a refill. She notes fatigue.  2. Migraine without status migrainosus, not intractable, unspecified migraine type Alexandria Ward has a history of migraine headache, which are not controlled. She has not seen a Neurologist in a while and she is open to this as her headaches are affecting her day to day choices.  3. Other depression with emotional eating Alexandria Ward stopped Wellbutrin a while back as she felt it change her headache. She feels she may have a degree of ADD, but was never diagnosed. She notes increased stress and comfort eating in the last month or more.  4. At risk for heart disease Alexandria Ward is at a higher than average risk for cardiovascular disease due to obesity.   Assessment/Plan:   1. Vitamin D deficiency Low Vitamin D level contributes to fatigue and are associated with obesity, breast, and colon cancer. We will refill prescription Vitamin D for 1 month. Alexandria Ward will follow-up for routine testing of Vitamin D, at least 2-3 times per year  to avoid over-replacement.  - Vitamin D, Ergocalciferol, (DRISDOL) 1.25 MG (50000 UNIT) CAPS capsule; Take 1 capsule (50,000 Units total) by mouth every 7 (seven) days.  Dispense: 4 capsule; Refill: 0  2. Migraine without status migrainosus, not intractable, unspecified migraine type We will refer Alexandria Ward to Alexandria Ward, Alexandria Ward for evaluation and treatment.  - Ambulatory referral to Neurology  3. Other depression with emotional eating Behavior modification techniques were discussed today to help Alexandria Ward deal with her emotional/non-hunger eating behaviors. Alexandria Ward agreed to start Strattera 25 mg q AM with no refills, and we will follow up in 4 weeks. Orders and follow up as documented in patient record.   - atomoxetine (STRATTERA) 25 MG capsule; Take 1 capsule (25 mg total) by mouth every morning.  Dispense: 30 capsule; Refill: 0  4. At risk for heart disease Alexandria Ward was given approximately 15 minutes of coronary artery disease prevention counseling today. She is 55 y.o. female and has risk factors for heart disease including obesity. We discussed intensive lifestyle modifications today with an emphasis on specific weight loss instructions and strategies.   Repetitive spaced learning was employed today to elicit superior memory formation and behavioral change.  5. Obesity with current BMI of 34.7 Alexandria Ward is currently in the action stage of change. As such, her goal is to continue with weight loss efforts. She has agreed to the Category 1 Plan.   Behavioral modification strategies: increasing lean protein intake and meal planning and cooking strategies.  Alexandria Ward has agreed to follow-up with our clinic in 4 weeks. She was informed of the importance of frequent follow-up visits to maximize her success with intensive lifestyle modifications for her multiple health conditions.   Objective:   Blood pressure 103/67, pulse 90, temperature 98.1 F (36.7 C), height 5\' 1"  (1.549 m), weight 183 lb (83 kg), SpO2 99  %. Body mass index is 34.58 kg/m.  General: Cooperative, alert, well developed, in no acute distress. HEENT: Conjunctivae and lids unremarkable. Cardiovascular: Regular rhythm.  Lungs: Normal work of breathing. Neurologic: No focal deficits.   Lab Results  Component Value Date   CREATININE 0.66 08/13/2020   BUN 14 08/13/2020   NA 144 08/13/2020   K 4.4 08/13/2020   CL 107 (H) 08/13/2020   CO2 23 08/13/2020   Lab Results  Component Value Date   ALT 18 08/13/2020   AST 20 08/13/2020   ALKPHOS 86 08/13/2020   BILITOT 0.3 08/13/2020   Lab Results  Component Value Date   HGBA1C 5.6 08/13/2020   HGBA1C 5.5 04/24/2020   HGBA1C 5.6 09/19/2019   HGBA1C 5.8 (H) 07/14/2019   Lab Results  Component Value Date   INSULIN 6.8 08/13/2020   INSULIN 4.8 09/19/2019   INSULIN 12.3 07/14/2019   Lab Results  Component Value Date   TSH 2.41 04/24/2020   Lab Results  Component Value Date   CHOL 192 08/13/2020   HDL 58 08/13/2020   LDLCALC 122 (H) 08/13/2020   TRIG 62 08/13/2020   Lab Results  Component Value Date   WBC 6.1 04/24/2020   HGB 13.6 04/24/2020   HCT 42 04/24/2020   MCV 98 (H) 09/19/2019   PLT 297 04/24/2020   No results found for: IRON, TIBC, FERRITIN  Attestation Statements:   Reviewed by clinician on day of visit: allergies, medications, problem list, medical history, surgical history, family history, social history, and previous encounter notes.   I, 13/02/2020, am acting as transcriptionist for Burt Knack, MD.  I have reviewed the above documentation for accuracy and completeness, and I agree with the above. -  Quillian Quince, MD

## 2020-11-30 ENCOUNTER — Other Ambulatory Visit (INDEPENDENT_AMBULATORY_CARE_PROVIDER_SITE_OTHER): Payer: Self-pay | Admitting: Family Medicine

## 2020-11-30 DIAGNOSIS — F3289 Other specified depressive episodes: Secondary | ICD-10-CM

## 2020-12-03 NOTE — Telephone Encounter (Signed)
Dr.Beasley 

## 2020-12-12 ENCOUNTER — Ambulatory Visit (INDEPENDENT_AMBULATORY_CARE_PROVIDER_SITE_OTHER): Payer: 59 | Admitting: Family Medicine

## 2020-12-12 ENCOUNTER — Encounter (INDEPENDENT_AMBULATORY_CARE_PROVIDER_SITE_OTHER): Payer: Self-pay | Admitting: Family Medicine

## 2020-12-12 ENCOUNTER — Other Ambulatory Visit: Payer: Self-pay

## 2020-12-12 VITALS — BP 121/83 | HR 73 | Temp 98.6°F | Ht 61.0 in | Wt 185.0 lb

## 2020-12-12 DIAGNOSIS — Z6838 Body mass index (BMI) 38.0-38.9, adult: Secondary | ICD-10-CM | POA: Diagnosis not present

## 2020-12-12 DIAGNOSIS — F3289 Other specified depressive episodes: Secondary | ICD-10-CM | POA: Diagnosis not present

## 2020-12-12 DIAGNOSIS — E559 Vitamin D deficiency, unspecified: Secondary | ICD-10-CM

## 2020-12-12 DIAGNOSIS — Z9189 Other specified personal risk factors, not elsewhere classified: Secondary | ICD-10-CM

## 2020-12-12 MED ORDER — BD PEN NEEDLE NANO U/F 32G X 4 MM MISC: 0 refills | Status: DC

## 2020-12-12 MED ORDER — SAXENDA 18 MG/3ML ~~LOC~~ SOPN: 3.0000 mg | PEN_INJECTOR | Freq: Every day | SUBCUTANEOUS | 0 refills | Status: AC

## 2020-12-12 MED ORDER — ATOMOXETINE HCL 25 MG PO CAPS: 25.0000 mg | ORAL_CAPSULE | Freq: Every morning | ORAL | 0 refills | Status: DC

## 2020-12-12 MED ORDER — VITAMIN D (ERGOCALCIFEROL) 1.25 MG (50000 UNIT) PO CAPS: 50000.0000 [IU] | ORAL_CAPSULE | ORAL | 0 refills | Status: DC

## 2020-12-12 NOTE — Telephone Encounter (Signed)
Dr.Beasley 

## 2020-12-18 NOTE — Telephone Encounter (Signed)
Ok x 1

## 2020-12-19 ENCOUNTER — Other Ambulatory Visit (INDEPENDENT_AMBULATORY_CARE_PROVIDER_SITE_OTHER): Payer: Self-pay | Admitting: Emergency Medicine

## 2020-12-19 DIAGNOSIS — K2100 Gastro-esophageal reflux disease with esophagitis, without bleeding: Secondary | ICD-10-CM

## 2020-12-19 MED ORDER — OMEPRAZOLE 40 MG PO CPDR: 40.0000 mg | DELAYED_RELEASE_CAPSULE | Freq: Every day | ORAL | 0 refills | Status: DC

## 2020-12-19 NOTE — Progress Notes (Signed)
Chief Complaint:   OBESITY Alexandria Ward is here to discuss her progress with her obesity treatment plan along with follow-up of her obesity related diagnoses. Alexandria Ward is on the Category 1 Plan and states she is following her eating plan approximately 65% of the time. Alexandria Ward states she is doing 0 minutes 0 times per week.  Today's visit was #: 20 Starting weight: 206 lbs Starting date: 07/14/2019 Today's weight: 185 lbs Today's date: 12/12/2020 Total lbs lost to date: 21 Total lbs lost since last in-office visit: 0  Interim History: Alexandria Ward has been slowly regaining the weight that she lost last year. She is up 17 lbs from her lowest weight last year, and she has had significant struggles with weight loss. She is open to looking at other medication options.  Subjective:   1. Vitamin D deficiency Alexandria Ward is stable on Vit D.  2. Other depression with emotional eating Alexandria Ward was changed from Wellbutrin to Nazlini, and she feels her mood is better and she feels more organized and calm.  3. At risk for nausea Alexandria Ward is at risk for nausea due to starting medication.  Assessment/Plan:   1. Vitamin D deficiency Low Vitamin D level contributes to fatigue and are associated with obesity, breast, and colon cancer. We will refill prescription Vitamin D for 1 month. Alexandria Ward will follow-up for routine testing of Vitamin D, at least 2-3 times per year to avoid over-replacement.  - Vitamin D, Ergocalciferol, (DRISDOL) 1.25 MG (50000 UNIT) CAPS capsule; Take 1 capsule (50,000 Units total) by mouth every 7 (seven) days.  Dispense: 4 capsule; Refill: 0  2. Other depression with emotional eating Behavior modification techniques were discussed today to help Alexandria Ward deal with her emotional/non-hunger eating behaviors. We will refill Strattera for 1 month. Orders and follow up as documented in patient record.   - atomoxetine (STRATTERA) 25 MG capsule; Take 1 capsule (25 mg total) by mouth every morning.  Dispense:  30 capsule; Refill: 0  3. At risk for nausea Alexandria Ward was given approximately 15 minutes of nausea prevention counseling today. Alexandria Ward is at risk for nausea due to her new or current medication. She was encouraged to titrate her medication slowly, make sure to stay hydrated, eat smaller portions throughout the day, and avoid high fat meals.   4. Obesity with the current BMI 35.0 Alexandria Ward is currently in the action stage of change. As such, her goal is to continue with weight loss efforts. She has agreed to the Category 1 Plan.   We discussed various medication options to help Alexandria Ward with her weight loss efforts and we both agreed to start Saxenda 3.0 mg daily with no refills (she is to start at 0.6 mg q AM), and pen needles #100 with no refills.  - Liraglutide -Weight Management (SAXENDA) 18 MG/3ML SOPN; Inject 3 mg into the skin daily.  Dispense: 15 mL; Refill: 0 - Insulin Pen Needle (BD PEN NEEDLE NANO U/F) 32G X 4 MM MISC; Use Nano needle with Ozempic  Dispense: 100 each; Refill: 0  Behavioral modification strategies: increasing lean protein intake.  Alexandria Ward has agreed to follow-up with our clinic in 3 to 4 weeks. She was informed of the importance of frequent follow-up visits to maximize her success with intensive lifestyle modifications for her multiple health conditions.   Objective:   Blood pressure 121/83, pulse 73, temperature 98.6 F (37 C), height 5\' 1"  (1.549 m), weight 185 lb (83.9 kg), SpO2 98 %. Body mass index is  34.96 kg/m.  General: Cooperative, alert, well developed, in no acute distress. HEENT: Conjunctivae and lids unremarkable. Cardiovascular: Regular rhythm.  Lungs: Normal work of breathing. Neurologic: No focal deficits.   Lab Results  Component Value Date   CREATININE 0.66 08/13/2020   BUN 14 08/13/2020   NA 144 08/13/2020   K 4.4 08/13/2020   CL 107 (H) 08/13/2020   CO2 23 08/13/2020   Lab Results  Component Value Date   ALT 18 08/13/2020   AST 20  08/13/2020   ALKPHOS 86 08/13/2020   BILITOT 0.3 08/13/2020   Lab Results  Component Value Date   HGBA1C 5.6 08/13/2020   HGBA1C 5.5 04/24/2020   HGBA1C 5.6 09/19/2019   HGBA1C 5.8 (H) 07/14/2019   Lab Results  Component Value Date   INSULIN 6.8 08/13/2020   INSULIN 4.8 09/19/2019   INSULIN 12.3 07/14/2019   Lab Results  Component Value Date   TSH 2.41 04/24/2020   Lab Results  Component Value Date   CHOL 192 08/13/2020   HDL 58 08/13/2020   LDLCALC 122 (H) 08/13/2020   TRIG 62 08/13/2020   Lab Results  Component Value Date   VD25OH 50.5 08/13/2020   VD25OH 42.4 01/18/2020   VD25OH 77.4 09/19/2019   Lab Results  Component Value Date   WBC 6.1 04/24/2020   HGB 13.6 04/24/2020   HCT 42 04/24/2020   MCV 98 (H) 09/19/2019   PLT 297 04/24/2020   No results found for: IRON, TIBC, FERRITIN  Attestation Statements:   Reviewed by clinician on day of visit: allergies, medications, problem list, medical history, surgical history, family history, social history, and previous encounter notes.   I, Burt Knack, am acting as transcriptionist for Quillian Quince, MD.  I have reviewed the above documentation for accuracy and completeness, and I agree with the above. -  Quillian Quince, MD

## 2021-01-08 ENCOUNTER — Other Ambulatory Visit (INDEPENDENT_AMBULATORY_CARE_PROVIDER_SITE_OTHER): Payer: Self-pay | Admitting: Family Medicine

## 2021-01-08 DIAGNOSIS — F3289 Other specified depressive episodes: Secondary | ICD-10-CM

## 2021-01-08 NOTE — Telephone Encounter (Signed)
Will refill at 01/09/21 visit

## 2021-01-08 NOTE — Telephone Encounter (Signed)
Pt last seen by Dr. Beasley.  

## 2021-01-09 ENCOUNTER — Other Ambulatory Visit: Payer: Self-pay

## 2021-01-09 ENCOUNTER — Encounter (INDEPENDENT_AMBULATORY_CARE_PROVIDER_SITE_OTHER): Payer: Self-pay | Admitting: Family Medicine

## 2021-01-09 ENCOUNTER — Ambulatory Visit (INDEPENDENT_AMBULATORY_CARE_PROVIDER_SITE_OTHER): Payer: 59 | Admitting: Family Medicine

## 2021-01-09 VITALS — BP 106/70 | HR 80 | Temp 98.3°F | Ht 61.0 in | Wt 180.0 lb

## 2021-01-09 DIAGNOSIS — Z9189 Other specified personal risk factors, not elsewhere classified: Secondary | ICD-10-CM | POA: Diagnosis not present

## 2021-01-09 DIAGNOSIS — F908 Attention-deficit hyperactivity disorder, other type: Secondary | ICD-10-CM | POA: Diagnosis not present

## 2021-01-09 DIAGNOSIS — F3289 Other specified depressive episodes: Secondary | ICD-10-CM

## 2021-01-09 DIAGNOSIS — Z6838 Body mass index (BMI) 38.0-38.9, adult: Secondary | ICD-10-CM

## 2021-01-09 DIAGNOSIS — E559 Vitamin D deficiency, unspecified: Secondary | ICD-10-CM | POA: Diagnosis not present

## 2021-01-09 DIAGNOSIS — K2100 Gastro-esophageal reflux disease with esophagitis, without bleeding: Secondary | ICD-10-CM | POA: Diagnosis not present

## 2021-01-10 ENCOUNTER — Other Ambulatory Visit (INDEPENDENT_AMBULATORY_CARE_PROVIDER_SITE_OTHER): Payer: Self-pay | Admitting: Family Medicine

## 2021-01-10 ENCOUNTER — Encounter (INDEPENDENT_AMBULATORY_CARE_PROVIDER_SITE_OTHER): Payer: Self-pay | Admitting: Family Medicine

## 2021-01-10 DIAGNOSIS — E559 Vitamin D deficiency, unspecified: Secondary | ICD-10-CM

## 2021-01-10 DIAGNOSIS — K2100 Gastro-esophageal reflux disease with esophagitis, without bleeding: Secondary | ICD-10-CM

## 2021-01-10 DIAGNOSIS — F3289 Other specified depressive episodes: Secondary | ICD-10-CM

## 2021-01-10 MED ORDER — ATOMOXETINE HCL 25 MG PO CAPS: 25.0000 mg | ORAL_CAPSULE | Freq: Every morning | ORAL | 0 refills | Status: DC

## 2021-01-10 MED ORDER — VITAMIN D (ERGOCALCIFEROL) 1.25 MG (50000 UNIT) PO CAPS: 50000.0000 [IU] | ORAL_CAPSULE | ORAL | 0 refills | Status: DC

## 2021-01-10 MED ORDER — OMEPRAZOLE 40 MG PO CPDR: 40.0000 mg | DELAYED_RELEASE_CAPSULE | Freq: Every day | ORAL | 0 refills | Status: DC

## 2021-01-10 NOTE — Telephone Encounter (Signed)
Dr.Beasley 

## 2021-01-15 NOTE — Progress Notes (Signed)
Chief Complaint:   OBESITY Alexandria Ward is here to discuss her progress with her obesity treatment plan along with follow-up of her obesity related diagnoses. Alexandria Ward is on the Category 1 Plan and states she is following her eating plan approximately 70% of the time. Alexandria Ward states she is doing 0 minutes 0 times per week.  Today's visit was #: 21 Starting weight: 206 lbs Starting date: 07/14/2019 Today's weight: 180 lbs Today's date: 01/09/2021 Total lbs lost to date: 26 Total lbs lost since last in-office visit: 5  Interim History: Alexandria Ward started Saxenda and she noted worsening GERD, reflux, and nausea so she stopped. She felt she did better with weight loss however and she is willing to try a lower dose.  Subjective:   1. Vitamin D deficiency Alexandria Ward is stable on Vit D, and she denies signs of over-replacement.  2. Attention deficit hyperactivity disorder (ADHD), other type Alexandria Ward feels she has ADHD since childhood. I started her on Strattera, but she isn't sure it is helping and is interested in the controlled medications.  3. Gastroesophageal reflux disease with esophagitis, unspecified whether hemorrhage Alexandria Ward is stable on omeprazole, and she requests a refill today.  4. At risk for dehydration Alexandria Ward is at risk for dehydration due to inadequate water intake.  Assessment/Plan:   1. Vitamin D deficiency Low Vitamin D level contributes to fatigue and are associated with obesity, breast, and colon cancer. We will refill prescription Vitamin D for 1 month. Alexandria Ward will follow-up for routine testing of Vitamin D, at least 2-3 times per year to avoid over-replacement.  - Vitamin D, Ergocalciferol, (DRISDOL) 1.25 MG (50000 UNIT) CAPS capsule; Take 1 capsule (50,000 Units total) by mouth every 7 (seven) days.  Dispense: 4 capsule; Refill: 0  2. Attention deficit hyperactivity disorder (ADHD), other type We will refer to Psychiatry for an evaluation. We will refill Strattera for 1 month, until  Alexandria Ward is able to be evaluated.  - atomoxetine (STRATTERA) 25 MG capsule; Take 1 capsule (25 mg total) by mouth every morning.  Dispense: 30 capsule; Refill: 0 - Ambulatory referral to Psychiatry  3. Gastroesophageal reflux disease with esophagitis, unspecified whether hemorrhage Intensive lifestyle modifications are the first line treatment for this issue. We discussed several lifestyle modifications today. We will refill Omeprazole for 1 month. Alexandria Ward will continue to work on diet, exercise and weight loss efforts. Orders and follow up as documented in patient record.   - omeprazole (PRILOSEC) 40 MG capsule; Take 1 capsule (40 mg total) by mouth daily.  Dispense: 30 capsule; Refill: 0  4. At risk for dehydration Alexandria Ward was given approximately 15 minutes dehydration prevention counseling today. Alexandria Ward is at risk for dehydration due to weight loss and current medication(s). She was encouraged to hydrate and monitor fluid status to avoid dehydration as well as weight loss plateaus.   5. Obesity with the current BMI 34.0 Alexandria Ward is currently in the action stage of change. As such, her goal is to continue with weight loss efforts. She has agreed to the Category 1 Plan.   We discussed various medication options to help Alexandria Ward with her weight loss efforts and we both agreed to decrease Saxenda to 0.3 mg (5 clicks below 0.6 mg).  Behavioral modification strategies: increasing lean protein intake and decreasing simple carbohydrates.  Alexandria Ward has agreed to follow-up with our clinic in 4 weeks. She was informed of the importance of frequent follow-up visits to maximize her success with intensive lifestyle modifications for her multiple health  conditions.   Objective:   Blood pressure 106/70, pulse 80, temperature 98.3 F (36.8 C), height 5\' 1"  (1.549 m), weight 180 lb (81.6 kg), SpO2 98 %. Body mass index is 34.01 kg/m.  General: Cooperative, alert, well developed, in no acute distress. HEENT:  Conjunctivae and lids unremarkable. Cardiovascular: Regular rhythm.  Lungs: Normal work of breathing. Neurologic: No focal deficits.   Lab Results  Component Value Date   CREATININE 0.66 08/13/2020   BUN 14 08/13/2020   NA 144 08/13/2020   K 4.4 08/13/2020   CL 107 (H) 08/13/2020   CO2 23 08/13/2020   Lab Results  Component Value Date   ALT 18 08/13/2020   AST 20 08/13/2020   ALKPHOS 86 08/13/2020   BILITOT 0.3 08/13/2020   Lab Results  Component Value Date   HGBA1C 5.6 08/13/2020   HGBA1C 5.5 04/24/2020   HGBA1C 5.6 09/19/2019   HGBA1C 5.8 (H) 07/14/2019   Lab Results  Component Value Date   INSULIN 6.8 08/13/2020   INSULIN 4.8 09/19/2019   INSULIN 12.3 07/14/2019   Lab Results  Component Value Date   TSH 2.41 04/24/2020   Lab Results  Component Value Date   CHOL 192 08/13/2020   HDL 58 08/13/2020   LDLCALC 122 (H) 08/13/2020   TRIG 62 08/13/2020   Lab Results  Component Value Date   VD25OH 50.5 08/13/2020   VD25OH 42.4 01/18/2020   VD25OH 77.4 09/19/2019   Lab Results  Component Value Date   WBC 6.1 04/24/2020   HGB 13.6 04/24/2020   HCT 42 04/24/2020   MCV 98 (H) 09/19/2019   PLT 297 04/24/2020   No results found for: IRON, TIBC, FERRITIN  Attestation Statements:   Reviewed by clinician on day of visit: allergies, medications, problem list, medical history, surgical history, family history, social history, and previous encounter notes.   I, 13/02/2020, am acting as transcriptionist for Burt Knack, MD.  I have reviewed the above documentation for accuracy and completeness, and I agree with the above. -  Quillian Quince, MD

## 2021-02-02 ENCOUNTER — Other Ambulatory Visit (INDEPENDENT_AMBULATORY_CARE_PROVIDER_SITE_OTHER): Payer: Self-pay | Admitting: Family Medicine

## 2021-02-02 DIAGNOSIS — K2100 Gastro-esophageal reflux disease with esophagitis, without bleeding: Secondary | ICD-10-CM

## 2021-02-02 DIAGNOSIS — F908 Attention-deficit hyperactivity disorder, other type: Secondary | ICD-10-CM

## 2021-02-04 NOTE — Telephone Encounter (Signed)
Pt last seen by Dr. Beasley.  

## 2021-02-05 ENCOUNTER — Other Ambulatory Visit (INDEPENDENT_AMBULATORY_CARE_PROVIDER_SITE_OTHER): Payer: Self-pay | Admitting: Family Medicine

## 2021-02-05 DIAGNOSIS — K2100 Gastro-esophageal reflux disease with esophagitis, without bleeding: Secondary | ICD-10-CM

## 2021-02-05 NOTE — Telephone Encounter (Signed)
Pt last seen by Dr. Beasley.  

## 2021-02-06 ENCOUNTER — Other Ambulatory Visit: Payer: Self-pay

## 2021-02-06 ENCOUNTER — Encounter (INDEPENDENT_AMBULATORY_CARE_PROVIDER_SITE_OTHER): Payer: Self-pay | Admitting: Family Medicine

## 2021-02-06 ENCOUNTER — Ambulatory Visit (INDEPENDENT_AMBULATORY_CARE_PROVIDER_SITE_OTHER): Payer: 59 | Admitting: Family Medicine

## 2021-02-06 VITALS — BP 115/82 | HR 82 | Temp 97.5°F | Ht 61.0 in | Wt 181.0 lb

## 2021-02-06 DIAGNOSIS — K2100 Gastro-esophageal reflux disease with esophagitis, without bleeding: Secondary | ICD-10-CM | POA: Diagnosis not present

## 2021-02-06 DIAGNOSIS — E559 Vitamin D deficiency, unspecified: Secondary | ICD-10-CM | POA: Diagnosis not present

## 2021-02-06 DIAGNOSIS — Z9189 Other specified personal risk factors, not elsewhere classified: Secondary | ICD-10-CM

## 2021-02-06 DIAGNOSIS — Z6838 Body mass index (BMI) 38.0-38.9, adult: Secondary | ICD-10-CM

## 2021-02-06 DIAGNOSIS — F908 Attention-deficit hyperactivity disorder, other type: Secondary | ICD-10-CM | POA: Diagnosis not present

## 2021-02-06 MED ORDER — ATOMOXETINE HCL 25 MG PO CAPS: 25.0000 mg | ORAL_CAPSULE | Freq: Every morning | ORAL | 0 refills | Status: DC

## 2021-02-06 MED ORDER — VITAMIN D (ERGOCALCIFEROL) 1.25 MG (50000 UNIT) PO CAPS: 50000.0000 [IU] | ORAL_CAPSULE | ORAL | 0 refills | Status: DC

## 2021-02-06 MED ORDER — OMEPRAZOLE 40 MG PO CPDR: 40.0000 mg | DELAYED_RELEASE_CAPSULE | Freq: Every day | ORAL | 0 refills | Status: DC

## 2021-02-06 NOTE — Progress Notes (Signed)
Chief Complaint:   OBESITY Alexandria Ward is here to discuss her progress with her obesity treatment plan along with follow-up of her obesity related diagnoses. Alexandria Ward is on the Category 1 Plan and states she is following her eating plan approximately 70% of the time. Alexandria Ward states she is doing 0 minutes 0 times per week.  Today's visit was #: 22 Starting weight: 206 lbs Starting date: 07/14/2019 Today's weight: 181 lbs Today's date: 02/06/2021 Total lbs lost to date: 25 Total lbs lost since last in-office visit: 0  Interim History: Alexandria Ward is taking Saxenda intermittently due to it making her feel bloated, and then she skips doses.  Subjective:   1. Gastroesophageal reflux disease with esophagitis, unspecified whether hemorrhage Alexandria Ward is taking Prilosec, and she requests a refill.  2. Vitamin D deficiency Alexandria Ward is stable on Vit D, and she denies signs of over-replacement.  3. Attention deficit hyperactivity disorder (ADHD), other type Alexandria Ward is stable on Strattera, and she has an appointment with a new Psychiatrist to evaluate if her symptoms are actually due to ADHD or something else.   4. At risk for impaired metabolic function Alexandria Ward is at increased risk for impaired metabolic function due to decreased protein.  Assessment/Plan:   1. Gastroesophageal reflux disease with esophagitis, unspecified whether hemorrhage Intensive lifestyle modifications are the first line treatment for this issue. We discussed several lifestyle modifications today. We will refill Prilosec 40 mg q daily #30 for 1 month. Alexandria Ward will continue to work on diet, exercise and weight loss efforts. Orders and follow up as documented in patient record.   2. Vitamin D deficiency Low Vitamin D level contributes to fatigue and are associated with obesity, breast, and colon cancer. We will refill prescription Vitamin D 50,000 IU every week #4 for 1 month. Alexandria Ward will follow-up for routine testing of Vitamin D, at least 2-3  times per year to avoid over-replacement.  3. Attention deficit hyperactivity disorder (ADHD), other type We will refill Strattera 25 mg q AM #30 for 1 month. Alexandria Ward will continue to follow up as directed.  4. At risk for impaired metabolic function Alexandria Ward was given approximately 30 minutes of impaired  metabolic function prevention counseling today. We discussed intensive lifestyle modifications today with an emphasis on specific nutrition and exercise instructions and strategies.   Repetitive spaced learning was employed today to elicit superior memory formation and behavioral change.  5. Obesity with current BMI 34.3 Alexandria Ward is currently in the action stage of change. As such, her goal is to continue with weight loss efforts. She has agreed to keeping a food journal and adhering to recommended goals of 1000-1200 calories and 25+ grams of protein daily.   Behavioral modification strategies: increasing lean protein intake and meal planning and cooking strategies.  Yatzil has agreed to follow-up with our clinic in 4 weeks. She was informed of the importance of frequent follow-up visits to maximize her success with intensive lifestyle modifications for her multiple health conditions.   Objective:   Blood pressure 115/82, pulse 82, temperature (!) 97.5 F (36.4 C), height 5\' 1"  (1.549 m), weight 181 lb (82.1 kg), SpO2 100 %. Body mass index is 34.2 kg/m.  General: Cooperative, alert, well developed, in no acute distress. HEENT: Conjunctivae and lids unremarkable. Cardiovascular: Regular rhythm.  Lungs: Normal work of breathing. Neurologic: No focal deficits.   Lab Results  Component Value Date   CREATININE 0.66 08/13/2020   BUN 14 08/13/2020   NA 144 08/13/2020   K  4.4 08/13/2020   CL 107 (H) 08/13/2020   CO2 23 08/13/2020   Lab Results  Component Value Date   ALT 18 08/13/2020   AST 20 08/13/2020   ALKPHOS 86 08/13/2020   BILITOT 0.3 08/13/2020   Lab Results  Component Value  Date   HGBA1C 5.6 08/13/2020   HGBA1C 5.5 04/24/2020   HGBA1C 5.6 09/19/2019   HGBA1C 5.8 (H) 07/14/2019   Lab Results  Component Value Date   INSULIN 6.8 08/13/2020   INSULIN 4.8 09/19/2019   INSULIN 12.3 07/14/2019   Lab Results  Component Value Date   TSH 2.41 04/24/2020   Lab Results  Component Value Date   CHOL 192 08/13/2020   HDL 58 08/13/2020   LDLCALC 122 (H) 08/13/2020   TRIG 62 08/13/2020   Lab Results  Component Value Date   VD25OH 50.5 08/13/2020   VD25OH 42.4 01/18/2020   VD25OH 77.4 09/19/2019   Lab Results  Component Value Date   WBC 6.1 04/24/2020   HGB 13.6 04/24/2020   HCT 42 04/24/2020   MCV 98 (H) 09/19/2019   PLT 297 04/24/2020   No results found for: IRON, TIBC, FERRITIN  Attestation Statements:   Reviewed by clinician on day of visit: allergies, medications, problem list, medical history, surgical history, family history, social history, and previous encounter notes.   I, Burt Knack, am acting as transcriptionist for Quillian Quince, MD.  I have reviewed the above documentation for accuracy and completeness, and I agree with the above. -  Quillian Quince, MD

## 2021-02-14 ENCOUNTER — Other Ambulatory Visit: Payer: Self-pay

## 2021-02-14 ENCOUNTER — Encounter: Payer: Self-pay | Admitting: Neurology

## 2021-02-14 ENCOUNTER — Ambulatory Visit: Payer: 59 | Admitting: Neurology

## 2021-02-14 VITALS — BP 109/71 | HR 90 | Ht 61.0 in | Wt 185.0 lb

## 2021-02-14 DIAGNOSIS — G43101 Migraine with aura, not intractable, with status migrainosus: Secondary | ICD-10-CM | POA: Diagnosis not present

## 2021-02-14 DIAGNOSIS — G478 Other sleep disorders: Secondary | ICD-10-CM

## 2021-02-14 MED ORDER — TOPIRAMATE 25 MG PO TABS: 25.0000 mg | ORAL_TABLET | Freq: Two times a day (BID) | ORAL | 3 refills | Status: DC

## 2021-02-14 NOTE — Patient Instructions (Addendum)
Migraine Headache A migraine headache is an intense, throbbing pain on one side or both sides of the head. Migraine headaches may also cause other symptoms, such as nausea, vomiting, and sensitivity to light and noise. A migraine headache can last from 4 hours to 3 days. Talk with your doctor about what things may bring on (trigger) your migraine headaches. What are the causes? The exact cause of this condition is not known. However, a migraine may be caused when nerves in the brain become irritated and release chemicals that cause inflammation of blood vessels. This inflammation causes pain. This condition may be triggered or caused by: Drinking alcohol. Smoking. Taking medicines, such as: Medicine used to treat chest pain (nitroglycerin). Birth control pills. Estrogen. Certain blood pressure medicines. Eating or drinking products that contain nitrates, glutamate, aspartame, or tyramine. Aged cheeses, chocolate, or caffeine may also be triggers. Doing physical activity. Other things that may trigger a migraine headache include: Menstruation. Pregnancy. Hunger. Stress. Lack of sleep or too much sleep. Weather changes. Fatigue. What increases the risk? The following factors may make you more likely to experience migraine headaches: Being a certain age. This condition is more common in people who are 25-55 years old. Being female. Having a family history of migraine headaches. Being Caucasian. Having a mental health condition, such as depression or anxiety. Being obese. What are the signs or symptoms? The main symptom of this condition is pulsating or throbbing pain. This pain may: Happen in any area of the head, such as on one side or both sides. Interfere with daily activities. Get worse with physical activity. Get worse with exposure to bright lights or loud noises. Other symptoms may include: Nausea. Vomiting. Dizziness. General sensitivity to bright lights, loud noises, or  smells. Before you get a migraine headache, you may get warning signs (an aura). An aura may include: Seeing flashing lights or having blind spots. Seeing bright spots, halos, or zigzag lines. Having tunnel vision or blurred vision. Having numbness or a tingling feeling. Having trouble talking. Having muscle weakness. Some people have symptoms after a migraine headache (postdromal phase), such as: Feeling tired. Difficulty concentrating. How is this diagnosed? A migraine headache can be diagnosed based on: Your symptoms. A physical exam. Tests, such as: CT scan or an MRI of the head. These imaging tests can help rule out other causes of headaches. Taking fluid from the spine (lumbar puncture) and analyzing it (cerebrospinal fluid analysis, or CSF analysis). How is this treated? This condition may be treated with medicines that: Relieve pain. Relieve nausea. Prevent migraine headaches. Treatment for this condition may also include: Acupuncture. Lifestyle changes like avoiding foods that trigger migraine headaches. Biofeedback. Cognitive behavioral therapy. Follow these instructions at home: Medicines Take over-the-counter and prescription medicines only as told by your health care provider. Ask your health care provider if the medicine prescribed to you: Requires you to avoid driving or using heavy machinery. Can cause constipation. You may need to take these actions to prevent or treat constipation: Drink enough fluid to keep your urine pale yellow. Take over-the-counter or prescription medicines. Eat foods that are high in fiber, such as beans, whole grains, and fresh fruits and vegetables. Limit foods that are high in fat and processed sugars, such as fried or sweet foods. Lifestyle Do not drink alcohol. Do not use any products that contain nicotine or tobacco, such as cigarettes, e-cigarettes, and chewing tobacco. If you need help quitting, ask your health care  provider. Get at least 8   hours of sleep every night. Find ways to manage stress, such as meditation, deep breathing, or yoga. General instructions   Keep a journal to find out what may trigger your migraine headaches. For example, write down: What you eat and drink. How much sleep you get. Any change to your diet or medicines. If you have a migraine headache: Avoid things that make your symptoms worse, such as bright lights. It may help to lie down in a dark, quiet room. Do not drive or use heavy machinery. Ask your health care provider what activities are safe for you while you are experiencing symptoms. Keep all follow-up visits as told by your health care provider. This is important. Contact a health care provider if: You develop symptoms that are different or more severe than your usual migraine headache symptoms. You have more than 15 headache days in one month. Get help right away if: Your migraine headache becomes severe. Your migraine headache lasts longer than 72 hours. You have a fever. You have a stiff neck. You have vision loss. Your muscles feel weak or like you cannot control them. You start to lose your balance often. You have trouble walking. You faint. You have a seizure. Summary A migraine headache is an intense, throbbing pain on one side or both sides of the head. Migraines may also cause other symptoms, such as nausea, vomiting, and sensitivity to light and noise. This condition may be treated with medicines and lifestyle changes. You may also need to avoid certain things that trigger a migraine headache. Keep a journal to find out what may trigger your migraine headaches. Contact your health care provider if you have more than 15 headache days in a month or you develop symptoms that are different or more severe than your usual migraine headache symptoms. This information is not intended to replace advice given to you by your health care provider. Make sure you  discuss any questions you have with your health care provider. Document Revised: 09/24/2018 Document Reviewed: 07/15/2018 Elsevier Patient Education  2022 [employer] Topiramate Tablets What is this medication? TOPIRAMATE (toe PYRE a mate) prevents and controls seizures in people with epilepsy. It may also be used to prevent migraine headaches. It works by calming overactive nerves in your body. This medicine may be used for other purposes; ask your health care provider or pharmacist if you have questions. COMMON BRAND NAME(S): Topamax, Topiragen What should I tell my care team before I take this medication? They need to know if you have any of these conditions: Bleeding disorder Kidney disease Lung disease Suicidal thoughts, plans or attempt An unusual or allergic reaction to topiramate, other medications, foods, dyes, or preservatives Pregnant or trying to get pregnant Breast-feeding How should I use this medication? Take this medication by mouth with water. Take it as directed on the prescription label at the same time every day. Do not cut, crush or chew this medicine. Swallow the tablets whole. You can take it with or without food. If it upsets your stomach, take it with food. Keep taking it unless your care team tells you to stop. A special MedGuide will be given to you by the pharmacist with each prescription and refill. Be sure to read this information carefully each time. Talk to your care team about the use of this medication in children. While it may be prescribed for children as young as 2 years for selected conditions, precautions do apply. Overdosage: If you think you have taken too much of  this medicine contact a poison control center or emergency room at once. NOTE: This medicine is only for you. Do not share this medicine with others. What if I miss a dose? If you miss a dose, take it as soon as you can unless it is within 6 hours of the next dose. If it is within 6 hours  of the next dose, skip the missed dose. Take the next dose at the normal time. Do not take double or extra doses. What may interact with this medication? Acetazolamide Alcohol Antihistamines for allergy, cough, and cold Aspirin and aspirin-like medications Atropine Birth control pills Certain medications for anxiety or sleep Certain medications for bladder problems like oxybutynin, tolterodine Certain medications for depression like amitriptyline, fluoxetine, sertraline Certain medications for Parkinson's disease like benztropine, trihexyphenidyl Certain medications for seizures like carbamazepine, lamotrigine, phenobarbital, phenytoin, primidone, valproic acid, zonisamide Certain medications for stomach problems like dicyclomine, hyoscyamine Certain medications for travel sickness like scopolamine Certain medications that treat or prevent blood clots like warfarin, enoxaparin, dalteparin, apixaban, dabigatran, and rivaroxaban Digoxin Diltiazem General anesthetics like halothane, isoflurane, methoxyflurane, propofol Glyburide Hydrochlorothiazide Ipratropium Lithium Medications that relax muscles Metformin Narcotic medications for pain NSAIDs, medications for pain and inflammation, like ibuprofen or naproxen Phenothiazines like chlorpromazine, mesoridazine, prochlorperazine, thioridazine Pioglitazone This list may not describe all possible interactions. Give your health care provider a list of all the medicines, herbs, non-prescription drugs, or dietary supplements you use. Also tell them if you smoke, drink alcohol, or use illegal drugs. Some items may interact with your medicine. What should I watch for while using this medication? Visit your care team for regular checks on your progress. Tell your care team if your symptoms do not start to get better or if they get worse. Do not suddenly stop taking this medication. You may develop a severe reaction. Your care team will tell you how  much medication to take. If your care team wants you to stop the medication, the dose may be slowly lowered over time to avoid any side effects. Wear a medical ID bracelet or chain. Carry a card that describes your condition. List the medications and doses you take on the card. You may get drowsy or dizzy. Do not drive, use machinery, or do anything that needs mental alertness until you know how this medication affects you. Do not stand up or sit up quickly, especially if you are an older patient. This reduces the risk of dizzy or fainting spells. Alcohol may interfere with the effects of this medication. Avoid alcoholic drinks. This medication may cause serious skin reactions. They can happen weeks to months after starting the medication. Contact your care team right away if you notice fevers or flu-like symptoms with a rash. The rash may be red or purple and then turn into blisters or peeling of the skin. Or, you might notice a red rash with swelling of the face, lips or lymph nodes in your neck or under your arms. Watch for new or worsening thoughts of suicide or depression. This includes sudden changes in mood, behaviors, or thoughts. These changes can happen at any time but are more common in the beginning of treatment or after a change in dose. Call your care team right away if you experience these thoughts or worsening depression. This medication may slow your child's growth if it is taken for a long time at high doses. Your care team will monitor your child's growth. Using this medication for a long time may weaken your  bones. The risk of bone fractures may be increased. Talk to your care team about your bone health. Do not become pregnant while taking this medication. Hormone forms of birth control may not work as well with this medication. Talk to your care team about other forms of birth control. There is potential for serious harm to an unborn child. Tell your care team right away if you think you  might be pregnant. What side effects may I notice from receiving this medication? Side effects that you should report to your care team as soon as possible: Allergic reactions-skin rash, itching, hives, swelling of the face, lips, tongue, or throat High acid level-trouble breathing, unusual weakness or fatigue, confusion, headache, fast or irregular heartbeat, nausea, vomiting High ammonia level-unusual weakness or fatigue, confusion, loss of appetite, nausea, vomiting, seizures Fever that does not go away, decrease in sweat Kidney stones-blood in the urine, pain or trouble passing urine, pain in the lower back or sides Redness, blistering, peeling or loosening of the skin, including inside the mouth Sudden eye pain or change in vision such as blurry vision, seeing halos around lights, vision loss Thoughts of suicide or self-harm, worsening mood, feelings of depression Side effects that usually do not require medical attention (report to your care team if they continue or are bothersome): Burning or tingling sensation in hands or feet Difficulty with paying attention, memory, or speech Dizziness Drowsiness Fatigue Loss of appetite with weight loss Slow or sluggish movements of the body This list may not describe all possible side effects. Call your doctor for medical advice about side effects. You may report side effects to FDA at 1-800-FDA-1088. Where should I keep my medication? Keep out of the reach of children and pets. Store between 15 and 30 degrees C (59 and 86 degrees F). Protect from moisture. Keep the container tightly closed. Get rid of any unused medication after the expiration date. To get rid of medications that are no longer needed or have expired: Take the medication to a medication take-back program. Check with your pharmacy or law enforcement to find a location. If you cannot return the medication, check the label or package insert to see if the medication should be thrown  out in the garbage or flushed down the toilet. If you are not sure, ask your care team. If it is safe to put it in the trash, empty the medication out of the container. Mix the medication with cat litter, dirt, coffee grounds, or other unwanted substance. Seal the mixture in a bag or container. Put it in the trash. NOTE: This sheet is a summary. It may not cover all possible information. If you have questions about this medicine, talk to your doctor, pharmacist, or health care provider.  2022 Elsevier/Gold Standard (2020-07-24 12:37:42)

## 2021-02-14 NOTE — Progress Notes (Signed)
SLEEP MEDICINE CLINIC    Provider:  Larey Seat, MD  Primary Care Physician:  [address], [place] [address]     Referring Provider: Starlyn Skeans, [place],  [place] [id number]          Chief Complaint according to patient   Patient presents with:     New Patient (Initial Visit)     Presents today for a referral to address migraines. She has a history of migraines and overall for a while was well treated with amitriptyline and abortive therapy was zolmitriptan. She has found that when she has a really bad migraine hit the zolmitriptan is not working like it use to. (In the past she has also tried preventative Wellbutin and Abortive therapy, OTC meds and sumatriptan.) pt describes more recently the HA's occur in clusters and then can go a while without. complaints of feeling tired all the time, their dentist suggested that she consider checking out OSA. she does know that she snores.       HISTORY OF PRESENT ILLNESS:  Alexandria Ward is a 55 year- old Caucasian female patient seen in a RV on 02/14/2021. The patient now presents for migraine , after she tried weight loss assistance with generic wellbutrin- and developed migraines in response. Has had migraines in her twenties.  She has cluster headaches at night. Racing thoughts at night, causing insomnia. ADHD?  Dr Leafy Ro prescribed a medication and referred to attention specialist. Strattera.  She never got used to CPAP , and couldn't sleep well with it. She feels that apnea and migraines can't be closely related.  She slept through phone and alarms when she takes a sleep aid ( Ambien) .   So, we are meeting under the pretext of working on migraines, and not addressing apnea.  Migraine: she has no log- guessed it would be 4-5 a month, weather changes. Sleep deprivation. Caffeine 2 drinks in AM 1 tea at lunch,well hydrated. Takes allergy meds daily.   Can be lasting into another day- status migrainosus once a month. Not changed by [facility], not always working is ZOMIG low BP.   I like to start a preventive.  Start with Topamax, not anything lowering BP.   Plan B) ajovy, emgality.                    I had the pleasure of seeing her in consultation 1/ 12 /2021 and underwent 2 sleep studies. When I first met [name] in January she had a reported history was in 6 months prior to our appointment of vertigo and dizziness she had been seen by vestibular rehab and PT.  She developed ankle edema and swelling and she had reported quite a significant steady weight gain.  We discussed referral to healthy weight and wellness service of [place].  She enrolled and in the meantime has lost 29 pounds! Her insurance company did not permit [place] to do an attended sleep study also she had a history of parasomnia and vivid dreams.  She underwent a home sleep test on 10 August 2019. Mild obstructive sleep apnea was documented with an overall  Apnea- Hypopnea Index/ hour of sleep (AHI) of 9.2/h and strong  REM sleep dependency. The REM sleep AHI was 32.3/h, three times  the overall AHI. There was also supine sleep position dependency.  Herat rate variability was within normal range and  no clinically  significant hypoxemia was noted. Moderately loud snoring was  indicated by RDI.    Unfortunately hospital refused data to reviewed only 6 nights but in the last 3 months.  The user time for those exceptional nights were 3 hours and 41 minutes on average the AutoSet is set between a minimum pressure of 5 and a maximum pressure of 15 was 3 cm EPR and the residual AHI is 1.7 given that she had a mild apnea to begin with this is still a significant reduction in apnea counts.  No central apneas are noted and the air leak is moderate the 95th percentile pressure is only 7.9 cmH2O.  Also she wore a retainer and bite guard. She woke with migraines.   It  may help if we are reducing the upper pressure window for the patient. She feels that it was too much pressure.       I have the pleasure of seeing Alexandria Ward today, a right -handed Caucasian female with a possible sleep disorder.  She  has a past medical history of Anemia, Asthma, Back pain, Constipation, Depression, Dyspnea, Fatigue, Fluid retention, Hyperlipidemia, Hypothyroidism, IBS (irritable bowel syndrome), Joint pain, Lower extremity edema, Migraines, Palpitations, Shortness of breath, Tachycardia, Thyroid disease, Vitamin B 12 deficiency, and Vitamin D deficiency.. She is taking synthroid. But developed tachycardia, ankle edema.and hypotension with Vertigo on BP medication.  She gained 50 pounds , is menopausal. Was diagnosed with hypothyroidism 12 month ago.  She has seen Dr. [name] , who did a (negative) cortisol test.     Sleep relevant medical history:  Family medical /sleep history: maternal Aunt had a CPAP machine, passed away.   Social history:  Patient is working as a Biomedical engineer - IT- and lives in a household alone. Family status is single , without children. Her mother lives across the street.  The patient currently works from home. Pets are present. 2 poodles. Tobacco use never .  ETOH use ; rare , Caffeine intake in form of Coffee( 1 mug in AM ) Soda( none ) Tea ( at lunch ) or energy drinks. Regular exercise- interrupted by Covid.    Sleep habits are as follows: Lunch is with her mom, generally the biggest meal. The patient's dinner time is between 8-8.30 PM. She eats a light dinner. She often falls asleep at her moms watching TV.  Returns to her house- has GERD- takes amitriptyline. She wears a mouth guard. She sis a mouth breather.  The patient goes to bed at 12- PM to 2 AM. She continues to sleep for 3 hours, wakes for 2 bathroom breaks, the first time at 4 AM.   The preferred sleep position is right lateral- , with the support of 2 pillows, the bed is flat- she prefers  a recliner.  pillows. Dreams are reportedly infrequent.  7 AM is the usual rise time. The patient wakes up with several  Alarms  At 6.30-- 6.45 AM .   She reports not feeling refreshed or restored in AM, with symptoms such as dry mouth, morning headaches,  and residual fatigue. Naps are taken  lasting several hours and are more refreshing than nocturnal sleep.    Review of Systems: Out of a complete 14 system review, the patient complains of only the following symptoms, and all other reviewed systems are negative.: 2/ 2021 :  Chief concern according to patient :  Non restorative sleep, weight gain and fatigue. She gained 50 pounds , is  menopausal. Was diagnosed with hypothyroidism 12 month ago.   Fatigue, sleepiness , snoring, fragmented sleep, Insomnia - nocturia,dry mouth.    How likely are you to doze in the following situations: 0 = not likely, 1 = slight chance, 2 = moderate chance, 3 = high chance   Sitting and Reading? Watching Television? Sitting inactive in a public place (theater or meeting)? As a passenger in a car for an hour without a break? Lying down in the afternoon when circumstances permit? Sitting and talking to someone? Sitting quietly after lunch without alcohol? In a car, while stopped for a few minutes in traffic?   Total = 12/ 24 points   FSS endorsed at 52/ 63 points.  GDS  : she loves her job, feels happy, not overworked.  Worried about her mother.   Social History   Socioeconomic History   Marital status: Single    Spouse name: Not on file   Number of children: 0   Years of education: Not on file   Highest education level: Not on file  Occupational History   Occupation: Market researcher  Tobacco Use   Smoking status: Never   Smokeless tobacco: Never  Vaping Use   Vaping Use: Never used  Substance and Sexual Activity   Alcohol use: Yes    Comment: rarely   Drug use: Never   Sexual activity: Not on file  Other Topics Concern   Not on  file  Social History Narrative   Not on file   Social Determinants of Health   Financial Resource Strain: Not on file  Food Insecurity: Not on file  Transportation Needs: Not on file  Physical Activity: Not on file  Stress: Not on file  Social Connections: Not on file    Family History  Problem Relation Age of Onset   Restless legs syndrome Mother    Arthritis Mother    Neuropathy Mother    Hyperlipidemia Mother    Depression Mother    Heart attack Father     Past Medical History:  Diagnosis Date   Anemia    Asthma    Back pain    Constipation    Depression    Dyspnea    Fatigue    Fluid retention    Hyperlipidemia    Hypothyroidism    IBS (irritable bowel syndrome)    Joint pain    Lower extremity edema    Migraines    Palpitations    Shortness of breath    Tachycardia    Thyroid disease    Vitamin B 12 deficiency    Vitamin D deficiency     Past Surgical History:  Procedure Laterality Date   APPENDECTOMY     endometrial ablasion       Current Outpatient Medications on File Prior to Visit  Medication Sig Dispense Refill   amitriptyline (ELAVIL) 50 MG tablet Take 50 mg by mouth at bedtime.   4   atomoxetine (STRATTERA) 25 MG capsule Take 1 capsule (25 mg total) by mouth every morning. 30 capsule 0   Carboxymethylcellulose Sod PF 1 % GEL Place 1 application into both eyes as needed.     fexofenadine (ALLEGRA) 180 MG tablet Take by mouth.     fluconazole (DIFLUCAN) 150 MG tablet Take 1 tablet by mouth as needed.     fluticasone (FLONASE) 50 MCG/ACT nasal spray Place into the nose.     magnesium gluconate (MAGONATE) 500 MG tablet Take 500 mg by mouth daily.  Multiple Vitamins-Minerals (HAIR/SKIN/NAILS/BIOTIN PO) Take 1 tablet by mouth daily.     omeprazole (PRILOSEC) 40 MG capsule Take 1 capsule (40 mg total) by mouth daily. 30 capsule 0   thyroid (ARMOUR) 15 MG tablet Take 15 mg by mouth daily.     UNABLE TO FIND Take 1 tablet by mouth daily. Med  Name: Phytoceramides     Vitamin D, Ergocalciferol, (DRISDOL) 1.25 MG (50000 UNIT) CAPS capsule Take 1 capsule (50,000 Units total) by mouth every 7 (seven) days. 4 capsule 0   zolmitriptan (ZOMIG) 5 MG tablet Take 5 mg by mouth as needed for migraine.     No current facility-administered medications on file prior to visit.    Allergies  Allergen Reactions   Betadine [Povidone Iodine]    Azithromycin Rash   Ciprofloxacin Rash   Penicillins Rash    Cant remember the reaction or the severity of it   Tape Rash    Physical exam:  Today's Vitals   02/14/21 1411  BP: 109/71  Pulse: 90  Weight: 185 lb (83.9 kg)  Height: 5' 1" (1.549 m)   Body mass index is 34.96 kg/m.   Wt Readings from Last 3 Encounters:  02/14/21 185 lb (83.9 kg)  02/06/21 181 lb (82.1 kg)  01/09/21 180 lb (81.6 kg)     Ht Readings from Last 3 Encounters:  02/14/21 5' 1" (1.549 m)  02/06/21 5' 1" (1.549 m)  01/09/21 5' 1" (1.549 m)      General: The patient is awake, alert and appears not in acute distress. The patient is well groomed. Head: Normocephalic, atraumatic. Neck is supple. Mallampati 4,  neck circumference: 16. 25  inches . Nasal airflow patent.  Retrognathia is not  seen.  Dental status: intact  Cardiovascular:  Regular rate and cardiac rhythm by pulse, without distended neck veins. Respiratory: Lungs are clear to auscultation.  Skin:  Evidence of ankle edema, but no rash. Trunk: The patient's posture is erect.   Neurologic exam : The patient is awake and alert, oriented to place and time.   Memory subjective described as intact.  Speech is fluent,  without  dysarthria, dysphonia or aphasia.  Mood and affect are appropriate.   Cranial nerves: no loss of smell or taste reported  Pupils are equal and briskly reactive to light. Funduscopic exam deferred..  Extraocular movements in vertical and horizontal planes were intact and without nystagmus. No Diplopia.  Facial motor strength is  symmetric and tongue and uvula move midline.  Neck ROM : rotation, tilt and flexion extension were normal for age and shoulder shrug was symmetrical.    Motor exam:  Symmetric bulk, tone and ROM.   Normal tone  .   Sensory:  Fine touch, pinprick and vibration were tested  and  normal.  Proprioception tested in the upper extremities was normal.   Coordination: Rapid alternating movements in the fingers/hands were of normal speed.  The Finger-to-nose maneuver was intact without evidence of ataxia, dysmetria or tremor.   Gait and station: Patient could rise unassisted from a seated position, walked without assistive device.  Toe and heel walk were deferred.  Deep tendon reflexes: in the upper and lower extremities are symmetrically attenuated and intact.  Babinski response was deferred .      After spending a total time of 20 minutes face to face including additional time for physical and neurologic examination, review of laboratory studies,  personal review of imaging studies, reports and results of other testing  and review of referral information / records as far as provided in visit, I have established the following assessments:    1) some headaches 12 days a month, migraine 5 a month, auras not always present, photophobia.   Cold pack help a little.   My Plan is to proceed with: I like to start a preventive.    Start with Topamax, not anything lowering BP.  25 mg once a t night , journal for headaches needed.   Plan B) ajovy, emgality.     I would like to thank Adria Dill Leonia Reader, FNP , and Dr. Dennard Nip, MD,   [address] [place],  [place] [address] for allowing me to meet with and to take care of this pleasant patient.     I plan to follow up either personally or through our NP within 3 month.    Electronically signed by: Larey Seat, MD 02/14/2021 2:35 PM  [facility] Board certified by The [employer] of  Sleep Medicine and Diplomate of the [employer] of Sleep Medicine. Board certified In Neurology through the [employer], Fellow of the [employer] of Neurology. Medical Director of [facility].

## 2021-02-20 ENCOUNTER — Ambulatory Visit (INDEPENDENT_AMBULATORY_CARE_PROVIDER_SITE_OTHER): Payer: 59 | Admitting: Psychology

## 2021-02-20 ENCOUNTER — Encounter (INDEPENDENT_AMBULATORY_CARE_PROVIDER_SITE_OTHER): Payer: Self-pay | Admitting: Family Medicine

## 2021-02-20 DIAGNOSIS — F89 Unspecified disorder of psychological development: Secondary | ICD-10-CM | POA: Diagnosis not present

## 2021-02-20 NOTE — Telephone Encounter (Signed)
Pt last seen by Dr. Beasley.  

## 2021-02-21 NOTE — Telephone Encounter (Signed)
Dr.Beasley 

## 2021-02-26 ENCOUNTER — Encounter (INDEPENDENT_AMBULATORY_CARE_PROVIDER_SITE_OTHER): Payer: Self-pay | Admitting: Family Medicine

## 2021-03-05 ENCOUNTER — Other Ambulatory Visit (INDEPENDENT_AMBULATORY_CARE_PROVIDER_SITE_OTHER): Payer: Self-pay | Admitting: Family Medicine

## 2021-03-05 DIAGNOSIS — K2100 Gastro-esophageal reflux disease with esophagitis, without bleeding: Secondary | ICD-10-CM

## 2021-03-06 ENCOUNTER — Telehealth: Payer: Self-pay | Admitting: Neurology

## 2021-03-06 ENCOUNTER — Encounter: Payer: Self-pay | Admitting: Neurology

## 2021-03-06 ENCOUNTER — Ambulatory Visit (INDEPENDENT_AMBULATORY_CARE_PROVIDER_SITE_OTHER): Payer: 59 | Admitting: Family Medicine

## 2021-03-06 ENCOUNTER — Other Ambulatory Visit: Payer: Self-pay

## 2021-03-06 ENCOUNTER — Encounter (INDEPENDENT_AMBULATORY_CARE_PROVIDER_SITE_OTHER): Payer: Self-pay | Admitting: Family Medicine

## 2021-03-06 VITALS — BP 100/66 | HR 85 | Temp 98.0°F | Ht 61.0 in | Wt 181.0 lb

## 2021-03-06 DIAGNOSIS — K2100 Gastro-esophageal reflux disease with esophagitis, without bleeding: Secondary | ICD-10-CM | POA: Diagnosis not present

## 2021-03-06 DIAGNOSIS — E559 Vitamin D deficiency, unspecified: Secondary | ICD-10-CM

## 2021-03-06 DIAGNOSIS — F908 Attention-deficit hyperactivity disorder, other type: Secondary | ICD-10-CM

## 2021-03-06 DIAGNOSIS — Z6838 Body mass index (BMI) 38.0-38.9, adult: Secondary | ICD-10-CM

## 2021-03-06 DIAGNOSIS — Z9189 Other specified personal risk factors, not elsewhere classified: Secondary | ICD-10-CM

## 2021-03-06 DIAGNOSIS — G43101 Migraine with aura, not intractable, with status migrainosus: Secondary | ICD-10-CM

## 2021-03-06 MED ORDER — VITAMIN D (ERGOCALCIFEROL) 1.25 MG (50000 UNIT) PO CAPS: 50000.0000 [IU] | ORAL_CAPSULE | ORAL | 0 refills | Status: DC

## 2021-03-06 MED ORDER — ATOMOXETINE HCL 25 MG PO CAPS: 25.0000 mg | ORAL_CAPSULE | Freq: Every morning | ORAL | 0 refills | Status: DC

## 2021-03-06 MED ORDER — SUMATRIPTAN SUCCINATE 6 MG/0.5ML ~~LOC~~ SOLN
6.0000 mg | SUBCUTANEOUS | 3 refills | Status: DC | PRN
Start: 2021-03-06 — End: 2022-06-03

## 2021-03-06 MED ORDER — TIZANIDINE HCL 4 MG PO TABS: 4.0000 mg | ORAL_TABLET | Freq: Four times a day (QID) | ORAL | 1 refills | Status: DC | PRN

## 2021-03-06 MED ORDER — OMEPRAZOLE 40 MG PO CPDR: 40.0000 mg | DELAYED_RELEASE_CAPSULE | Freq: Every day | ORAL | 0 refills | Status: DC

## 2021-03-06 MED ORDER — ONDANSETRON 4 MG PO TBDP
4.0000 mg | ORAL_TABLET | Freq: Three times a day (TID) | ORAL | 1 refills | Status: DC | PRN
Start: 2021-03-06 — End: 2022-06-03

## 2021-03-06 MED ORDER — AJOVY 225 MG/1.5ML ~~LOC~~ SOAJ: 225.0000 mg | SUBCUTANEOUS | 11 refills | Status: DC

## 2021-03-06 NOTE — Telephone Encounter (Signed)
Called the pt and reviewed the plan as instructed by Dr Terrace Arabia. Informed her that the medications have been sent in and most likely the ajovy will require an authorization. Informed that we would start that. Encouraged the patient to let us know if trying this combination of medication tonight doesn't help and we can discuss alternative options. Advised I would send the directions as a reminder in a mychart message and instructed her to call with any problems. She was appreciative for the assistance.

## 2021-03-06 NOTE — Telephone Encounter (Signed)
Called the patient because of her mychart message.  Pt has been constantly struggling with having migraine for 8 days straight. At previous visit she was started on topiramate 25 mg BID and felt that the headaches/migraines worsened under this medication.  In the past she has also tried/failed amitriptyline,welbutrin, magnesium  Her BP runs on lower side 100's/60's and beta blockers didn't see like it would be a good option to use.  She is having to use the Zomig daily because the migraine is not breaking. 8 days is the longest it has lasted.  Advised that I would discuss with our work in MD to see if she has recommendation of changing to another preventative medication and if maybe doing a steroid dose pack would be beneficial to break the cycle.  Will reach out to the MD with her recommendation after discussing with her.

## 2021-03-06 NOTE — Progress Notes (Signed)
Chief Complaint:   OBESITY Alexandria Ward is here to discuss her progress with her obesity treatment plan along with follow-up of her obesity related diagnoses. Alexandria Ward is on keeping a food journal and adhering to recommended goals of 1000-1200 calories and 75+ grams of protein daily and states she is following her eating plan approximately 70% of the time. [name] states she is doing 0 minutes 0 times per week.  Today's visit was #: 23 Starting weight: 206 lbs Starting date: 07/14/2019 Today's weight: 181 lbs Today's date: 03/06/2021 Total lbs lost to date: 25 Total lbs lost since last in-office visit: 4  Interim History: Alexandria Ward has done better with weight loss since her last visit. She is doing better with journaling and meeting her calorie and protein goals.  Subjective:   1. Vitamin D deficiency [name] is stable on Vit D, and she is due for labs soon.  2. Gastroesophageal reflux disease with esophagitis, unspecified whether hemorrhage [name] is stable on Prilosec and she has questions about digestive enzymes.  3. Attention deficit hyperactivity disorder (ADHD), other type Alexandria Ward is stable on Strattera. Her blood pressure is controlled and she requests a refill today.  4. At risk for activity intolerance Alexandria Ward is at risk for exercise intolerance with not exercising.  Assessment/Plan:   1. Vitamin D deficiency Low Vitamin D level contributes to fatigue and are associated with obesity, breast, and colon cancer. We will refill prescription Vitamin D for 1 month, and we will recheck labs in 1 month. Naeemah will follow-up for routine testing of Vitamin D, at least 2-3 times per year to avoid over-replacement.  - Vitamin D, Ergocalciferol, (DRISDOL) 1.25 MG (50000 UNIT) CAPS capsule; Take 1 capsule (50,000 Units total) by mouth every 7 (seven) days.  Dispense: 4 capsule; Refill: 0  2. Gastroesophageal reflux disease with esophagitis, unspecified whether hemorrhage Intensive lifestyle  modifications are the first line treatment for this issue. We discussed several lifestyle modifications today. We will refill Prilosec for 1 month. [name] is to space enzymes at least 2 hours apart for Prilosec. She will continue to work on diet, exercise and weight loss efforts. Orders and follow up as documented in patient record.   - omeprazole (PRILOSEC) 40 MG capsule; Take 1 capsule (40 mg total) by mouth daily.  Dispense: 30 capsule; Refill: 0  3. Attention deficit hyperactivity disorder (ADHD), other type [name] will continue Strattera and we will refill for 1 month.  - atomoxetine (STRATTERA) 25 MG capsule; Take 1 capsule (25 mg total) by mouth every morning.  Dispense: 30 capsule; Refill: 0  4. At risk for activity intolerance Alexandria Ward was given approximately 15 minutes of exercise intolerance counseling today. She is 55 y.o. female and has risk factors exercise intolerance including obesity. We discussed intensive lifestyle modifications today with an emphasis on specific weight loss instructions and strategies. Alexandria Ward will start exercising this Fall.  Repetitive spaced learning was employed today to elicit superior memory formation and behavioral change.  5. Obesity with current BMI 34.4 Alexandria Ward is currently in the action stage of change. As such, her goal is to continue with weight loss efforts. She has agreed to keeping a food journal and adhering to recommended goals of 1000-1200 calories and 75+ grams of protein daily.   Exercise goals: As is.  Behavioral modification strategies: increasing lean protein intake.  Alexandria Ward has agreed to follow-up with our clinic in 3 to 4 weeks. She was informed of the importance of frequent follow-up visits to maximize her  success with intensive lifestyle modifications for her multiple health conditions.   Objective:   Blood pressure 100/66, pulse 85, temperature 98 F (36.7 C), height 5\' 1"  (1.549 m), weight 181 lb (82.1 kg), SpO2 99 %. Body mass index  is 34.2 kg/m.  General: Cooperative, alert, well developed, in no acute distress. HEENT: Conjunctivae and lids unremarkable. Cardiovascular: Regular rhythm.  Lungs: Normal work of breathing. Neurologic: No focal deficits.   Lab Results  Component Value Date   CREATININE 0.66 08/13/2020   BUN 14 08/13/2020   NA 144 08/13/2020   K 4.4 08/13/2020   CL 107 (H) 08/13/2020   CO2 23 08/13/2020   Lab Results  Component Value Date   ALT 18 08/13/2020   AST 20 08/13/2020   ALKPHOS 86 08/13/2020   BILITOT 0.3 08/13/2020   Lab Results  Component Value Date   HGBA1C 5.6 08/13/2020   HGBA1C 5.5 04/24/2020   HGBA1C 5.6 09/19/2019   HGBA1C 5.8 (H) 07/14/2019   Lab Results  Component Value Date   INSULIN 6.8 08/13/2020   INSULIN 4.8 09/19/2019   INSULIN 12.3 07/14/2019   Lab Results  Component Value Date   TSH 2.41 04/24/2020   Lab Results  Component Value Date   CHOL 192 08/13/2020   HDL 58 08/13/2020   LDLCALC 122 (H) 08/13/2020   TRIG 62 08/13/2020   Lab Results  Component Value Date   VD25OH 50.5 08/13/2020   VD25OH 42.4 01/18/2020   VD25OH 77.4 09/19/2019   Lab Results  Component Value Date   WBC 6.1 04/24/2020   HGB 13.6 04/24/2020   HCT 42 04/24/2020   MCV 98 (H) 09/19/2019   PLT 297 04/24/2020   No results found for: IRON, TIBC, FERRITIN  Attestation Statements:   Reviewed by clinician on day of visit: allergies, medications, problem list, medical history, surgical history, family history, social history, and previous encounter notes.   I, 13/02/2020, am acting as transcriptionist for Alexandria Knack, MD.  I have reviewed the above documentation for accuracy and completeness, and I agree with the above. -  Quillian Quince, MD

## 2021-03-06 NOTE — Telephone Encounter (Signed)
Chart reviewed, patient has hypothyroidism, migraine headache, obstructive sleep apnea  Is already on triptan treatment Zomig as needed for anxiety treatment, reported 8 days history of headache,  I have called in Imitrex subcutaneous injection, Zofran 4 mg, tizanidine 4 mg, Aleve 1 to 2 tablets, even Benadryl if needed 25 mg 1 to 2 tablets as acute rescue therapy for nighttime night,  If she still has significant headache after overnight sleep, with above-mentioned cocktail, may call back office early tomorrow morning, I can work her in for nerve block,  Start Ajovy monthly as preventive medications  Meds ordered this encounter  Medications   SUMAtriptan (IMITREX) 6 MG/0.5ML SOLN injection    Sig: Inject 0.5 mLs (6 mg total) into the skin every 2 (two) hours as needed for migraine or headache. May repeat in 2 hours if headache persists or recurs.    Dispense:  1 mL    Refill:  3   ondansetron (ZOFRAN ODT) 4 MG disintegrating tablet    Sig: Take 1 tablet (4 mg total) by mouth every 8 (eight) hours as needed.    Dispense:  20 tablet    Refill:  1   tiZANidine (ZANAFLEX) 4 MG tablet    Sig: Take 1 tablet (4 mg total) by mouth every 6 (six) hours as needed.    Dispense:  30 tablet    Refill:  1   Fremanezumab-vfrm (AJOVY) 225 MG/1.5ML SOAJ    Sig: Inject 225 mg into the skin every 30 (thirty) days.    Dispense:  1.68 mL    Refill:  11

## 2021-03-07 ENCOUNTER — Telehealth: Payer: 59 | Admitting: Neurology

## 2021-03-07 NOTE — Telephone Encounter (Signed)
Attempted a PA for Ajovy on [facility] [id number] Response received: "Please advise the dispensing pharmacy to contact the Pharmacy Help Line at [phone] for assistance."

## 2021-03-08 ENCOUNTER — Ambulatory Visit: Payer: 59 | Admitting: Psychology

## 2021-03-11 ENCOUNTER — Other Ambulatory Visit: Payer: Self-pay | Admitting: Neurology

## 2021-03-11 ENCOUNTER — Other Ambulatory Visit: Payer: Self-pay

## 2021-03-11 ENCOUNTER — Ambulatory Visit (INDEPENDENT_AMBULATORY_CARE_PROVIDER_SITE_OTHER): Payer: Self-pay | Admitting: Neurology

## 2021-03-11 DIAGNOSIS — G43001 Migraine without aura, not intractable, with status migrainosus: Secondary | ICD-10-CM

## 2021-03-11 MED ORDER — AJOVY 225 MG/1.5ML ~~LOC~~ SOAJ: 1.5000 mL | Freq: Once | SUBCUTANEOUS | 0 refills | Status: AC

## 2021-03-11 NOTE — Patient Instructions (Signed)
Pt came in today for education on how to take Ajovy. She brought her medication from home and was also provided a sample of the medication

## 2021-03-12 ENCOUNTER — Ambulatory Visit: Payer: 59 | Admitting: Psychology

## 2021-03-21 ENCOUNTER — Ambulatory Visit (INDEPENDENT_AMBULATORY_CARE_PROVIDER_SITE_OTHER): Payer: 59 | Admitting: Psychology

## 2021-03-21 DIAGNOSIS — F900 Attention-deficit hyperactivity disorder, predominantly inattentive type: Secondary | ICD-10-CM

## 2021-03-31 ENCOUNTER — Other Ambulatory Visit (INDEPENDENT_AMBULATORY_CARE_PROVIDER_SITE_OTHER): Payer: Self-pay | Admitting: Family Medicine

## 2021-03-31 DIAGNOSIS — K2100 Gastro-esophageal reflux disease with esophagitis, without bleeding: Secondary | ICD-10-CM

## 2021-03-31 DIAGNOSIS — F908 Attention-deficit hyperactivity disorder, other type: Secondary | ICD-10-CM

## 2021-04-03 ENCOUNTER — Other Ambulatory Visit: Payer: Self-pay

## 2021-04-03 ENCOUNTER — Encounter (INDEPENDENT_AMBULATORY_CARE_PROVIDER_SITE_OTHER): Payer: Self-pay | Admitting: Family Medicine

## 2021-04-03 ENCOUNTER — Ambulatory Visit (INDEPENDENT_AMBULATORY_CARE_PROVIDER_SITE_OTHER): Payer: 59 | Admitting: Family Medicine

## 2021-04-03 VITALS — BP 101/68 | HR 80 | Temp 98.1°F | Ht 61.0 in | Wt 179.0 lb

## 2021-04-03 DIAGNOSIS — E559 Vitamin D deficiency, unspecified: Secondary | ICD-10-CM | POA: Diagnosis not present

## 2021-04-03 DIAGNOSIS — K2100 Gastro-esophageal reflux disease with esophagitis, without bleeding: Secondary | ICD-10-CM | POA: Diagnosis not present

## 2021-04-03 DIAGNOSIS — Z6838 Body mass index (BMI) 38.0-38.9, adult: Secondary | ICD-10-CM

## 2021-04-03 DIAGNOSIS — E7849 Other hyperlipidemia: Secondary | ICD-10-CM

## 2021-04-03 DIAGNOSIS — F908 Attention-deficit hyperactivity disorder, other type: Secondary | ICD-10-CM

## 2021-04-03 DIAGNOSIS — Z9189 Other specified personal risk factors, not elsewhere classified: Secondary | ICD-10-CM

## 2021-04-03 MED ORDER — OMEPRAZOLE 40 MG PO CPDR: 40.0000 mg | DELAYED_RELEASE_CAPSULE | Freq: Every day | ORAL | 0 refills | Status: DC

## 2021-04-03 MED ORDER — ATOMOXETINE HCL 25 MG PO CAPS: 25.0000 mg | ORAL_CAPSULE | Freq: Every morning | ORAL | 0 refills | Status: DC

## 2021-04-03 MED ORDER — VITAMIN D (ERGOCALCIFEROL) 1.25 MG (50000 UNIT) PO CAPS: 50000.0000 [IU] | ORAL_CAPSULE | ORAL | 0 refills | Status: DC

## 2021-04-03 NOTE — Progress Notes (Signed)
Chief Complaint:   OBESITY Alexandria Ward is here to discuss her progress with her obesity treatment plan along with follow-up of her obesity related diagnoses. [name] is on keeping a food journal and adhering to recommended goals of 1000-1200 calories and 75+ grams of protein daily and states she is following her eating plan approximately 70% of the time. [name] states she is doing 0 minutes 0 times per week.  Today's visit was #: 24 Starting weight: 206 lbs Starting date: 07/14/2019 Today's weight: 179 lbs Today's date: 04/03/2021 Total lbs lost to date: 27 Total lbs lost since last in-office visit: 2  Interim History: Alexandria Ward continues to do well with weight loss. She notes her hunger is mostly controlled and she is feeling more satisfied.  Subjective:   1. Hyperlipidemia, pure [name] is working on decreasing cholesterol in her diet and weight loss. Last LDL was elevated.  2. Vitamin D deficiency [name] is on Vit D, and she is due to have labs rechecked.  3. Gastroesophageal reflux disease with esophagitis, unspecified whether hemorrhage [name] is stable on Protonix, and she requests a refill.  4. Attention deficit hyperactivity disorder (ADHD), other type [name] saw [name] and she was diagnosed with ADHD. She is scheduling an appointment with Psychiatry for more treatment options.  5. At risk for heart disease [name] is at a higher than average risk for cardiovascular disease due to obesity.   Assessment/Plan:   1. Hyperlipidemia, pure Cardiovascular risk and specific lipid/LDL goals reviewed.  We discussed several lifestyle modifications today. We will check labs today. [name] will continue to work on diet, exercise and weight loss efforts. Orders and follow up as documented in patient record.   - CMP14+EGFR - Lipid Panel With LDL/HDL Ratio - Insulin, random - Hemoglobin A1c  2. Vitamin D deficiency Low Vitamin D level contributes to fatigue and are associated with obesity,  breast, and colon cancer. We will check labs today, and we will refill prescription Vitamin D for 1 month. [name] will follow-up for routine testing of Vitamin D, at least 2-3 times per year to avoid over-replacement.  - Vitamin D, Ergocalciferol, (DRISDOL) 1.25 MG (50000 UNIT) CAPS capsule; Take 1 capsule (50,000 Units total) by mouth every 7 (seven) days.  Dispense: 4 capsule; Refill: 0 - VITAMIN D 25 Hydroxy (Vit-D Deficiency, Fractures)  3. Gastroesophageal reflux disease with esophagitis, unspecified whether hemorrhage Intensive lifestyle modifications are the first line treatment for this issue. We discussed several lifestyle modifications today. We will refill Protonix for 1 month. Alexandria Ward will continue to work on diet, exercise and weight loss efforts. Orders and follow up as documented in patient record.   - omeprazole (PRILOSEC) 40 MG capsule; Take 1 capsule (40 mg total) by mouth daily.  Dispense: 30 capsule; Refill: 0  4. Attention deficit hyperactivity disorder (ADHD), other type We will refill Strattera for 1 month for now, and will turn over treatment to her Psychiatrist when she establishes care.  - atomoxetine (STRATTERA) 25 MG capsule; Take 1 capsule (25 mg total) by mouth every morning.  Dispense: 30 capsule; Refill: 0  5. At risk for heart disease Alexandria Ward was given approximately 15 minutes of coronary artery disease prevention counseling today. She is 55 y.o. female and has risk factors for heart disease including obesity. We discussed intensive lifestyle modifications today with an emphasis on specific weight loss instructions and strategies.   Repetitive spaced learning was employed today to elicit superior memory formation and behavioral change.  6. Obesity  with current BMI 34.0 Alexandria Ward is currently in the action stage of change. As such, her goal is to continue with weight loss efforts. She has agreed to keeping a food journal and adhering to recommended goals of 1000-1200  calories and 75+ grams of protein daily.   Behavioral modification strategies: increasing lean protein intake.  [name] has agreed to follow-up with our clinic in 4 weeks. She was informed of the importance of frequent follow-up visits to maximize her success with intensive lifestyle modifications for her multiple health conditions.   [name] was informed we would discuss her lab results at her next visit unless there is a critical issue that needs to be addressed sooner. [name] agreed to keep her next visit at the agreed upon time to discuss these results.  Objective:   Blood pressure 101/68, pulse 80, temperature 98.1 F (36.7 C), height _0  (1.549 m), weight 179 lb (81.2 kg), SpO2 97 %. Body mass index is 33.82 kg/m.  General: Cooperative, alert, well developed, in no acute distress. HEENT: Conjunctivae and lids unremarkable. Cardiovascular: Regular rhythm.  Lungs: Normal work of breathing. Neurologic: No focal deficits.   Lab Results  Component Value Date   CREATININE 0.66 08/13/2020   BUN 14 08/13/2020   NA 144 08/13/2020   K 4.4 08/13/2020   CL 107 (H) 08/13/2020   CO2 23 08/13/2020   Lab Results  Component Value Date   ALT 18 08/13/2020   AST 20 08/13/2020   ALKPHOS 86 08/13/2020   BILITOT 0.3 08/13/2020   Lab Results  Component Value Date   HGBA1C 5.6 08/13/2020   HGBA1C 5.5 04/24/2020   HGBA1C 5.6 09/19/2019   HGBA1C 5.8 (H) 07/14/2019   Lab Results  Component Value Date   INSULIN 6.8 08/13/2020   INSULIN 4.8 09/19/2019   INSULIN 12.3 07/14/2019   Lab Results  Component Value Date   TSH 2.41 04/24/2020   Lab Results  Component Value Date   CHOL 192 08/13/2020   HDL 58 08/13/2020   LDLCALC 122 (H) 08/13/2020   TRIG 62 08/13/2020   Lab Results  Component Value Date   VD25OH 50.5 08/13/2020   VD25OH 42.4 01/18/2020   VD25OH 77.4 09/19/2019   Lab Results  Component Value Date   WBC 6.1 04/24/2020   HGB 13.6 04/24/2020   HCT 42 04/24/2020   MCV  98 (H) 09/19/2019   PLT 297 04/24/2020   No results found for: IRON, TIBC, FERRITIN  Attestation Statements:   Reviewed by clinician on day of visit: allergies, medications, problem list, medical history, surgical history, family history, social history, and previous encounter notes.   I, Trixie Dredge, am acting as transcriptionist for Dennard Nip, MD.  I have reviewed the above documentation for accuracy and completeness, and I agree with the above. -  Dennard Nip, MD

## 2021-04-04 LAB — CMP14+EGFR
ALT: 16 IU/L (ref 0–32)
AST: 16 IU/L (ref 0–40)
Albumin/Globulin Ratio: 2.5 — ABNORMAL HIGH (ref 1.2–2.2)
Albumin: 4.3 g/dL (ref 3.8–4.9)
Alkaline Phosphatase: 87 IU/L (ref 44–121)
BUN/Creatinine Ratio: 22 (ref 9–23)
BUN: 14 mg/dL (ref 6–24)
Bilirubin Total: 0.2 mg/dL (ref 0.0–1.2)
CO2: 21 mmol/L (ref 20–29)
Calcium: 10 mg/dL (ref 8.7–10.2)
Chloride: 106 mmol/L (ref 96–106)
Creatinine, Ser: 0.64 mg/dL (ref 0.57–1.00)
Globulin, Total: 1.7 g/dL (ref 1.5–4.5)
Glucose: 85 mg/dL (ref 70–99)
Potassium: 4.6 mmol/L (ref 3.5–5.2)
Sodium: 143 mmol/L (ref 134–144)
Total Protein: 6 g/dL (ref 6.0–8.5)
eGFR: 104 mL/min/{1.73_m2} (ref >59)

## 2021-04-04 LAB — HEMOGLOBIN A1C
Est. average glucose Bld gHb Est-mCnc: 108 mg/dL
Hgb A1c MFr Bld: 5.4 % (ref 4.8–5.6)

## 2021-04-04 LAB — LIPID PANEL WITH LDL/HDL RATIO
Cholesterol, Total: 187 mg/dL (ref 100–199)
HDL: 54 mg/dL (ref >39)
LDL Chol Calc (NIH): 121 mg/dL — ABNORMAL HIGH (ref 0–99)
LDL/HDL Ratio: 2.2 ratio (ref 0.0–3.2)
Triglycerides: 63 mg/dL (ref 0–149)
VLDL Cholesterol Cal: 12 mg/dL (ref 5–40)

## 2021-04-04 LAB — VITAMIN D 25 HYDROXY (VIT D DEFICIENCY, FRACTURES): Vit D, 25-Hydroxy: 57.7 ng/mL (ref 30.0–100.0)

## 2021-04-04 LAB — INSULIN, RANDOM: INSULIN: 7.1 u[IU]/mL (ref 2.6–24.9)

## 2021-04-24 ENCOUNTER — Ambulatory Visit: Payer: 59 | Admitting: Neurology

## 2021-04-24 ENCOUNTER — Encounter: Payer: Self-pay | Admitting: Neurology

## 2021-04-24 VITALS — BP 114/72 | HR 85 | Ht 61.0 in | Wt 188.0 lb

## 2021-04-24 DIAGNOSIS — G478 Other sleep disorders: Secondary | ICD-10-CM | POA: Diagnosis not present

## 2021-04-24 DIAGNOSIS — G43001 Migraine without aura, not intractable, with status migrainosus: Secondary | ICD-10-CM

## 2021-04-24 DIAGNOSIS — Z87898 Personal history of other specified conditions: Secondary | ICD-10-CM

## 2021-04-24 DIAGNOSIS — F901 Attention-deficit hyperactivity disorder, predominantly hyperactive type: Secondary | ICD-10-CM | POA: Insufficient documentation

## 2021-04-24 MED ORDER — AJOVY 225 MG/1.5ML ~~LOC~~ SOAJ: 225.0000 mg | SUBCUTANEOUS | 11 refills | Status: DC

## 2021-04-24 MED ORDER — ZOLMITRIPTAN 5 MG PO TABS: 5.0000 mg | ORAL_TABLET | ORAL | 1 refills | Status: DC | PRN

## 2021-04-24 NOTE — Progress Notes (Addendum)
SLEEP MEDICINE CLINIC    Provider:  Larey Seat, MD  Primary Care Physician:  [facility], [address] [place] [address]     Referring Provider: Redgie Grayer, MD         Chief Complaint according to patient   Patient presents with:     New Patient (Initial Visit)     Presents today for a referral to address migraines. SHere for 3 month f/u. Pt switched from topiramate to Ajovy.  Pt has tolerated well. Pt is off all ADHD medications and has noticed improvements. Has had no migraines or HA since 10/28. Marland Kitchen complaints of feeling tired all the time, their dentist suggested that she consider checking out OSA. she does know that she snores.       HISTORY 04-24-2021:  Alexandria Ward is a 55 year- old Caucasian female patient seen in a RV on 04/24/2021. She had been on atomoxetine , Strattera,  for compulsive eating.  Alexandria Ward  reports that she was finally tested for adult attention deficit disorder and was diagnosed with it.  She had headaches with all of these medications:  She started several stimulant medications at atypical stimulant medications.  She started on 10-25 with her first stimulant medication Vyvanse but was switched on 10.28 22 to methylphenidate.  Both medications triggered headaches so she could not tolerate it either.  Now off all ADHD medications, she has been headache free since 04-12-21. Sleep hygiene and sleep habits not changed, watching videos at night. Melatonin can be tried.  HST repeat to see if weight loss has reduced the AHI since 06-2019.   Sadly, she has not had success  with ADHD treatment,  Headaches were treated with Topiramate on 9-19, and on 9-26 we added Ajovy.  She had her second dose in October, and she sees a reduction in headache frequency and intensity.   She is overall  talkative, her leg edema is reduced, her sleepiness is improved. She lost 30 pounds since last year, with ups and downs.     Last visit  : 02-14-2021 The patient now presents for migraine , after she tried weight loss assistance with generic wellbutrin- and developed migraines in response. Has had migraines in her twenties.  She has cluster headaches at night. Racing thoughts at night, causing insomnia. ADHD?  Dr Leafy Ro prescribed a medication and referred to attention specialist. Strattera.  She never got used to CPAP , and couldn't sleep well with it. She feels that apnea and migraines can't be closely related.  She slept through phone and alarms when she takes a sleep aid ( Ambien) .   So, we are meeting under the pretext of working on migraines, and not addressing apnea.  Migraine: she has no log- guessed it would be 4-5 a month, weather changes. Sleep deprivation. Caffeine 2 drinks in AM 1 tea at lunch,well hydrated. Takes allergy meds daily.  Can be lasting into another day- status migrainosus once a month. Not changed by [facility], not always working is ZOMIG low BP.   I like to start a preventive.  Start with Topamax, not anything lowering BP.   Plan B) ajovy, emgality.   I had the pleasure of seeing her in consultation 1/ 12 /2021 and underwent 2 sleep studies since, diagnosed with mild OSA, REM dependent. When I first met Alexandria Ward in January she had a reported history was in 6 months prior to our appointment of vertigo  and dizziness she had been seen by vestibular rehab and PT.  She developed ankle edema and swelling and she had reported quite a significant steady weight gain.   We discussed referral to healthy weight and wellness service of [place].  She enrolled and in the meantime has lost 29 pounds! Her insurance company did not permit [place] to do an attended sleep study also she had a history of parasomnia and vivid dreams.  She underwent a home sleep test on 10 August 2019. Mild obstructive sleep apnea was documented with an overall  Apnea- Hypopnea Index/ hour of sleep (AHI) of 9.2/h and strong  REM sleep  dependency. The REM sleep AHI was 32.3/h, three times the overall AHI. There was also supine sleep position dependency.  Heart rate variability was within normal range and no clinically significant hypoxemia was noted. Moderately loud snoring was indicated by RDI.   Compliance : The user time for those exceptional nights were 3 hours and 41 minutes on average the AutoSet is set between a minimum pressure of 5 and a maximum pressure of 15 was 3 cm EPR and the residual AHI is 1.7 given that she had a mild apnea to begin with this is still a significant reduction in apnea counts.  No central apneas are noted and the air leak is moderate the 95th percentile pressure is only 7.9 cmH2O.  Also she wore a retainer and bite guard. She woke with migraines.  It may help if we are reducing the upper pressure window for the patient. She feels that it was too much pressure.       I have the pleasure of seeing Alexandria Ward today, a right -handed Caucasian female with a possible sleep disorder.  She  has a past medical history of Anemia, Asthma, Back pain, Constipation, Depression, Dyspnea, Fatigue, Fluid retention, Hyperlipidemia, Hypothyroidism, IBS (irritable bowel syndrome), Joint pain, Lower extremity edema, Migraines, Palpitations, Shortness of breath, Tachycardia, Thyroid disease, Vitamin B 12 deficiency, and Vitamin D deficiency.. She is taking synthroid. But developed tachycardia, ankle edema.and hypotension with Vertigo on BP medication.  She gained 50 pounds , is menopausal. Was diagnosed with hypothyroidism 12 month ago.  She has seen Dr. [name] , who did a (negative) cortisol test.     Sleep relevant medical history:  Family medical /sleep history: maternal Aunt had a CPAP machine, passed away.   Social history:  Patient is working as a Biomedical engineer - IT- and lives in a household alone. Family status is single , without children. Her mother lives across the street.  The patient currently works from  home. Pets are present. 2 poodles. Tobacco use never .  ETOH use ; rare , Caffeine intake in form of Coffee( 1 mug in AM ) Soda( none ) Tea ( at lunch ) or energy drinks. Regular exercise- interrupted by Covid.    Sleep habits are as follows: Lunch is with her mom, generally the biggest meal. The patient's dinner time is between 8-8.30 PM. She eats a light dinner. She often falls asleep at her moms watching TV.  Returns to her house- has GERD- takes amitriptyline. She wears a mouth guard. She sis a mouth breather.  The patient goes to bed at 12- PM to 2 AM. She continues to sleep for 3 hours, wakes for 2 bathroom breaks, the first time at 4 AM.   The preferred sleep position is right lateral- , with the support of 2 pillows, the bed is flat-  she prefers a Psychologist, occupational.  pillows. Dreams are reportedly infrequent.  7 AM is the usual rise time. The patient wakes up with several  Alarms  At 6.30-- 6.45 AM .   She reports not feeling refreshed or restored in AM, with symptoms such as dry mouth, morning headaches,  and residual fatigue. Naps are taken  lasting several hours and are more refreshing than nocturnal sleep.    Review of Systems: Out of a complete 14 system review, the patient complains of only the following symptoms, and all other reviewed systems are negative.: 2/ 2021 :  Chief concern according to patient :  Non restorative sleep, weight gain and fatigue. She gained 50 pounds , is menopausal. Was diagnosed with hypothyroidism 12 month ago.   Fatigue, sleepiness , snoring, fragmented sleep, Insomnia - nocturia,dry mouth.   ADHD but intolerant to all medications, headaches   Headaches/ Migraines  better on ajovy. Scalp soreness.     How likely are you to doze in the following situations: 0 = not likely, 1 = slight chance, 2 = moderate chance, 3 = high chance   Sitting and Reading? Watching Television? Sitting inactive in a public place (theater or meeting)? As a passenger in a car for an  hour without a break? Lying down in the afternoon when circumstances permit? Sitting and talking to someone? Sitting quietly after lunch without alcohol? In a car, while stopped for a few minutes in traffic?   Total = 10/ 24 points   FSS was not endorsed anew at 52/ 63 points.  GDS  : she loves her job, feels happy, not overworked.  Worried about her mother.   Social History   Socioeconomic History   Marital status: Single    Spouse name: Not on file   Number of children: 0   Years of education: Not on file   Highest education level: Not on file  Occupational History   Occupation: Market researcher  Tobacco Use   Smoking status: Never   Smokeless tobacco: Never  Vaping Use   Vaping Use: Never used  Substance and Sexual Activity   Alcohol use: Yes    Comment: rarely   Drug use: Never   Sexual activity: Not on file  Other Topics Concern   Not on file  Social History Narrative   Not on file   Social Determinants of Health   Financial Resource Strain: Not on file  Food Insecurity: Not on file  Transportation Needs: Not on file  Physical Activity: Not on file  Stress: Not on file  Social Connections: Not on file    Family History  Problem Relation Age of Onset   Restless legs syndrome Mother    Arthritis Mother    Neuropathy Mother    Hyperlipidemia Mother    Depression Mother    Heart attack Father     Past Medical History:  Diagnosis Date   Anemia    Asthma    Back pain    Constipation    Depression    Dyspnea    Fatigue    Fluid retention    Hyperlipidemia    Hypothyroidism    IBS (irritable bowel syndrome)    Joint pain    Lower extremity edema    Migraines    Palpitations    Shortness of breath    Tachycardia    Thyroid disease    Vitamin B 12 deficiency    Vitamin D deficiency     Past Surgical  History:  Procedure Laterality Date   APPENDECTOMY     endometrial ablasion       Current Outpatient Medications on File Prior  to Visit  Medication Sig Dispense Refill   amitriptyline (ELAVIL) 50 MG tablet Take 50 mg by mouth at bedtime.   4   atomoxetine (STRATTERA) 25 MG capsule Take 1 capsule (25 mg total) by mouth every morning. 30 capsule 0   Bromelains (BROMELAIN PO) Take 500 mg by mouth daily as needed.     Carboxymethylcellulose Sod PF 1 % GEL Place 1 application into both eyes as needed.     Digestive Enzymes (DIGESTIVE ENZYME PO) Take 500 mg by mouth.     fexofenadine (ALLEGRA) 180 MG tablet Take by mouth.     fluconazole (DIFLUCAN) 150 MG tablet Take 1 tablet by mouth as needed.     fluticasone (FLONASE) 50 MCG/ACT nasal spray Place into the nose.     Fremanezumab-vfrm (AJOVY) 225 MG/1.5ML SOAJ Inject 225 mg into the skin every 30 (thirty) days. 1.68 mL 11   magnesium gluconate (MAGONATE) 500 MG tablet Take 500 mg by mouth daily.     Multiple Vitamins-Minerals (HAIR/SKIN/NAILS/BIOTIN PO) Take 1 tablet by mouth daily.     omeprazole (PRILOSEC) 40 MG capsule Take 1 capsule (40 mg total) by mouth daily. 30 capsule 0   ondansetron (ZOFRAN ODT) 4 MG disintegrating tablet Take 1 tablet (4 mg total) by mouth every 8 (eight) hours as needed. 20 tablet 1   SUMAtriptan (IMITREX) 6 MG/0.5ML SOLN injection Inject 0.5 mLs (6 mg total) into the skin every 2 (two) hours as needed for migraine or headache. May repeat in 2 hours if headache persists or recurs. 1 mL 3   thyroid (ARMOUR) 15 MG tablet Take 15 mg by mouth daily.     tiZANidine (ZANAFLEX) 4 MG tablet Take 1 tablet (4 mg total) by mouth every 6 (six) hours as needed. 30 tablet 1   UNABLE TO FIND Take 1 tablet by mouth daily. Med Name: Phytoceramides     Vitamin D, Ergocalciferol, (DRISDOL) 1.25 MG (50000 UNIT) CAPS capsule Take 1 capsule (50,000 Units total) by mouth every 7 (seven) days. 4 capsule 0   zolmitriptan (ZOMIG) 5 MG tablet Take 5 mg by mouth as needed for migraine.     No current facility-administered medications on file prior to visit.    Allergies   Allergen Reactions   Betadine [Povidone Iodine]    Azithromycin Rash   Ciprofloxacin Rash   Penicillins Rash    Cant remember the reaction or the severity of it   Tape Rash    Physical exam:  Today's Vitals   04/24/21 1525  BP: 114/72  Pulse: 85  Weight: 188 lb (85.3 kg)  Height: _0  (1.549 m)   Body mass index is 35.52 kg/m.   Wt Readings from Last 3 Encounters:  04/24/21 188 lb (85.3 kg)  04/03/21 179 lb (81.2 kg)  03/06/21 181 lb (82.1 kg)     Ht Readings from Last 3 Encounters:  04/24/21 _1  (1.549 m)  04/03/21 _2  (1.549 m)  03/06/21 _3  (1.549 m)      General: The patient is awake, alert and appears not in acute distress. The patient is well groomed. Head: Normocephalic, atraumatic. Neck is supple. Mallampati 4,  neck circumference: 16. 25  inches . Nasal airflow patent.  Retrognathia is not  seen.  Dental status: intact  Cardiovascular:  Regular rate and cardiac rhythm by  pulse, without distended neck veins. Respiratory: Lungs are clear to auscultation.  Skin:  Evidence of ankle edema, but no rash. Trunk: The patient's posture is erect.   Neurologic exam : The patient is awake and alert, oriented to place and time.      Cranial nerves: no loss of smell or taste reported  Pupils are equal and briskly reactive to light. Extraocular movements in vertical and horizontal planes were intact and without nystagmus. No Diplopia.  Facial motor strength is symmetric and tongue and uvula move midline.  Neck ROM : rotation, tilt and flexion extension were normal for age and shoulder shrug was symmetrical.    Motor exam:  Symmetric bulk, tone and ROM.   Normal tone  .   Sensory:  Normal. Proprioception tested in the upper extremities was normal.   Gait and station:  deferred.  Deep tendon reflexes: in the upper and lower extremities are symmetrically attenuated and intact.  Babinski response was deferred .      After spending a total time of 25 minutes  face to face including additional time for physical and neurologic examination, review of laboratory studies,  personal review of imaging studies, reports and results of other testing and review of referral information / records as far as provided in visit, I have established the following assessments:  Reviewed headache diary:  Here for 3 month f/u. Last seen 02-14-2021,Pt switched from topiramate to [place].  Pt has tolerated well. Pt is off all ADHD medications and has noticed improvements. Has had no migraines or HA since 10/28. She had left a phone message complaining about severe migraines while on TPM on 03-03-2021.    1) migraine headaches improved after recently diagnosed condition ADHD medications were found to trigger her migraines, she d/c topiramate also for triggering migraines. Ajovy now well tolerated. No need for additional Botox.   2)  Repeating HST to see if her apnea is improved by weight loss thus far. She has not been complaint with CPAP use, wakes with migraines. I had suggested to reset her CPAP to a lower upper range pressure from  15 cm water. Start an EPR,    3) ADHD  diagnosed but remains untreated.   I would like to thank Adria Dill Leonia Reader, FNP , and Dr. Dennard Nip, MD,   [address],  [place] [address] for allowing me to meet with and to take care of this pleasant patient.   Follow up through our NP within 12 months unless HST result warrants a sooner visit.   Electronically signed by: Larey Seat, MD 04/24/2021 3:33 PM  [facility] and [facility] Sleep Board certified by The [employer] of Sleep Medicine and Diplomate of the [employer] of Sleep Medicine. Board certified In Neurology through the [place], Fellow of the [employer] of Neurology. Medical Director of [employer].

## 2021-04-24 NOTE — Patient Instructions (Addendum)
Living With Attention Deficit Hyperactivity Disorder If you have been diagnosed with attention deficit hyperactivity disorder (ADHD), you may be relieved that you now know why you have felt or behaved a certain way. Still, you may feel overwhelmed about the treatment ahead. You may also wonder how to get the support you need and how to deal with the condition day-to-day. With treatment and support, you can live with ADHD and manage your symptoms. How to manage lifestyle changes Managing stress Stress is your body's reaction to life changes and events, both good and bad. To cope with the stress of an ADHD diagnosis, it may help to: Learn more about ADHD. Exercise regularly. Even a short daily walk can lower stress levels. Participate in training or education programs (including social skills training classes) that teach you to deal with symptoms.  Medicines Your health care provider may suggest certain medicines if he or she feels that they will help to improve your condition. Stimulant medicines are usually prescribed to treat ADHD, and therapy may also be prescribed. It is important to: Avoid using alcohol and other substances that may prevent your medicines from working properly (may interact). Talk with your pharmacist or health care provider about all the medicines that you take, their possible side effects, and what medicines are safe to take together. Make it your goal to take part in all treatment decisions (shared decision-making). Ask about possible side effects of medicines that your health care provider recommends, and tell him or her how you feel about having those side effects. It is best if shared decision-making with your health care provider is part of your total treatment plan. Relationships To strengthen your relationships with family members while treating your condition, consider taking part in family therapy. You might also attend self-help groups alone or with a loved one. Be  honest about how your symptoms affect your relationships. Make an effort to communicate respectfully instead of fighting, and find ways to show others that you care. Psychotherapy may be useful in helping you cope with how ADHD affects your relationships. How to recognize changes in your condition The following signs may mean that your treatment is working well and your condition is improving: Consistently being on time for appointments. Being more organized at home and work. Other people noticing improvements in your behavior. Achieving goals that you set for yourself. Thinking more clearly. The following signs may mean that your treatment is not working very well: Feeling impatience or more confusion. Missing, forgetting, or being late for appointments. An increasing sense of disorganization and messiness. More difficulty in reaching goals that you set for yourself. Loved ones becoming angry or frustrated with you. Follow these instructions at home: Take over-the-counter and prescription medicines only as told by your health care provider. Check with your health care provider before taking any new medicines. Create structure and an organized atmosphere at home. For example: Make a list of tasks, then rank them from most important to least important. Work on one task at a time until your listed tasks are done. Make a daily schedule and follow it consistently every day. Use an appointment calendar, and check it 2 or 3 times a day to keep on track. Keep it with you when you leave the house. Create spaces where you keep certain things, and always put things back in their places after you use them. Keep all follow-up visits as told by your health care provider. This is important. Where to find support Talking to others    Keep emotion out of important discussions and speak in a calm, logical way. Listen closely and patiently to your loved ones. Try to understand their point of view, and try to  avoid getting defensive. Take responsibility for the consequences of your actions. Ask that others do not take your behaviors personally. Aim to solve problems as they come up, and express your feelings instead of bottling them up. Talk openly about what you need from your loved ones and how they can support you. Consider going to family therapy sessions or having your family meet with a specialist who deals with ADHD-related behavior problems. Finances Not all insurance plans cover mental health care, so it is important to check with your insurance carrier. If paying for co-pays or counseling services is a problem, search for a local or county mental health care center. Public mental health care services may be offered there at a low cost or no cost when you are not able to see a private health care provider. If you are taking medicine for ADHD, you may be able to get the generic form, which may be less expensive than brand-name medicine. Some makers of prescription medicines also offer help to patients who cannot afford the medicines that they need. Questions to ask your health care provider: What are the risks and benefits of taking medicines? Would I benefit from therapy? How often should I follow up with a health care provider? Contact a health care provider if: You have side effects from your medicines, such as: Repeated muscle twitches, coughing, or speech outbursts. Sleep problems. Loss of appetite. Depression. New or worsening behavior problems. Dizziness. Unusually fast heartbeat. Stomach pains. Headaches. Get help right away if: You have a severe reaction to a medicine. Your behavior suddenly gets worse. Summary With treatment and support, you can live with ADHD and manage your symptoms. The medicines that are most often prescribed for ADHD are stimulants. Consider taking part in family therapy or self-help groups with family members or friends. When you talk with friends  and family about your ADHD, be patient and communicate openly. Take over-the-counter and prescription medicines only as told by your health care provider. Check with your health care provider before taking any new medicines. This information is not intended to replace advice given to you by your health care provider. Make sure you discuss any questions you have with your health care provider. Document Revised: 11/16/2019 Document Reviewed: 11/16/2019 Elsevier Patient Education  2022 [employer] Quality Sleep Information, Adult Quality sleep is important for your mental and physical health. It also improves your quality of life. Quality sleep means you: Are asleep for most of the time you are in bed. Fall asleep within 30 minutes. Wake up no more than once a night.  Are awake for no longer than 20 minutes if you do wake up during the night. Most adults need 7-8 hours of quality sleep each night. How can poor sleep affect me? If you do not get enough quality sleep, you may have: Mood swings. Daytime sleepiness. Confusion. Decreased reaction time. Sleep disorders, such as insomnia and sleep apnea. Difficulty with: Solving problems. Coping with stress. Paying attention. These issues may affect your performance and productivity at work, school, and at home. Lack of sleep may also put you at higher risk for accidents, suicide, and risky behaviors. If you do not get quality sleep you may also be at higher risk for several health problems, including: Infections. Type 2 diabetes. Heart disease. High blood  pressure. Obesity. Worsening of long-term conditions, like arthritis, kidney disease, depression, Parkinson's disease, and epilepsy. What actions can I take to get more quality sleep?   Stick to a sleep schedule. Go to sleep and wake up at about the same time each day. Do not try to sleep less on weekdays and make up for lost sleep on weekends. This does not work. Try to get about 30  minutes of exercise on most days. Do not exercise 2-3 hours before going to bed. Limit naps during the day to 30 minutes or less. Do not use any products that contain nicotine or tobacco, such as cigarettes or e-cigarettes. If you need help quitting, ask your health care provider. Do not drink caffeinated beverages for at least 8 hours before going to bed. Coffee, tea, and some sodas contain caffeine. Do not drink alcohol close to bedtime. Do not eat large meals close to bedtime. Do not take naps in the late afternoon. Try to get at least 30 minutes of sunlight every day. Morning sunlight is best. Make time to relax before bed. Reading, listening to music, or taking a hot bath promotes quality sleep. Make your bedroom a place that promotes quality sleep. Keep your bedroom dark, quiet, and at a comfortable room temperature. Make sure your bed is comfortable. Take out sleep distractions like TV, a computer, smartphone, and bright lights. If you are lying awake in bed for longer than 20 minutes, get up and do a relaxing activity until you feel sleepy. Work with your health care provider to treat medical conditions that may affect sleeping, such as: Nasal obstruction. Snoring. Sleep apnea and other sleep disorders. Talk to your health care provider if you think any of your prescription medicines may cause you to have difficulty falling or staying asleep. If you have sleep problems, talk with a sleep consultant. If you think you have a sleep disorder, talk with your health care provider about getting evaluated by a specialist. Where to find more information National Sleep Foundation website: https://sleepfoundation.org National Heart, Lung, and Blood Institute (NHLBI): https://hall.info/.pdf Centers for Disease Control and Prevention (CDC): [employer]ail[employer].is Contact a health care provider if you: Have trouble getting to sleep or staying asleep. Often  wake up very early in the morning and cannot get back to sleep. Have daytime sleepiness. Have daytime sleep attacks of suddenly falling asleep and sudden muscle weakness (narcolepsy). Have a tingling sensation in your legs with a strong urge to move your legs (restless legs syndrome). Stop breathing briefly during sleep (sleep apnea). Think you have a sleep disorder or are taking a medicine that is affecting your quality of sleep. Summary Most adults need 7-8 hours of quality sleep each night. Getting enough quality sleep is an important part of health and well-being. Make your bedroom a place that promotes quality sleep and avoid things that may cause you to have poor sleep, such as alcohol, caffeine, smoking, and large meals. Talk to your health care provider if you have trouble falling asleep or staying asleep. This information is not intended to replace advice given to you by your health care provider. Make sure you discuss any questions you have with your health care provider. Document Revised: 09/09/2017 Document Reviewed: 09/09/2017 Elsevier Patient Education  2022 [employer] Inc. Insomnia Insomnia is a sleep disorder that makes it difficult to fall asleep or stay asleep. Insomnia can cause fatigue, low energy, difficulty concentrating, mood swings, and poor performance at work or school. There are three different ways to  classify insomnia: Difficulty falling asleep. Difficulty staying asleep. Waking up too early in the morning. Any type of insomnia can be long-term (chronic) or short-term (acute). Both are common. Short-term insomnia usually lasts for three months or less. Chronic insomnia occurs at least three times a week for longer than three months. What are the causes? Insomnia may be caused by another condition, situation, or substance, such as: Anxiety. Certain medicines. Gastroesophageal reflux disease (GERD) or other gastrointestinal conditions. Asthma or other breathing  conditions. Restless legs syndrome, sleep apnea, or other sleep disorders. Chronic pain. Menopause. Stroke. Abuse of alcohol, tobacco, or illegal drugs. Mental health conditions, such as depression. Caffeine. Neurological disorders, such as Alzheimer's disease. An overactive thyroid (hyperthyroidism). Sometimes, the cause of insomnia may not be known. What increases the risk? Risk factors for insomnia include: Gender. Women are affected more often than men. Age. Insomnia is more common as you get older. Stress. Lack of exercise. Irregular work schedule or working night shifts. Traveling between different time zones. Certain medical and mental health conditions. What are the signs or symptoms? If you have insomnia, the main symptom is having trouble falling asleep or having trouble staying asleep. This may lead to other symptoms, such as: Feeling fatigued or having low energy. Feeling nervous about going to sleep. Not feeling rested in the morning. Having trouble concentrating. Feeling irritable, anxious, or depressed. How is this diagnosed? This condition may be diagnosed based on: Your symptoms and medical history. Your health care provider may ask about: Your sleep habits. Any medical conditions you have. Your mental health. A physical exam. How is this treated? Treatment for insomnia depends on the cause. Treatment may focus on treating an underlying condition that is causing insomnia. Treatment may also include: Medicines to help you sleep. Counseling or therapy. Lifestyle adjustments to help you sleep better. Follow these instructions at home: Eating and drinking  Limit or avoid alcohol, caffeinated beverages, and cigarettes, especially close to bedtime. These can disrupt your sleep. Do not eat a large meal or eat spicy foods right before bedtime. This can lead to digestive discomfort that can make it hard for you to sleep. Sleep habits  Keep a sleep diary to help  you and your health care provider figure out what could be causing your insomnia. Write down: When you sleep. When you wake up during the night. How well you sleep. How rested you feel the next day. Any side effects of medicines you are taking. What you eat and drink. Make your bedroom a dark, comfortable place where it is easy to fall asleep. Put up shades or blackout curtains to block light from outside. Use a white noise machine to block noise. Keep the temperature cool. Limit screen use before bedtime. This includes: Watching TV. Using your smartphone, tablet, or computer. Stick to a routine that includes going to bed and waking up at the same times every day and night. This can help you fall asleep faster. Consider making a quiet activity, such as reading, part of your nighttime routine. Try to avoid taking naps during the day so that you sleep better at night. Get out of bed if you are still awake after 15 minutes of trying to sleep. Keep the lights down, but try reading or doing a quiet activity. When you feel sleepy, go back to bed. General instructions Take over-the-counter and prescription medicines only as told by your health care provider. Exercise regularly, as told by your health care provider. Avoid exercise starting several  hours before bedtime. Use relaxation techniques to manage stress. Ask your health care provider to suggest some techniques that may work well for you. These may include: Breathing exercises. Routines to release muscle tension. Visualizing peaceful scenes. Make sure that you drive carefully. Avoid driving if you feel very sleepy. Keep all follow-up visits as told by your health care provider. This is important. Contact a health care provider if: You are tired throughout the day. You have trouble in your daily routine due to sleepiness. You continue to have sleep problems, or your sleep problems get worse. Get help right away if: You have serious  thoughts about hurting yourself or someone else. If you ever feel like you may hurt yourself or others, or have thoughts about taking your own life, get help right away. You can go to your nearest emergency department or call: Your local emergency services (911 in the [place] A suicide crisis helpline, such as the National Suicide Prevention Lifeline at (570)641-5561 or 988 in the [place] This is open 24 hours a day. Summary Insomnia is a sleep disorder that makes it difficult to fall asleep or stay asleep. Insomnia can be long-term (chronic) or short-term (acute). Treatment for insomnia depends on the cause. Treatment may focus on treating an underlying condition that is causing insomnia. Keep a sleep diary to help you and your health care provider figure out what could be causing your insomnia. This information is not intended to replace advice given to you by your health care provider. Make sure you discuss any questions you have with your health care provider. Document Revised: 12/26/2020 Document Reviewed: 04/12/2020 Elsevier Patient Education  2022 [employer].

## 2021-04-30 ENCOUNTER — Other Ambulatory Visit (INDEPENDENT_AMBULATORY_CARE_PROVIDER_SITE_OTHER): Payer: Self-pay | Admitting: Family Medicine

## 2021-04-30 DIAGNOSIS — K2100 Gastro-esophageal reflux disease with esophagitis, without bleeding: Secondary | ICD-10-CM

## 2021-05-01 ENCOUNTER — Ambulatory Visit (INDEPENDENT_AMBULATORY_CARE_PROVIDER_SITE_OTHER): Payer: 59 | Admitting: Family Medicine

## 2021-05-01 ENCOUNTER — Other Ambulatory Visit: Payer: Self-pay

## 2021-05-01 ENCOUNTER — Encounter (INDEPENDENT_AMBULATORY_CARE_PROVIDER_SITE_OTHER): Payer: Self-pay | Admitting: Family Medicine

## 2021-05-01 VITALS — BP 107/71 | HR 86 | Temp 98.2°F | Ht 61.0 in | Wt 182.0 lb

## 2021-05-01 DIAGNOSIS — E7849 Other hyperlipidemia: Secondary | ICD-10-CM

## 2021-05-01 DIAGNOSIS — Z9189 Other specified personal risk factors, not elsewhere classified: Secondary | ICD-10-CM | POA: Diagnosis not present

## 2021-05-01 DIAGNOSIS — E559 Vitamin D deficiency, unspecified: Secondary | ICD-10-CM | POA: Diagnosis not present

## 2021-05-01 DIAGNOSIS — R5383 Other fatigue: Secondary | ICD-10-CM | POA: Diagnosis not present

## 2021-05-01 DIAGNOSIS — Z6838 Body mass index (BMI) 38.0-38.9, adult: Secondary | ICD-10-CM

## 2021-05-01 MED ORDER — VITAMIN D (ERGOCALCIFEROL) 1.25 MG (50000 UNIT) PO CAPS
50000.0000 [IU] | ORAL_CAPSULE | ORAL | 0 refills | Status: DC
Start: 2021-05-01 — End: 2021-07-17

## 2021-05-01 NOTE — Progress Notes (Signed)
Chief Complaint:   OBESITY Alexandria Ward is here to discuss her progress with her obesity treatment plan along with follow-up of her obesity related diagnoses. Alexandria Ward is on keeping a food journal and adhering to recommended goals of 1000-1200 calories and 75+ grams of protein daily and states she is following her eating plan approximately 50% of the time. Alexandria Ward states she is doing 0 minutes 0 times per week.  Today's visit was #: 25 Starting weight: 206 lbs Starting date: 07/14/2019 Today's weight: 182 lbs Today's date: 05/01/2021 Total lbs lost to date: 24 Total lbs lost since last in-office visit: 0  Interim History: Alexandria Ward has been off track in the last couple of weeks being off her routine, and with increased traveling and caring for her injured mother.  Subjective:   1. Hyperlipidemia, pure Alexandria Ward's LDL is elevated, and she is not on statin. Her triglycerides and HDL are at goal. She is working on decreasing cholesterol in her diet.  2. Caregiver with fatigue Alexandria Ward is caring for her elderly mother, and she is feeling overwhelmed more and more often. She has less time to care for herself.  3. Vitamin D deficiency Alexandria Ward's Vit D level is at goal. She denies nausea, vomiting, or muscle weakness.  4. At risk for heart disease Alexandria Ward is at a higher than average risk for cardiovascular disease due to obesity.   Assessment/Plan:   1. Hyperlipidemia, pure Cardiovascular risk and specific lipid/LDL goals reviewed. We discussed several lifestyle modifications today. Alexandria Ward will continue to work on diet, exercise and weight loss efforts. She is to discuss the need for statin with her primary care provider. Orders and follow up as documented in patient record.   2. Caregiver with fatigue Alexandria Ward was educated on caregiver fatigue, and the need to look into services  to help her. Support and encouragement was given.  3. Vitamin D deficiency Low Vitamin D level contributes to fatigue and are  associated with obesity, breast, and colon cancer. We will refill prescription Vitamin D for 1 month. Alexandria Ward will follow-up for routine testing of Vitamin D, at least 2-3 times per year to avoid over-replacement.  - Vitamin D, Ergocalciferol, (DRISDOL) 1.25 MG (50000 UNIT) CAPS capsule; Take 1 capsule (50,000 Units total) by mouth every 7 (seven) days.  Dispense: 4 capsule; Refill: 0  4. At risk for heart disease Alexandria Ward was given approximately 15 minutes of coronary artery disease prevention counseling today. She is 55 y.o. female and has risk factors for heart disease including obesity. We discussed intensive lifestyle modifications today with an emphasis on specific weight loss instructions and strategies.   Repetitive spaced learning was employed today to elicit superior memory formation and behavioral change.  5. Obesity with current BMI 34.5 Alexandria Ward is currently in the action stage of change. As such, her goal is to continue with weight loss efforts. She has agreed to keeping a food journal and adhering to recommended goals of 1000-1200 calories and 75+ grams of protein daily.   Behavioral modification strategies: increasing lean protein intake and meal planning and cooking strategies.  Alexandria Ward has agreed to follow-up with our clinic in 4 weeks. She was informed of the importance of frequent follow-up visits to maximize her success with intensive lifestyle modifications for her multiple health conditions.   Objective:   Blood pressure 107/71, pulse 86, temperature 98.2 F (36.8 C), height 5\' 1"  (1.549 m), weight 182 lb (82.6 kg), SpO2 100 %. Body mass index is 34.39 kg/m.  General:  Cooperative, alert, well developed, in no acute distress. HEENT: Conjunctivae and lids unremarkable. Cardiovascular: Regular rhythm.  Lungs: Normal work of breathing. Neurologic: No focal deficits.   Lab Results  Component Value Date   CREATININE 0.64 04/03/2021   BUN 14 04/03/2021   NA 143 04/03/2021   K  4.6 04/03/2021   CL 106 04/03/2021   CO2 21 04/03/2021   Lab Results  Component Value Date   ALT 16 04/03/2021   AST 16 04/03/2021   ALKPHOS 87 04/03/2021   BILITOT 0.2 04/03/2021   Lab Results  Component Value Date   HGBA1C 5.4 04/03/2021   HGBA1C 5.6 08/13/2020   HGBA1C 5.5 04/24/2020   HGBA1C 5.6 09/19/2019   HGBA1C 5.8 (H) 07/14/2019   Lab Results  Component Value Date   INSULIN 7.1 04/03/2021   INSULIN 6.8 08/13/2020   INSULIN 4.8 09/19/2019   INSULIN 12.3 07/14/2019   Lab Results  Component Value Date   TSH 2.41 04/24/2020   Lab Results  Component Value Date   CHOL 187 04/03/2021   HDL 54 04/03/2021   LDLCALC 121 (H) 04/03/2021   TRIG 63 04/03/2021   Lab Results  Component Value Date   VD25OH 57.7 04/03/2021   VD25OH 50.5 08/13/2020   VD25OH 42.4 01/18/2020   Lab Results  Component Value Date   WBC 6.1 04/24/2020   HGB 13.6 04/24/2020   HCT 42 04/24/2020   MCV 98 (H) 09/19/2019   PLT 297 04/24/2020   No results found for: IRON, TIBC, FERRITIN  Attestation Statements:   Reviewed by clinician on day of visit: allergies, medications, problem list, medical history, surgical history, family history, social history, and previous encounter notes.   I, Burt Knack, am acting as transcriptionist for Quillian Quince, MD.  I have reviewed the above documentation for accuracy and completeness, and I agree with the above. -  Quillian Quince, MD

## 2021-05-08 ENCOUNTER — Telehealth: Payer: Self-pay | Admitting: Neurology

## 2021-05-08 NOTE — Telephone Encounter (Signed)
LVM for pt to call me back to schedule sleep study  

## 2021-05-13 ENCOUNTER — Telehealth: Payer: Self-pay | Admitting: Neurology

## 2021-05-13 NOTE — Telephone Encounter (Signed)
LVM for pt to call me back to schedule sleep study  

## 2021-05-14 ENCOUNTER — Telehealth: Payer: Self-pay | Admitting: Neurology

## 2021-05-14 NOTE — Telephone Encounter (Signed)
LVM for pt to call me back to schedule sleep study  

## 2021-05-15 ENCOUNTER — Telehealth: Payer: Self-pay | Admitting: Neurology

## 2021-05-15 NOTE — Telephone Encounter (Signed)
Called the patient back.  Patient states that she had picked up 3 mg melatonin and felt a little groggy after taking it the next day.  She wanted guidance on what to do.  Advised the patient she can attempt to break the tablet in half and try it at a lower dose and see if she tolerates that.  Patient was appreciative for the call back.

## 2021-05-15 NOTE — Telephone Encounter (Signed)
Pt. Was called today to schedule her HST. However, she had a few questions about how much melatonin Dr. Vickey Huger wanted her to take. Please give her a call at earliest convenience

## 2021-05-27 ENCOUNTER — Ambulatory Visit (INDEPENDENT_AMBULATORY_CARE_PROVIDER_SITE_OTHER): Payer: 59 | Admitting: Neurology

## 2021-05-27 DIAGNOSIS — G4733 Obstructive sleep apnea (adult) (pediatric): Secondary | ICD-10-CM

## 2021-05-27 DIAGNOSIS — Z87898 Personal history of other specified conditions: Secondary | ICD-10-CM

## 2021-05-27 DIAGNOSIS — G43001 Migraine without aura, not intractable, with status migrainosus: Secondary | ICD-10-CM

## 2021-05-27 DIAGNOSIS — G478 Other sleep disorders: Secondary | ICD-10-CM

## 2021-05-27 DIAGNOSIS — F901 Attention-deficit hyperactivity disorder, predominantly hyperactive type: Secondary | ICD-10-CM

## 2021-05-28 ENCOUNTER — Encounter: Payer: Self-pay | Admitting: Neurology

## 2021-05-28 NOTE — Progress Notes (Signed)
[facility] SLEEP TEST REPORT ( by Watch PAT)   STUDY DATE:  05-27-2021 DOB:  1966-01-06    ORDERING CLINICIAN: Melvyn Novas, MD  REFERRING CLINICIAN: Zoe Lan.   CLINICAL INFORMATION/HISTORY: 04-24-2021 Presents today for a referral to address migraines. SHere for 3 month f/u. Pt switched from topiramate to Ajovy.  Pt has tolerated well. Pt is off all ADHD medications and has noticed improvements. Has had no migraines or HA since 10/28. Marland Kitchen complaints of feeling tired all the time, their dentist suggested that she consider checking out OSA. she does know that she snores.  She had been on atomoxetine , Strattera,  for compulsive eating.  Mrs. Creson  reports that she was finally tested for adult attention deficit disorder and was diagnosed with it.  She had headaches with all of these medications:  She started several stimulant medications at atypical stimulant medications.  She started on 10-25 with her first stimulant medication Vyvanse but was switched on 10.28 22 to methylphenidate.  Both medications triggered headaches so she could not tolerate it either.  Now off all ADHD medications, she has been headache free since 04-12-21. Sleep hygiene and sleep habits not changed, watching videos at night. Melatonin can be tried.  HST repeat to see if weight loss has reduced the AHI since 06-2019.      Epworth sleepiness score: 12 /24.   BMI: 40.8 kg/m   Neck Circumference: 16"   FINDINGS:   Sleep Summary:   Total Recording Time (hours, min):   Total recording time for this patient amounted to 7 hours 37 minutes of which 6 hours 27 minutes was a calculated total sleep time with a 24% REM sleep portion.  Total Sleep Time (hours, min):                                                      Respiratory Indices:   Calculated pAHI (per hour): Calculated apnea-hypopnea index was 14.5/h.  During REM sleep 20.1/h and during non-REM sleep AHI was 12.7/h.                                                  Positional AHI: Supine AHI was 18.5/h and sleep on the right side was associated with an AHI of only 4.9/h.  Prone sleep 8.6/h, left-sided sleep 9/h.  Snoring level was moderate with a mean volume of 41 dB but accompanied about 42% of total sleep time.  This is the equivalent of 162 minutes of sleep.                                                 Oxygen Saturation Statistics:    O2 Saturation Range (%): Saturation range between a minimum of 81 and a maximum of 99% with a mean saturation of 95% for oxygen.  O2 Saturation (minutes) <89%:     0.8 minutes or 2 per mille of total sleep time      Pulse Rate Statistics:   Pulse Range: Varied between 55 and 108 beats per minute with a mean heart rate of 80 bpm               IMPRESSION:  This HST confirms the presence of mild apnea which only became moderate in rem sleep, there was a strong supine dependency of apnea.  No associated hypoxemia and normal heart rate variability.  Sustained moderately loud snoring was also noted.   RECOMMENDATION: Happy Birthday!   REM dependent apnea is usually most responsive to CPAP titration.  If the patient feels that positive airway pressure is not what she would like to try to treat apnea and snoring the patient may consider a dental device or inspire.   CPAP would be recommended for a range of pressures from 5-15 cm water, 2 cm EPR and mask of choice. Heated humidification.    A dental device is not likely to eliminate all apnea as it treats mostly non-REM sleep apnea but in combination with avoiding supine sleep the patient would probably have much milder apnea than currently.   The patient also could use an inspire device if she would reach a BMI of 32 or below -with the similar prediction of outcome    INTERPRETING PHYSICIAN:   Melvyn Novas, MD   Medical Director of [facility] at [facility].

## 2021-05-29 ENCOUNTER — Other Ambulatory Visit: Payer: Self-pay

## 2021-05-29 ENCOUNTER — Ambulatory Visit (INDEPENDENT_AMBULATORY_CARE_PROVIDER_SITE_OTHER): Payer: 59 | Admitting: Family Medicine

## 2021-05-29 ENCOUNTER — Encounter (INDEPENDENT_AMBULATORY_CARE_PROVIDER_SITE_OTHER): Payer: Self-pay | Admitting: Family Medicine

## 2021-05-29 VITALS — BP 113/73 | HR 79 | Temp 98.9°F | Ht 61.0 in | Wt 184.0 lb

## 2021-05-29 DIAGNOSIS — E559 Vitamin D deficiency, unspecified: Secondary | ICD-10-CM | POA: Diagnosis not present

## 2021-05-29 DIAGNOSIS — K2100 Gastro-esophageal reflux disease with esophagitis, without bleeding: Secondary | ICD-10-CM

## 2021-05-29 DIAGNOSIS — Z9189 Other specified personal risk factors, not elsewhere classified: Secondary | ICD-10-CM

## 2021-05-29 DIAGNOSIS — Z6838 Body mass index (BMI) 38.0-38.9, adult: Secondary | ICD-10-CM

## 2021-05-29 MED ORDER — OMEPRAZOLE 40 MG PO CPDR: 40.0000 mg | DELAYED_RELEASE_CAPSULE | Freq: Every day | ORAL | 0 refills | Status: DC

## 2021-05-29 NOTE — Progress Notes (Signed)
Chief Complaint:   OBESITY Alexandria Ward is here to discuss her progress with her obesity treatment plan along with follow-up of her obesity related diagnoses. Alexandria Ward is on keeping a food journal and adhering to recommended goals of 1000-1200 calories and 75+ grams of protein daily and states she is following her eating plan approximately 40% of the time. [name] states she is doing 0 minutes 0 times per week.  Today's visit was #: 26 Starting weight: 206 lbs Starting date: 07/14/2019 Today's weight: 184 lbs Today's date: 05/29/2021 Total lbs lost to date: 22 Total lbs lost since last in-office visit: 0  Interim History: Alexandria Ward has done some celebration eating between Thanksgiving and her recent birthday. She is planning a smaller Christmas celebration this year.  Subjective:   1. Vitamin D deficiency Alexandria Ward is stable on Vit D, and her last lab was at goal. No side effects was noted.  2. Gastroesophageal reflux disease with esophagitis, unspecified whether hemorrhage Alexandria Ward is stable on omeprazole. Her weight is creeping up which can worsen her GERD but she is trying to be mindful.  3. At risk for heart disease Alexandria Ward is at a higher than average risk for cardiovascular disease due to obesity.   Assessment/Plan:   1. Vitamin D deficiency Alexandria Ward will continue prescription Vitamin D 50,000 IU every week. We will recheck labs in 1-2 months, and she will follow-up for routine testing of Vitamin D, at least 2-3 times per year to avoid over-replacement.  2. Gastroesophageal reflux disease with esophagitis, unspecified whether hemorrhage Intensive lifestyle modifications are the first line treatment for this issue. We discussed several lifestyle modifications today. We will refill omeprazole for 1 month. Alexandria Ward will continue to work on diet and weight loss efforts. Orders and follow up as documented in patient record.   - omeprazole (PRILOSEC) 40 MG capsule; Take 1 capsule (40 mg total) by mouth  daily.  Dispense: 30 capsule; Refill: 0  3. At risk for heart disease Alexandria Ward was given approximately 15 minutes of coronary artery disease prevention counseling today. She is 55 y.o. female and has risk factors for heart disease including obesity. We discussed intensive lifestyle modifications today with an emphasis on specific weight loss instructions and strategies.   Repetitive spaced learning was employed today to elicit superior memory formation and behavioral change.  4. Obesity with current BMI 34.8 Alexandria Ward is currently in the action stage of change. As such, her goal is to maintain weight for now over Christmas and then go back to weight loss in January. She has agreed to keeping a food journal and adhering to recommended goals of 1000-1200 calories and 75+ grams of protein daily.   Behavioral modification strategies: increasing lean protein intake and holiday eating strategies .  Alexandria Ward has agreed to follow-up with our clinic in 3 to 4 weeks. She was informed of the importance of frequent follow-up visits to maximize her success with intensive lifestyle modifications for her multiple health conditions.   Objective:   Blood pressure 113/73, pulse 79, temperature 98.9 F (37.2 C), height 5\' 1"  (1.549 m), weight 184 lb (83.5 kg), SpO2 98 %. Body mass index is 34.77 kg/m.  General: Cooperative, alert, well developed, in no acute distress. HEENT: Conjunctivae and lids unremarkable. Cardiovascular: Regular rhythm.  Lungs: Normal work of breathing. Neurologic: No focal deficits.   Lab Results  Component Value Date   CREATININE 0.64 04/03/2021   BUN 14 04/03/2021   NA 143 04/03/2021   K 4.6 04/03/2021  CL 106 04/03/2021   CO2 21 04/03/2021   Lab Results  Component Value Date   ALT 16 04/03/2021   AST 16 04/03/2021   ALKPHOS 87 04/03/2021   BILITOT 0.2 04/03/2021   Lab Results  Component Value Date   HGBA1C 5.4 04/03/2021   HGBA1C 5.6 08/13/2020   HGBA1C 5.5 04/24/2020    HGBA1C 5.6 09/19/2019   HGBA1C 5.8 (H) 07/14/2019   Lab Results  Component Value Date   INSULIN 7.1 04/03/2021   INSULIN 6.8 08/13/2020   INSULIN 4.8 09/19/2019   INSULIN 12.3 07/14/2019   Lab Results  Component Value Date   TSH 2.41 04/24/2020   Lab Results  Component Value Date   CHOL 187 04/03/2021   HDL 54 04/03/2021   LDLCALC 121 (H) 04/03/2021   TRIG 63 04/03/2021   Lab Results  Component Value Date   VD25OH 57.7 04/03/2021   VD25OH 50.5 08/13/2020   VD25OH 42.4 01/18/2020   Lab Results  Component Value Date   WBC 6.1 04/24/2020   HGB 13.6 04/24/2020   HCT 42 04/24/2020   MCV 98 (H) 09/19/2019   PLT 297 04/24/2020   No results found for: IRON, TIBC, FERRITIN  Attestation Statements:   Reviewed by clinician on day of visit: allergies, medications, problem list, medical history, surgical history, family history, social history, and previous encounter notes.   I, Burt Knack, am acting as transcriptionist for Quillian Quince, MD.  I have reviewed the above documentation for accuracy and completeness, and I agree with the above. -  Quillian Quince, MD

## 2021-05-30 DIAGNOSIS — Z87898 Personal history of other specified conditions: Secondary | ICD-10-CM | POA: Insufficient documentation

## 2021-05-30 NOTE — Procedures (Signed)
[facility] SLEEP TEST REPORT ( by Watch PAT)   STUDY DATE:  05-27-2021 DOB:  Sep 27, 1965    ORDERING CLINICIAN: Melvyn Novas, MD  REFERRING CLINICIAN: Zoe Lan.   CLINICAL INFORMATION/HISTORY: 04-24-2021 Presents today for a referral to address migraines. SHere for 3 month f/u. Pt switched from topiramate to Ajovy.  Pt has tolerated well. Pt is off all ADHD medications and has noticed improvements. Has had no migraines or HA since 10/28. Marland Kitchen complaints of feeling tired all the time, their dentist suggested that she consider checking out OSA. she does know that she snores.  She had been on atomoxetine , Strattera,  for compulsive eating.  Mrs. Duda  reports that she was finally tested for adult attention deficit disorder and was diagnosed with it.  She had headaches with all of these medications:  She started several stimulant medications at atypical stimulant medications.  She started on 10-25 with her first stimulant medication Vyvanse but was switched on 10.28 22 to methylphenidate.  Both medications triggered headaches so she could not tolerate it either.  Now off all ADHD medications, she has been headache free since 04-12-21. Sleep hygiene and sleep habits not changed, watching videos at night. Melatonin can be tried.  HST repeat to see if weight loss has reduced the AHI since 06-2019.      Epworth sleepiness score: 12 /24.   BMI: 40.8 kg/m   Neck Circumference: 16"   FINDINGS:   Sleep Summary:   Total Recording Time (hours, min):   Total recording time for this patient amounted to 7 hours 37 minutes of which 6 hours 27 minutes was a calculated total sleep time with a 24% REM sleep portion.  Total Sleep Time (hours, min):                                                      Respiratory Indices:   Calculated pAHI (per hour): Calculated apnea-hypopnea index was 14.5/h.  During REM sleep 20.1/h and during non-REM sleep AHI was 12.7/h.                                                  Positional AHI: Supine AHI was 18.5/h and sleep on the right side was associated with an AHI of only 4.9/h.  Prone sleep 8.6/h, left-sided sleep 9/h.  Snoring level was moderate with a mean volume of 41 dB but accompanied about 42% of total sleep time.  This is the equivalent of 162 minutes of sleep.                                                 Oxygen Saturation Statistics:    O2 Saturation Range (%): Saturation range between a minimum of 81 and a maximum of 99% with a mean saturation of 95% for oxygen.  O2 Saturation (minutes) <89%:     0.8 minutes or 2 per mille of total sleep time      Pulse Rate Statistics:   Pulse Range: Varied between 55 and 108 beats per minute with a mean heart rate of 80 bpm               IMPRESSION:  This HST confirms the presence of mild apnea which only became moderate in rem sleep, there was a strong supine dependency of apnea.  No associated hypoxemia and normal heart rate variability.  Sustained moderately loud snoring was also noted.   RECOMMENDATION: Happy Birthday!   REM dependent apnea is usually most responsive to CPAP titration.  If the patient feels that positive airway pressure is not what she would like to try to treat apnea (and snoring!) the patient may consider a dental device or inspire.   Auto CPAP would be recommended for a range of pressures from 5-15 cm water, 2 cm EPR and mask of choice. Heated humidification.    A dental device is not likely to eliminate all apnea as it treats mostly non-REM sleep apnea and snoring, but in combination with avoiding the supine sleep position this patient would probably have much milder apnea than currently.   The patient also could use an inspire device if she would reach a BMI of 32 or below -with the similar prediction of outcome ( avoiding supine sleep and using Inspire)     INTERPRETING PHYSICIAN:   Melvyn Novas, MD    Medical Director of [facility] at [facility].

## 2021-05-30 NOTE — Addendum Note (Signed)
Addended by: Melvyn Novas on: 05/30/2021 02:46 PM   Modules accepted: Orders

## 2021-05-30 NOTE — Progress Notes (Signed)
IMPRESSION:  This HST confirms the presence of mild apnea which only became moderate in rem sleep, there was a strong supine dependency of apnea.  No associated hypoxemia and normal heart rate variability.  Sustained moderately loud snoring was also noted.  RECOMMENDATION: Happy Birthday!   REM dependent apnea is usually most responsive to CPAP titration.   Auto CPAP would be recommended for a range of pressures from 5-15 cm water, 2 cm EPR and mask of choice. Heated humidification.   If the patient feels that positive airway pressure is not what she would like to try to treat apnea (and snoring!) the patient may consider a dental device or inspire.    A dental device is not likely to eliminate all apnea as it treats mostly non-REM sleep apnea and snoring, but in combination with avoiding the supine sleep position this patient would probably have much milder apnea than currently.  The patient also could use an inspire device if she would reach a BMI of 32 or below -with the similar prediction of outcome ( avoiding supine sleep and using Inspire)    INTERPRETING PHYSICIAN: Melvyn Novas, MD

## 2021-05-31 ENCOUNTER — Encounter: Payer: Self-pay | Admitting: Neurology

## 2021-06-06 ENCOUNTER — Encounter: Payer: Self-pay | Admitting: Neurology

## 2021-06-06 ENCOUNTER — Ambulatory Visit: Payer: 59 | Admitting: Neurology

## 2021-06-06 VITALS — BP 100/66 | HR 109 | Ht 61.0 in | Wt 186.5 lb

## 2021-06-06 DIAGNOSIS — G4733 Obstructive sleep apnea (adult) (pediatric): Secondary | ICD-10-CM | POA: Diagnosis not present

## 2021-06-06 DIAGNOSIS — G478 Other sleep disorders: Secondary | ICD-10-CM | POA: Diagnosis not present

## 2021-06-06 DIAGNOSIS — M7989 Other specified soft tissue disorders: Secondary | ICD-10-CM | POA: Diagnosis not present

## 2021-06-06 NOTE — Patient Instructions (Addendum)
Tennis ball method  Sleep balance device [employer]. [employer] Night[employer]ance [employer] [id number]

## 2021-06-06 NOTE — Progress Notes (Signed)
SLEEP MEDICINE CLINIC    Provider:  Larey Seat, MD  Primary Care Physician:  Holland Commons, [name] [address]     Referring Provider: Redgie Grayer, MD         Chief Complaint according to patient   Patient presents with:     New Patient (Initial Visit)     Presents today for a referral to address migraines. SHere for 3 month f/u. Pt switched from topiramate to Ajovy.  Pt has tolerated well. Pt is off all ADHD medications and has noticed improvements. Has had no migraines or HA since 10/28. Marland Kitchen complaints of feeling tired all the time, their dentist suggested that she consider checking out OSA. she does know that she snores.       HISTORY     06-06-2021:   Alexandria Ward is a 55 year- old Caucasian female patient seen in a RV on 06/06/2021.  We are meeting today to discuss the results of her home sleep test dated 08-10-2019.  She was seen to have mild sleep apnea with an overall AHI of 9.2/h under 10 is considered very mild.  Her AHI in rem sleep exacerbated to 32.3/h.  There was no significant amount of REM sleep noted so she has not had REM restriction.  And looking at her sleep graph REM sleep in supine position was associated with oxygen desaturations at most frequent apnea REM sleep on the right side did produce 50% less apnea REM sleep later at night was associated with some more snoring.  So this may be some fatigability noticed.  I like for her to basically stay off her back so we will discuss the tennis ball method today and if the tennis ball does not work we may go to the [employer] sleep balance device she can obtain this was a medical prescription by a physician and is also a deductible from her health savings account. ADHD : She had been on atomoxetine , Strattera,  for compulsive eating.  Alexandria Ward  reports that she was finally tested for adult attention deficit disorder and was diagnosed with it.  She had  headaches with all of these medications:   04-24-2021: She started several stimulant medications at atypical stimulant medications.  She started on 10-25 with her first stimulant medication Vyvanse but was switched on 10.28 22 to methylphenidate.  Both medications triggered headaches so she could not tolerate it either.  Now off all ADHD medications, she has been headache free since 04-12-21. Sleep hygiene and sleep habits not changed, watching videos at night. Melatonin can be tried.  HST repeat to see if weight loss has reduced the AHI since 06-2019.   Sadly, she has not had success  with ADHD treatment,  Headaches were treated with Topiramate on 9-19, and on 9-26 we added Ajovy.  She had her second dose in October, and she sees a reduction in headache frequency and intensity.   She is overall  talkative, her leg edema is reduced, her sleepiness is improved. She lost 30 pounds since last year, with ups and downs.     Last visit : 02-14-2021 The patient now presents for migraine , after she tried weight loss assistance with generic wellbutrin- and developed migraines in response. Has had migraines in her twenties.  She has cluster headaches at night. Racing thoughts at night, causing insomnia. ADHD?  Dr Leafy Ro prescribed a medication and referred to attention specialist. Strattera.  She never got used to CPAP , and couldn't sleep well with it. She feels that apnea and migraines can't be closely related.  She slept through phone and alarms when she takes a sleep aid ( Ambien) .   So, we are meeting under the pretext of working on migraines, and not addressing apnea.  Migraine: she has no log- guessed it would be 4-5 a month, weather changes. Sleep deprivation. Caffeine 2 drinks in AM 1 tea at lunch,well hydrated. Takes allergy meds daily.  Can be lasting into another day- status migrainosus once a month. Not changed by [facility], not always working is ZOMIG low BP.   I like to start a  preventive.  Start with Topamax, not anything lowering BP.   Plan B) ajovy, emgality.   I had the pleasure of seeing her in consultation 1/ 12 /2021 and underwent 2 sleep studies since, diagnosed with mild OSA, REM dependent. When I first met Alexandria Ward in January she had a reported history was in 6 months prior to our appointment of vertigo and dizziness she had been seen by vestibular rehab and PT.  She developed ankle edema and swelling and she had reported quite a significant steady weight gain.   We discussed referral to healthy weight and wellness service of [place].  She enrolled and in the meantime has lost 29 pounds! Her insurance company did not permit [place] to do an attended sleep study also she had a history of parasomnia and vivid dreams.  She underwent a home sleep test on 10 August 2019. Mild obstructive sleep apnea was documented with an overall  Apnea- Hypopnea Index/ hour of sleep (AHI) of 9.2/h and strong  REM sleep dependency. The REM sleep AHI was 32.3/h, three times the overall AHI. There was also supine sleep position dependency.  Heart rate variability was within normal range and no clinically significant hypoxemia was noted. Moderately loud snoring was indicated by RDI.   Compliance : The user time for those exceptional nights were 3 hours and 41 minutes on average the AutoSet is set between a minimum pressure of 5 and a maximum pressure of 15 was 3 cm EPR and the residual AHI is 1.7 given that she had a mild apnea to begin with this is still a significant reduction in apnea counts.  No central apneas are noted and the air leak is moderate the 95th percentile pressure is only 7.9 cmH2O.  Also she wore a retainer and bite guard. She woke with migraines.  It may help if we are reducing the upper pressure window for the patient. She feels that it was too much pressure.       I have the pleasure of seeing Alexandria Ward today, a right -handed Caucasian female with a  possible sleep disorder.  She  has a past medical history of Anemia, Asthma, Back pain, Constipation, Depression, Dyspnea, Fatigue, Fluid retention, Hyperlipidemia, Hypothyroidism, IBS (irritable bowel syndrome), Joint pain, Lower extremity edema, Migraines, Palpitations, Shortness of breath, Tachycardia, Thyroid disease, Vitamin B 12 deficiency, and Vitamin D deficiency.. She is taking synthroid. But developed tachycardia, ankle edema.and hypotension with Vertigo on BP medication.  She gained 50 pounds , is menopausal. Was diagnosed with hypothyroidism 12 month ago.  She has seen Dr. [name] , who did a (negative) cortisol test.     Sleep relevant medical history:  Family medical /sleep history: maternal Aunt had a CPAP machine, passed away.   Social history:  Patient is working as  a Biomedical engineer - IT- and lives in a household alone. Family status is single , without children. Her mother lives across the street.  The patient currently works from home. Pets are present. 2 poodles. Tobacco use never .  ETOH use ; rare , Caffeine intake in form of Coffee( 1 mug in AM ) Soda( none ) Tea ( at lunch ) or energy drinks. Regular exercise- interrupted by Covid.    Sleep habits are as follows: Lunch is with her mom, generally the biggest meal. The patient's dinner time is between 8-8.30 PM. She eats a light dinner. She often falls asleep at her moms watching TV.  Returns to her house- has GERD- takes amitriptyline. She wears a mouth guard. She sis a mouth breather.  The patient goes to bed at 12- PM to 2 AM. She continues to sleep for 3 hours, wakes for 2 bathroom breaks, the first time at 4 AM.   The preferred sleep position is right lateral- , with the support of 2 pillows, the bed is flat- she prefers a recliner.  pillows. Dreams are reportedly infrequent.  7 AM is the usual rise time. The patient wakes up with several  Alarms  At 6.30-- 6.45 AM .   She reports not feeling refreshed or restored in AM, with  symptoms such as dry mouth, morning headaches,  and residual fatigue. Naps are taken  lasting several hours and are more refreshing than nocturnal sleep.    Review of Systems: Out of a complete 14 system review, the patient complains of only the following symptoms, and all other reviewed systems are negative.: 2/ 2021 :  Chief concern according to patient :  Non restorative sleep, weight gain and fatigue. She gained 50 pounds , is menopausal. Was diagnosed with hypothyroidism 12 month ago.   Fatigue, sleepiness , snoring, fragmented sleep, Insomnia - nocturia,dry mouth.   ADHD but intolerant to all medications, headaches   Headaches/ Migraines  better on ajovy. Scalp soreness.     How likely are you to doze in the following situations: 0 = not likely, 1 = slight chance, 2 = moderate chance, 3 = high chance   Sitting and Reading? Watching Television? Sitting inactive in a public place (theater or meeting)? As a passenger in a car for an hour without a break? Lying down in the afternoon when circumstances permit? Sitting and talking to someone? Sitting quietly after lunch without alcohol? In a car, while stopped for a few minutes in traffic?   Total = 8/ 24 points   FSS was not endorsed anew at 50/ 63 points.  GDS  : she loves her job, feels happy, not overworked.  Worried about her mother.   Social History   Socioeconomic History   Marital status: Single    Spouse name: Not on file   Number of children: 0   Years of education: Not on file   Highest education level: Not on file  Occupational History   Occupation: Market researcher  Tobacco Use   Smoking status: Never   Smokeless tobacco: Never  Vaping Use   Vaping Use: Never used  Substance and Sexual Activity   Alcohol use: Yes    Comment: rarely   Drug use: Never   Sexual activity: Not on file  Other Topics Concern   Not on file  Social History Narrative   Not on file   Social Determinants of Health    Financial Resource Strain: Not on file  Food Insecurity: Not on file  Transportation Needs: Not on file  Physical Activity: Not on file  Stress: Not on file  Social Connections: Not on file    Family History  Problem Relation Age of Onset   Restless legs syndrome Mother    Arthritis Mother    Neuropathy Mother    Hyperlipidemia Mother    Depression Mother    Heart attack Father     Past Medical History:  Diagnosis Date   Anemia    Asthma    Back pain    Constipation    Depression    Dyspnea    Fatigue    Fluid retention    Hyperlipidemia    Hypothyroidism    IBS (irritable bowel syndrome)    Joint pain    Lower extremity edema    Migraines    Palpitations    Shortness of breath    Tachycardia    Thyroid disease    Vitamin B 12 deficiency    Vitamin D deficiency     Past Surgical History:  Procedure Laterality Date   APPENDECTOMY     endometrial ablasion       Current Outpatient Medications on File Prior to Visit  Medication Sig Dispense Refill   Carboxymethylcellulose Sod PF 1 % GEL Place 1 application into both eyes as needed.     Digestive Enzymes (DIGESTIVE ENZYME PO) Take 500 mg by mouth.     fexofenadine (ALLEGRA) 180 MG tablet Take by mouth.     fluconazole (DIFLUCAN) 150 MG tablet Take 1 tablet by mouth as needed.     fluticasone (FLONASE) 50 MCG/ACT nasal spray Place into the nose.     Fremanezumab-vfrm (AJOVY) 225 MG/1.5ML SOAJ Inject 225 mg into the skin every 30 (thirty) days. 1.68 mL 11   magnesium gluconate (MAGONATE) 500 MG tablet Take 500 mg by mouth daily.     Multiple Vitamins-Minerals (HAIR/SKIN/NAILS/BIOTIN PO) Take 1 tablet by mouth daily.     omeprazole (PRILOSEC) 40 MG capsule Take 1 capsule (40 mg total) by mouth daily. 30 capsule 0   ondansetron (ZOFRAN ODT) 4 MG disintegrating tablet Take 1 tablet (4 mg total) by mouth every 8 (eight) hours as needed. 20 tablet 1   SUMAtriptan (IMITREX) 6 MG/0.5ML SOLN injection Inject 0.5 mLs  (6 mg total) into the skin every 2 (two) hours as needed for migraine or headache. May repeat in 2 hours if headache persists or recurs. 1 mL 3   thyroid (ARMOUR) 15 MG tablet Take 15 mg by mouth daily.     tiZANidine (ZANAFLEX) 4 MG tablet Take 1 tablet (4 mg total) by mouth every 6 (six) hours as needed. 30 tablet 1   UNABLE TO FIND Take 1 tablet by mouth daily. Med Name: Phytoceramides     Vitamin D, Ergocalciferol, (DRISDOL) 1.25 MG (50000 UNIT) CAPS capsule Take 1 capsule (50,000 Units total) by mouth every 7 (seven) days. 4 capsule 0   zolmitriptan (ZOMIG) 5 MG tablet Take 1 tablet (5 mg total) by mouth as needed for migraine. 10 tablet 1   No current facility-administered medications on file prior to visit.    Allergies  Allergen Reactions   Betadine [Povidone Iodine]    Azithromycin Rash   Ciprofloxacin Rash   Penicillins Rash    Cant remember the reaction or the severity of it   Tape Rash    Physical exam:  Today's Vitals   06/06/21 1428  BP: 100/66  Pulse: (!) 109  Weight: 186 lb 8 oz (84.6 kg)  Height: $Remove'5\' 1"'WJcTSUc$  (1.549 m)   Body mass index is 35.24 kg/m.   Wt Readings from Last 3 Encounters:  06/06/21 186 lb 8 oz (84.6 kg)  05/29/21 184 lb (83.5 kg)  05/01/21 182 lb (82.6 kg)     Ht Readings from Last 3 Encounters:  06/06/21 $RemoveB'5\' 1"'CCcnlqtq$  (1.549 m)  05/29/21 $RemoveB'5\' 1"'HYlEHUOf$  (1.549 m)  05/01/21 $RemoveB'5\' 1"'DMBhXBdX$  (1.549 m)      General: The patient is awake, alert and appears not in acute distress. The patient is well groomed. Head: Normocephalic, atraumatic. Neck is supple. Mallampati 4,  neck circumference: 16. 25  inches . Nasal airflow patent.  Retrognathia is not  seen.  Dental status: intact  Cardiovascular:  Regular rate and cardiac rhythm by pulse, without distended neck veins. Respiratory: Lungs are clear to auscultation.  Skin:  Evidence of ankle edema, but no rash. Trunk: The patient's posture is erect.   Neurologic exam : The patient is awake and alert, oriented to place and  time.      Cranial nerves: no loss of smell or taste reported  Pupils are equal and briskly reactive to light. Extraocular movements in vertical and horizontal planes were intact and without nystagmus. No Diplopia.  Facial motor strength is symmetric and tongue and uvula move midline.  Neck ROM : rotation, tilt and flexion extension were normal for age and shoulder shrug was symmetrical.    Motor exam:  Symmetric bulk, tone and ROM.   Normal tone  .   Sensory:  Normal. Proprioception tested in the upper extremities was normal.   Gait and station:  deferred.  Deep tendon reflexes: in the upper and lower extremities are symmetrically attenuated and intact.  Babinski response was deferred .      After spending a total time of 25 minutes face to face including additional time for physical and neurologic examination, review of laboratory studies,  personal review of imaging studies, reports and results of other testing and review of referral information / records as far as provided in visit, I have established the following assessments:  1)MILD but REM dependent Apnea.  I have discussed with Alexandria Ward what her home sleep test truly means she has mild obstructive sleep apnea, her apnea is only significant in rem sleep so it is REM dependent but still it is not severe.  Looking at the sleep architecture together, it is clear that supine REM sleep is prone to produce many apneas more than when she is nonsupine,  same goes for her to snore louder- it also will be associated with frequent short oxygen drops not sustained hypoxia.  Given that CPAP was very uncomfortable for her I like for her to just stay off her back.    We will try the tennis ball method as a home remedy if this should not work she can still rolled to or decide to go to a night balance device by [employer].   This is an electronic belt worn around the chest but will vibrate and remind her to take a different position at  sleep. [employer]ance [place] [employer] [id number]   2) migraine headaches improved after recently diagnosed condition ADHD medications were found to trigger her migraines, she d/c topiramate also for triggering migraines. Ajovy now well tolerated. No need for additional Botox.   3) ADHD  diagnosed but remains untreated.   I would like to thank Holland Commons, FNP , and Dr.  Dennard Nip, MD,   [facility] [address] [place],  [place] [address] for allowing me to meet with and to take care of this pleasant patient.   Follow up through our NP prn with NP in  12 months .   Electronically signed by: Larey Seat, MD 06/06/2021 2:42 PM  [facility] Board certified by The [employer] of Sleep Medicine and Diplomate of the [employer] of Sleep Medicine. Board certified In Neurology through the [place], Fellow of the [employer] of Neurology. Medical Director of [facility].

## 2021-06-21 ENCOUNTER — Other Ambulatory Visit (INDEPENDENT_AMBULATORY_CARE_PROVIDER_SITE_OTHER): Payer: Self-pay | Admitting: Family Medicine

## 2021-06-21 DIAGNOSIS — K2100 Gastro-esophageal reflux disease with esophagitis, without bleeding: Secondary | ICD-10-CM

## 2021-06-24 NOTE — Telephone Encounter (Signed)
LAST APPOINTMENT DATE: 05/29/21 NEXT APPOINTMENT DATE: 07/17/21   [id number] - [place], [place] - [address] AT CORNER OF [facility] [address] [place] [address] Phone: [phone] Fax: [phone]  Patient is requesting a refill of the following medications: Requested Prescriptions   Pending Prescriptions Disp Refills   omeprazole (PRILOSEC) 40 MG capsule [Pharmacy Med Name: OMEPRAZOLE DR 40 MG CAPSULE] 30 capsule 0    Sig: TAKE 1 CAPSULE (40 MG TOTAL) BY MOUTH DAILY.    Date last filled: 05/29/21 Previously prescribed by Dr. Dalbert Garnet  Lab Results  Component Value Date   HGBA1C 5.4 04/03/2021   HGBA1C 5.6 08/13/2020   HGBA1C 5.5 04/24/2020   Lab Results  Component Value Date   LDLCALC 121 (H) 04/03/2021   CREATININE 0.64 04/03/2021   Lab Results  Component Value Date   VD25OH 57.7 04/03/2021   VD25OH 50.5 08/13/2020   VD25OH 42.4 01/18/2020    BP Readings from Last 3 Encounters:  06/06/21 100/66  05/29/21 113/73  05/01/21 107/71

## 2021-06-25 ENCOUNTER — Ambulatory Visit (INDEPENDENT_AMBULATORY_CARE_PROVIDER_SITE_OTHER): Payer: 59 | Admitting: Family Medicine

## 2021-07-17 ENCOUNTER — Other Ambulatory Visit: Payer: Self-pay

## 2021-07-17 ENCOUNTER — Encounter (INDEPENDENT_AMBULATORY_CARE_PROVIDER_SITE_OTHER): Payer: Self-pay | Admitting: Family Medicine

## 2021-07-17 ENCOUNTER — Ambulatory Visit (INDEPENDENT_AMBULATORY_CARE_PROVIDER_SITE_OTHER): Payer: 59 | Admitting: Family Medicine

## 2021-07-17 VITALS — BP 109/73 | HR 89 | Temp 98.3°F | Ht 61.0 in | Wt 182.0 lb

## 2021-07-17 DIAGNOSIS — Z6834 Body mass index (BMI) 34.0-34.9, adult: Secondary | ICD-10-CM

## 2021-07-17 DIAGNOSIS — E559 Vitamin D deficiency, unspecified: Secondary | ICD-10-CM | POA: Diagnosis not present

## 2021-07-17 DIAGNOSIS — Z9189 Other specified personal risk factors, not elsewhere classified: Secondary | ICD-10-CM

## 2021-07-17 DIAGNOSIS — E669 Obesity, unspecified: Secondary | ICD-10-CM

## 2021-07-17 DIAGNOSIS — Z6838 Body mass index (BMI) 38.0-38.9, adult: Secondary | ICD-10-CM

## 2021-07-17 DIAGNOSIS — K2100 Gastro-esophageal reflux disease with esophagitis, without bleeding: Secondary | ICD-10-CM | POA: Diagnosis not present

## 2021-07-17 MED ORDER — VITAMIN D (ERGOCALCIFEROL) 1.25 MG (50000 UNIT) PO CAPS: 50000.0000 [IU] | ORAL_CAPSULE | ORAL | 0 refills | Status: DC

## 2021-07-17 MED ORDER — OMEPRAZOLE 40 MG PO CPDR: 40.0000 mg | DELAYED_RELEASE_CAPSULE | Freq: Every day | ORAL | 0 refills | Status: DC

## 2021-07-17 NOTE — Progress Notes (Signed)
Chief Complaint:   OBESITY Alexandria Ward is here to discuss her progress with her obesity treatment plan along with follow-up of her obesity related diagnoses. Alexandria Ward is on keeping a food journal and adhering to recommended goals of 1000-1200 calories and 75+ grams of protein daily and states she is following her eating plan approximately 30% of the time. Alexandria Ward states she is doing 0 minutes 0 times per week.  Today's visit was #: 27 Starting weight: 206 lbs Starting date: 07/14/2019 Today's weight: 182 lbs Today's date: 07/17/2021 Total lbs lost to date: 20 Total lbs lost since last in-office visit: 2  Interim History: Alexandria Ward did well with avoiding holiday weight gain over Christmas. She was mindful of her food choices and worked to increase her protein.  Subjective:   1. Gastroesophageal reflux disease with esophagitis, unspecified whether hemorrhage Alexandria Ward is doing well with diet and weight loss. Her symptoms are controlled on Prilosec.  2. Vitamin D deficiency Alexandria Ward's last labs are at goal. She denies nausea, vomiting, or muscle weakness.  3. At risk for impaired metabolic function Alexandria Ward is at increased risk for impaired metabolic function if protein is too low.  Assessment/Plan:   1. Gastroesophageal reflux disease with esophagitis, unspecified whether hemorrhage Intensive lifestyle modifications are the first line treatment for this issue. We discussed several lifestyle modifications today. We will refill Prilosec 40 mg daily #30 for 1 month. Alexandria Ward will continue to work on diet, exercise and weight loss efforts. Orders and follow up as documented in patient record.   2. Vitamin D deficiency Low Vitamin D level contributes to fatigue and are associated with obesity, breast, and colon cancer. We will refill prescription Vitamin D 50,000 IU every week #4 for 1 month. Alexandria Ward will follow-up for routine testing of Vitamin D, at least 2-3 times per year to avoid over-replacement.  3. At risk  for impaired metabolic function Alexandria Ward was given approximately 15 minutes of impaired  metabolic function prevention counseling today. We discussed intensive lifestyle modifications today with an emphasis on specific nutrition and exercise instructions and strategies.   Repetitive spaced learning was employed today to elicit superior memory formation and behavioral change.  4. Obesity with current BMI 34.5 Alexandria Ward is currently in the action stage of change. As such, her goal is to continue with weight loss efforts. She has agreed to keeping a food journal and adhering to recommended goals of 1000-1200 calories and 75+ grams of protein daily.   We will recheck fasting labs at her next office visit.  Behavioral modification strategies: increasing lean protein intake, meal planning and cooking strategies, and keeping a strict food journal.  Alexandria Ward has agreed to follow-up with our clinic in 4 weeks. She was informed of the importance of frequent follow-up visits to maximize her success with intensive lifestyle modifications for her multiple health conditions.   Objective:   Blood pressure 109/73, pulse 89, temperature 98.3 F (36.8 C), height 5\' 1"  (1.549 m), weight 182 lb (82.6 kg), SpO2 99 %. Body mass index is 34.39 kg/m.  General: Cooperative, alert, well developed, in no acute distress. HEENT: Conjunctivae and lids unremarkable. Cardiovascular: Regular rhythm.  Lungs: Normal work of breathing. Neurologic: No focal deficits.   Lab Results  Component Value Date   CREATININE 0.64 04/03/2021   BUN 14 04/03/2021   NA 143 04/03/2021   K 4.6 04/03/2021   CL 106 04/03/2021   CO2 21 04/03/2021   Lab Results  Component Value Date   ALT 16  04/03/2021   AST 16 04/03/2021   ALKPHOS 87 04/03/2021   BILITOT 0.2 04/03/2021   Lab Results  Component Value Date   HGBA1C 5.4 04/03/2021   HGBA1C 5.6 08/13/2020   HGBA1C 5.5 04/24/2020   HGBA1C 5.6 09/19/2019   HGBA1C 5.8 (H) 07/14/2019    Lab Results  Component Value Date   INSULIN 7.1 04/03/2021   INSULIN 6.8 08/13/2020   INSULIN 4.8 09/19/2019   INSULIN 12.3 07/14/2019   Lab Results  Component Value Date   TSH 2.41 04/24/2020   Lab Results  Component Value Date   CHOL 187 04/03/2021   HDL 54 04/03/2021   LDLCALC 121 (H) 04/03/2021   TRIG 63 04/03/2021   Lab Results  Component Value Date   VD25OH 57.7 04/03/2021   VD25OH 50.5 08/13/2020   VD25OH 42.4 01/18/2020   Lab Results  Component Value Date   WBC 6.1 04/24/2020   HGB 13.6 04/24/2020   HCT 42 04/24/2020   MCV 98 (H) 09/19/2019   PLT 297 04/24/2020   No results found for: IRON, TIBC, FERRITIN  Attestation Statements:   Reviewed by clinician on day of visit: allergies, medications, problem list, medical history, surgical history, family history, social history, and previous encounter notes.   I, Burt Knack, am acting as transcriptionist for Quillian Quince, MD.  I have reviewed the above documentation for accuracy and completeness, and I agree with the above. -  Quillian Quince, MD

## 2021-07-20 ENCOUNTER — Other Ambulatory Visit (INDEPENDENT_AMBULATORY_CARE_PROVIDER_SITE_OTHER): Payer: Self-pay | Admitting: Family Medicine

## 2021-07-20 DIAGNOSIS — K2100 Gastro-esophageal reflux disease with esophagitis, without bleeding: Secondary | ICD-10-CM

## 2021-08-13 ENCOUNTER — Encounter (INDEPENDENT_AMBULATORY_CARE_PROVIDER_SITE_OTHER): Payer: Self-pay | Admitting: Family Medicine

## 2021-08-13 ENCOUNTER — Ambulatory Visit (INDEPENDENT_AMBULATORY_CARE_PROVIDER_SITE_OTHER): Payer: 59 | Admitting: Family Medicine

## 2021-08-13 ENCOUNTER — Other Ambulatory Visit: Payer: Self-pay

## 2021-08-13 VITALS — BP 115/79 | HR 94 | Temp 98.7°F | Ht 61.0 in | Wt 183.0 lb

## 2021-08-13 DIAGNOSIS — E039 Hypothyroidism, unspecified: Secondary | ICD-10-CM | POA: Diagnosis not present

## 2021-08-13 DIAGNOSIS — Z9189 Other specified personal risk factors, not elsewhere classified: Secondary | ICD-10-CM

## 2021-08-13 DIAGNOSIS — Z6834 Body mass index (BMI) 34.0-34.9, adult: Secondary | ICD-10-CM

## 2021-08-13 DIAGNOSIS — E559 Vitamin D deficiency, unspecified: Secondary | ICD-10-CM

## 2021-08-13 DIAGNOSIS — K2100 Gastro-esophageal reflux disease with esophagitis, without bleeding: Secondary | ICD-10-CM

## 2021-08-13 DIAGNOSIS — E669 Obesity, unspecified: Secondary | ICD-10-CM

## 2021-08-13 DIAGNOSIS — R7303 Prediabetes: Secondary | ICD-10-CM | POA: Diagnosis not present

## 2021-08-13 DIAGNOSIS — E66812 Obesity, class 2: Secondary | ICD-10-CM

## 2021-08-13 MED ORDER — OMEPRAZOLE 40 MG PO CPDR: 40.0000 mg | DELAYED_RELEASE_CAPSULE | Freq: Every day | ORAL | 0 refills | Status: DC

## 2021-08-13 MED ORDER — VITAMIN D (ERGOCALCIFEROL) 1.25 MG (50000 UNIT) PO CAPS: 50000.0000 [IU] | ORAL_CAPSULE | ORAL | 0 refills | Status: DC

## 2021-08-14 LAB — CMP14+EGFR
ALT: 18 IU/L (ref 0–32)
AST: 21 IU/L (ref 0–40)
Albumin/Globulin Ratio: 1.9 (ref 1.2–2.2)
Albumin: 4.1 g/dL (ref 3.8–4.9)
Alkaline Phosphatase: 82 IU/L (ref 44–121)
BUN/Creatinine Ratio: 20 (ref 9–23)
BUN: 14 mg/dL (ref 6–24)
Bilirubin Total: 0.3 mg/dL (ref 0.0–1.2)
CO2: 24 mmol/L (ref 20–29)
Calcium: 9.8 mg/dL (ref 8.7–10.2)
Chloride: 105 mmol/L (ref 96–106)
Creatinine, Ser: 0.69 mg/dL (ref 0.57–1.00)
Globulin, Total: 2.2 g/dL (ref 1.5–4.5)
Glucose: 86 mg/dL (ref 70–99)
Potassium: 4.8 mmol/L (ref 3.5–5.2)
Sodium: 143 mmol/L (ref 134–144)
Total Protein: 6.3 g/dL (ref 6.0–8.5)
eGFR: 101 mL/min/{1.73_m2} (ref >59)

## 2021-08-14 LAB — VITAMIN D 25 HYDROXY (VIT D DEFICIENCY, FRACTURES): Vit D, 25-Hydroxy: 82.3 ng/mL (ref 30.0–100.0)

## 2021-08-14 LAB — TSH: TSH: 2.89 u[IU]/mL (ref 0.450–4.500)

## 2021-08-14 LAB — INSULIN, RANDOM: INSULIN: 4.4 u[IU]/mL (ref 2.6–24.9)

## 2021-08-14 LAB — HEMOGLOBIN A1C
Est. average glucose Bld gHb Est-mCnc: 111 mg/dL
Hgb A1c MFr Bld: 5.5 % (ref 4.8–5.6)

## 2021-08-14 NOTE — Progress Notes (Signed)
Chief Complaint:   OBESITY Alexandria Ward is here to discuss her progress with her obesity treatment plan along with follow-up of her obesity related diagnoses. Alexandria Ward is on keeping a food journal and adhering to recommended goals of 1000-1200 calories and 75+ grams of protein daily and states she is following her eating plan approximately 70% of the time. [name] states she is doing 0 minutes 0 times per week.  Today's visit was #: 28 Starting weight: 206 lbs Starting date: 07/14/2019 Today's weight: 183 lbs Today's date: 08/13/2021 Total lbs lost to date: 23 Total lbs lost since last in-office visit: 0  Interim History: Alexandria Ward is focusing more on journaling over the past week. She is aiming to get protein in with each meal. She doesn't feel she drink enough work. She is helping to cook for her mother who lives across the street from her.  Subjective:   1. Pre-diabetes [name]'s last A1c looked better at 5.4 on 04/03/2021.  2. Vitamin D deficiency Alexandria Ward needs a refill. She is taking Vit D 50,000 IU weekly, and she denies side effects. She denies nausea, vomiting, or muscle weakness.   3. Gastroesophageal reflux disease with esophagitis, unspecified whether hemorrhage Alexandria Ward is taking Prilosec and doing well, and she denies side effects.   4. Hypothyroidism (acquired) Alexandria Ward is taking Armour thyroid 15 mg was prescribed by her primary care provider. She denies side effects.   5. At risk for diabetes mellitus Alexandria Ward is at higher than average risk for developing diabetes due to her obesity.  Assessment/Plan:   1. Pre-diabetes Labs will be obtained today. [name] will continue working on dietary changes, exercise, and weight loss to help decrease the risk of diabetes.   - Hemoglobin A1c - Insulin, random - CMP14+EGFR  2. Vitamin D deficiency Labs will be obtained today. We will refill prescription Vitamin D for 1 month. [name] will follow-up for routine testing of Vitamin D, at least 2-3  times per year to avoid over-replacement.  - Vitamin D, Ergocalciferol, (DRISDOL) 1.25 MG (50000 UNIT) CAPS capsule; Take 1 capsule (50,000 Units total) by mouth every 7 (seven) days.  Dispense: 4 capsule; Refill: 0 - VITAMIN D 25 Hydroxy (Vit-D Deficiency, Fractures)  3. Gastroesophageal reflux disease with esophagitis, unspecified whether hemorrhage Intensive lifestyle modifications are the first line treatment for this issue. We discussed several lifestyle modifications today. We will refill Prilosec for 1 month. [name] will continue to work on diet, exercise and weight loss efforts. Orders and follow up as documented in patient record.   - omeprazole (PRILOSEC) 40 MG capsule; Take 1 capsule (40 mg total) by mouth daily.  Dispense: 30 capsule; Refill: 0  4. Hypothyroidism (acquired) Labs will be obtained today. [name] will continue following up with her primary care provider, and continue her medications as directed. Orders and follow up as documented in patient record.  - TSH  5. At risk for diabetes mellitus [name] was given approximately 15 minutes of diabetic education and counseling today. We discussed intensive lifestyle modifications today with an emphasis on weight loss as well as increasing exercise and decreasing simple carbohydrates in her diet. We also reviewed medication options with an emphasis on risk versus benefits of those discussed.  Repetitive spaced learning was employed today to elicit superior memory formation and behavioral change.   6. Obesity with current BMI 34.6 Alexandria Ward is currently in the action stage of change. As such, her goal is to continue with weight loss efforts. She has agreed to following  a lower carbohydrate, vegetable and lean protein rich diet plan.   Low Carb plan was given today. Discussed adding in exercising today.  Exercise goals: Start adding in some exercise.   Behavioral modification strategies: increasing water intake, no skipping meals, and  meal planning and cooking strategies.  Alexandria Ward has agreed to follow-up with our clinic in 3 to 4 weeks. She was informed of the importance of frequent follow-up visits to maximize her success with intensive lifestyle modifications for her multiple health conditions.   Alexandria Ward was informed we would discuss her lab results at her next visit unless there is a critical issue that needs to be addressed sooner. Alexandria Ward agreed to keep her next visit at the agreed upon time to discuss these results.  Objective:   Blood pressure 115/79, pulse 94, temperature 98.7 F (37.1 C), height $RemoveBe'5\' 1"'hTdlNqqgC$  (1.549 m), weight 183 lb (83 kg), SpO2 98 %. Body mass index is 34.58 kg/m.  General: Cooperative, alert, well developed, in no acute distress. HEENT: Conjunctivae and lids unremarkable. Cardiovascular: Regular rhythm.  Lungs: Normal work of breathing. Neurologic: No focal deficits.   Lab Results  Component Value Date   CREATININE 0.64 04/03/2021   BUN 14 04/03/2021   NA 143 04/03/2021   K 4.6 04/03/2021   CL 106 04/03/2021   CO2 21 04/03/2021   Lab Results  Component Value Date   ALT 16 04/03/2021   AST 16 04/03/2021   ALKPHOS 87 04/03/2021   BILITOT 0.2 04/03/2021   Lab Results  Component Value Date   HGBA1C 5.4 04/03/2021   HGBA1C 5.6 08/13/2020   HGBA1C 5.5 04/24/2020   HGBA1C 5.6 09/19/2019   HGBA1C 5.8 (H) 07/14/2019   Lab Results  Component Value Date   INSULIN 7.1 04/03/2021   INSULIN 6.8 08/13/2020   INSULIN 4.8 09/19/2019   INSULIN 12.3 07/14/2019   Lab Results  Component Value Date   TSH 2.41 04/24/2020   Lab Results  Component Value Date   CHOL 187 04/03/2021   HDL 54 04/03/2021   LDLCALC 121 (H) 04/03/2021   TRIG 63 04/03/2021   Lab Results  Component Value Date   VD25OH 57.7 04/03/2021   VD25OH 50.5 08/13/2020   VD25OH 42.4 01/18/2020   Lab Results  Component Value Date   WBC 6.1 04/24/2020   HGB 13.6 04/24/2020   HCT 42 04/24/2020   MCV 98 (H) 09/19/2019   PLT  297 04/24/2020   No results found for: IRON, TIBC, FERRITIN  Attestation Statements:   Reviewed by clinician on day of visit: allergies, medications, problem list, medical history, surgical history, family history, social history, and previous encounter notes.   I, Trixie Dredge, am acting as transcriptionist for Dennard Nip, MD.  I have reviewed the above documentation for accuracy and completeness, and I agree with the above. -  Dennard Nip, MD

## 2021-08-19 ENCOUNTER — Other Ambulatory Visit (INDEPENDENT_AMBULATORY_CARE_PROVIDER_SITE_OTHER): Payer: Self-pay | Admitting: Family Medicine

## 2021-08-19 DIAGNOSIS — K2100 Gastro-esophageal reflux disease with esophagitis, without bleeding: Secondary | ICD-10-CM

## 2021-09-10 ENCOUNTER — Other Ambulatory Visit: Payer: Self-pay

## 2021-09-10 ENCOUNTER — Encounter (INDEPENDENT_AMBULATORY_CARE_PROVIDER_SITE_OTHER): Payer: Self-pay | Admitting: Family Medicine

## 2021-09-10 ENCOUNTER — Ambulatory Visit (INDEPENDENT_AMBULATORY_CARE_PROVIDER_SITE_OTHER): Payer: 59 | Admitting: Family Medicine

## 2021-09-10 VITALS — BP 102/66 | HR 68 | Temp 98.1°F | Ht 61.0 in | Wt 181.0 lb

## 2021-09-10 DIAGNOSIS — K2100 Gastro-esophageal reflux disease with esophagitis, without bleeding: Secondary | ICD-10-CM

## 2021-09-10 DIAGNOSIS — E559 Vitamin D deficiency, unspecified: Secondary | ICD-10-CM

## 2021-09-10 DIAGNOSIS — E669 Obesity, unspecified: Secondary | ICD-10-CM | POA: Diagnosis not present

## 2021-09-10 DIAGNOSIS — Z6834 Body mass index (BMI) 34.0-34.9, adult: Secondary | ICD-10-CM

## 2021-09-10 DIAGNOSIS — Z9189 Other specified personal risk factors, not elsewhere classified: Secondary | ICD-10-CM

## 2021-09-10 MED ORDER — VITAMIN D (ERGOCALCIFEROL) 1.25 MG (50000 UNIT) PO CAPS: 50000.0000 [IU] | ORAL_CAPSULE | ORAL | 0 refills | Status: DC

## 2021-09-10 MED ORDER — OMEPRAZOLE 40 MG PO CPDR: 40.0000 mg | DELAYED_RELEASE_CAPSULE | Freq: Every day | ORAL | 0 refills | Status: DC

## 2021-09-11 NOTE — Progress Notes (Signed)
? ? ? ?Chief Complaint:  ? ?OBESITY ?Alexandria Ward is here to discuss her progress with her obesity treatment plan along with follow-up of her obesity related diagnoses. Alexandria Ward is on following a lower carbohydrate, vegetable and lean protein rich diet plan and states she is following her eating plan approximately 90% of the time. Alexandria Ward states she is doing 0 minutes 0 times per week. ? ?Today's visit was #: 32 ?Starting weight: 206 lbs ?Starting date: 07/14/2019 ?Today's weight: 181 lbs ?Today's date: 09/10/2021 ?Total lbs lost to date: 25 ?Total lbs lost since last in-office visit: 2 ? ?Interim History: Alexandria Ward continues to do well with weight loss. Her hunger is controlled and she is working on diet and weight loss. She is doing well with increasing her protein and decreasing simple carbohydrates. ? ?Subjective:  ? ?1. Gastroesophageal reflux disease with esophagitis, unspecified whether hemorrhage ?Alexandria Ward continues to well with weight loss and with Prilosec to help with GERD. She notes her symptoms are still worse with bending over. ? ?2. Vitamin D deficiency ?Alexandria Ward is stable on Vit D. She is at goal but also at risk of over-replacement. ? ?3. At risk for heart disease ?Alexandria Ward is at higher than average risk for cardiovascular disease due to obesity. ? ?Assessment/Plan:  ? ?1. Gastroesophageal reflux disease with esophagitis, unspecified whether hemorrhage ?We will refill Prilosec for 1 month. Carleisha will continue with diet and weight loss. ? ?- omeprazole (PRILOSEC) 40 MG capsule; Take 1 capsule (40 mg total) by mouth daily.  Dispense: 30 capsule; Refill: 0 ? ?2. Vitamin D deficiency ?We will refill prescription Vitamin D for 1 month, but Alexandria Ward is to take her dose once every 2 weeks. ? ?- Vitamin D, Ergocalciferol, (DRISDOL) 1.25 MG (50000 UNIT) CAPS capsule; Take 1 capsule (50,000 Units total) by mouth every 7 (seven) days.  Dispense: 4 capsule; Refill: 0 ? ?3. At risk for heart disease ?Alexandria Ward was given approximately 15 minutes  of coronary artery disease prevention counseling today. She is 56 y.o. female and has risk factors for heart disease including obesity. We discussed intensive lifestyle modifications today with an emphasis on specific weight loss instructions and strategies. ? ?Repetitive spaced learning was employed today to elicit superior memory formation and behavioral change.  ? ?4. Obesity with current BMI 34.3 ?Alexandria Ward is currently in the action stage of change. As such, her goal is to continue with weight loss efforts. She has agreed to keeping a food journal and adhering to recommended goals of 1000-1200 calories and 75+ grams of protein daily. ? ?Behavioral modification strategies: increasing lean protein intake. ? ?Alexandria Ward has agreed to follow-up with our clinic in 4 weeks. She was informed of the importance of frequent follow-up visits to maximize her success with intensive lifestyle modifications for her multiple health conditions.  ? ?Objective:  ? ?Blood pressure 102/66, pulse 68, temperature 98.1 ?F (36.7 ?C), height 5\' 1"  (1.549 m), weight 181 lb (82.1 kg), SpO2 98 %. ?Body mass index is 34.2 kg/m?. ? ?General: Cooperative, alert, well developed, in no acute distress. ?HEENT: Conjunctivae and lids unremarkable. ?Cardiovascular: Regular rhythm.  ?Lungs: Normal work of breathing. ?Neurologic: No focal deficits.  ? ?Lab Results  ?Component Value Date  ? CREATININE 0.69 08/13/2021  ? BUN 14 08/13/2021  ? NA 143 08/13/2021  ? K 4.8 08/13/2021  ? CL 105 08/13/2021  ? CO2 24 08/13/2021  ? ?Lab Results  ?Component Value Date  ? ALT 18 08/13/2021  ? AST 21 08/13/2021  ? ALKPHOS 82  08/13/2021  ? BILITOT 0.3 08/13/2021  ? ?Lab Results  ?Component Value Date  ? HGBA1C 5.5 08/13/2021  ? HGBA1C 5.4 04/03/2021  ? HGBA1C 5.6 08/13/2020  ? HGBA1C 5.5 04/24/2020  ? HGBA1C 5.6 09/19/2019  ? ?Lab Results  ?Component Value Date  ? INSULIN 4.4 08/13/2021  ? INSULIN 7.1 04/03/2021  ? INSULIN 6.8 08/13/2020  ? INSULIN 4.8 09/19/2019  ? INSULIN  12.3 07/14/2019  ? ?Lab Results  ?Component Value Date  ? TSH 2.890 08/13/2021  ? ?Lab Results  ?Component Value Date  ? CHOL 187 04/03/2021  ? HDL 54 04/03/2021  ? LDLCALC 121 (H) 04/03/2021  ? TRIG 63 04/03/2021  ? ?Lab Results  ?Component Value Date  ? VD25OH 82.3 08/13/2021  ? VD25OH 57.7 04/03/2021  ? VD25OH 50.5 08/13/2020  ? ?Lab Results  ?Component Value Date  ? WBC 6.1 04/24/2020  ? HGB 13.6 04/24/2020  ? HCT 42 04/24/2020  ? MCV 98 (H) 09/19/2019  ? PLT 297 04/24/2020  ? ?No results found for: IRON, TIBC, FERRITIN ? ?Attestation Statements:  ? ?Reviewed by clinician on day of visit: allergies, medications, problem list, medical history, surgical history, family history, social history, and previous encounter notes. ? ? ?I, Trixie Dredge, am acting as transcriptionist for Dennard Nip, MD. ? ?I have reviewed the above documentation for accuracy and completeness, and I agree with the above. -  Dennard Nip, MD ? ? ?

## 2021-09-13 ENCOUNTER — Other Ambulatory Visit (INDEPENDENT_AMBULATORY_CARE_PROVIDER_SITE_OTHER): Payer: Self-pay | Admitting: Family Medicine

## 2021-09-13 DIAGNOSIS — K2100 Gastro-esophageal reflux disease with esophagitis, without bleeding: Secondary | ICD-10-CM

## 2021-10-01 ENCOUNTER — Encounter (INDEPENDENT_AMBULATORY_CARE_PROVIDER_SITE_OTHER): Payer: Self-pay | Admitting: Family Medicine

## 2021-10-01 ENCOUNTER — Ambulatory Visit (INDEPENDENT_AMBULATORY_CARE_PROVIDER_SITE_OTHER): Payer: 59 | Admitting: Family Medicine

## 2021-10-01 VITALS — BP 126/80 | Ht 61.0 in | Wt 183.0 lb

## 2021-10-01 DIAGNOSIS — E669 Obesity, unspecified: Secondary | ICD-10-CM

## 2021-10-01 DIAGNOSIS — K219 Gastro-esophageal reflux disease without esophagitis: Secondary | ICD-10-CM

## 2021-10-01 DIAGNOSIS — F439 Reaction to severe stress, unspecified: Secondary | ICD-10-CM | POA: Diagnosis not present

## 2021-10-01 DIAGNOSIS — Z6834 Body mass index (BMI) 34.0-34.9, adult: Secondary | ICD-10-CM | POA: Diagnosis not present

## 2021-10-01 MED ORDER — OMEPRAZOLE 40 MG PO CPDR: 40.0000 mg | DELAYED_RELEASE_CAPSULE | Freq: Every day | ORAL | 0 refills | Status: DC

## 2021-10-01 MED ORDER — ESCITALOPRAM OXALATE 10 MG PO TABS: 10.0000 mg | ORAL_TABLET | Freq: Every day | ORAL | 0 refills | Status: DC

## 2021-10-03 LAB — HEPATIC FUNCTION PANEL
ALT: 12 U/L (ref 7–35)
AST: 16 (ref 13–35)
Bilirubin, Total: 0.3

## 2021-10-03 LAB — CBC AND DIFFERENTIAL
A1c: 5.5
EGFR: 99
HCT: 38 (ref 36–46)
Hemoglobin: 12.7 (ref 12.0–16.0)
WBC: 5.2

## 2021-10-03 LAB — BASIC METABOLIC PANEL
CO2: 25 — AB (ref 13–22)
Chloride: 105 (ref 99–108)
Creatinine: 0.7 (ref 0.5–1.1)
Glucose: 88
Potassium: 4.5 mEq/L (ref 3.5–5.1)
Sodium: 140 (ref 137–147)

## 2021-10-03 LAB — VITAMIN D 25 HYDROXY (VIT D DEFICIENCY, FRACTURES): Vit D, 25-Hydroxy: 41.1

## 2021-10-03 LAB — LIPID PANEL
Cholesterol: 185 (ref 0–200)
HDL: 44 (ref 35–70)
LDL Cholesterol: 126
Triglycerides: 75 (ref 40–160)

## 2021-10-03 LAB — CBC: RBC: 3.93 (ref 3.87–5.11)

## 2021-10-11 ENCOUNTER — Other Ambulatory Visit (INDEPENDENT_AMBULATORY_CARE_PROVIDER_SITE_OTHER): Payer: Self-pay | Admitting: Family Medicine

## 2021-10-11 DIAGNOSIS — K219 Gastro-esophageal reflux disease without esophagitis: Secondary | ICD-10-CM

## 2021-10-14 NOTE — Progress Notes (Signed)
? ? ? ?Chief Complaint:  ? ?OBESITY ?Alexandria Ward is here to discuss her progress with her obesity treatment plan along with follow-up of her obesity related diagnoses. Alexandria Ward is on keeping a food journal and adhering to recommended goals of 1000-1200 calories and 75+ grams of protein daily and states she is following her eating plan approximately 50% of the time. Alexandria Ward states she is doing 0 minutes 0 times per week. ? ?Today's visit was #: 30 ?Starting weight: 206 lbs ?Starting date: 07/14/2019 ?Today's weight: 183 lbs ?Today's date: 10/01/2021 ?Total lbs lost to date: 37 ?Total lbs lost since last in-office visit: 0 ? ?Interim History: Alexandria Ward has struggled to stay on track with her eating plan. She is feeling overwhelmed with additional responsibilities and has increase has increase some comfort eating. She is up 2 lbs but this appears to be due to water weight.  ? ?Subjective:  ? ?1. Stress ?Alexandria Ward notes increase stress from multiple situations. She notes increased emotional eating behaviors. She cannot take Wellbutrin. ? ?2. Gastroesophageal reflux disease, unspecified whether esophagitis present ?Alexandria Ward stable on her medications, and she requests a refill. ? ?Assessment/Plan:  ? ?1. Stress ?Alexandria Ward agreed to start Lexapro 10 mg daily with no refills. We will follow up at her next visit.  ? ?- escitalopram (LEXAPRO) 10 MG tablet; Take 1 tablet (10 mg total) by mouth daily.  Dispense: 30 tablet; Refill: 0 ? ?2. Gastroesophageal reflux disease, unspecified whether esophagitis present ?We will refill Prilosec for 1 month. Alexandria Ward will continue to work on diet, exercise and weight loss efforts. Orders and follow up as documented in patient record.  ? ?- omeprazole (PRILOSEC) 40 MG capsule; Take 1 capsule (40 mg total) by mouth daily.  Dispense: 30 capsule; Refill: 0 ? ?3. Obesity with current BMI of 34.6 ?Alexandria Ward is currently in the action stage of change. As such, her goal is to continue with weight loss efforts. She has agreed to  the Category 1 Plan or keeping a food journal and adhering to recommended goals of 1000-1200 calories and 75+ grams of protein daily.  ? ?Behavioral modification strategies: increasing lean protein intake, ways to avoid night time snacking, and better snacking choices. ? ?Alexandria Ward has agreed to follow-up with our clinic in 4 weeks. She was informed of the importance of frequent follow-up visits to maximize her success with intensive lifestyle modifications for her multiple health conditions.  ? ?Objective:  ? ?Blood pressure 126/80, height 5\' 1"  (1.549 m), weight 183 lb (83 kg). ?Body mass index is 34.58 kg/m?. ? ?General: Cooperative, alert, well developed, in no acute distress. ?HEENT: Conjunctivae and lids unremarkable. ?Cardiovascular: Regular rhythm.  ?Lungs: Normal work of breathing. ?Neurologic: No focal deficits.  ? ?Lab Results  ?Component Value Date  ? CREATININE 0.69 08/13/2021  ? BUN 14 08/13/2021  ? NA 143 08/13/2021  ? K 4.8 08/13/2021  ? CL 105 08/13/2021  ? CO2 24 08/13/2021  ? ?Lab Results  ?Component Value Date  ? ALT 18 08/13/2021  ? AST 21 08/13/2021  ? ALKPHOS 82 08/13/2021  ? BILITOT 0.3 08/13/2021  ? ?Lab Results  ?Component Value Date  ? HGBA1C 5.5 08/13/2021  ? HGBA1C 5.4 04/03/2021  ? HGBA1C 5.6 08/13/2020  ? HGBA1C 5.5 04/24/2020  ? HGBA1C 5.6 09/19/2019  ? ?Lab Results  ?Component Value Date  ? INSULIN 4.4 08/13/2021  ? INSULIN 7.1 04/03/2021  ? INSULIN 6.8 08/13/2020  ? INSULIN 4.8 09/19/2019  ? INSULIN 12.3 07/14/2019  ? ?Lab Results  ?  Component Value Date  ? TSH 2.890 08/13/2021  ? ?Lab Results  ?Component Value Date  ? CHOL 187 04/03/2021  ? HDL 54 04/03/2021  ? LDLCALC 121 (H) 04/03/2021  ? TRIG 63 04/03/2021  ? ?Lab Results  ?Component Value Date  ? VD25OH 82.3 08/13/2021  ? VD25OH 57.7 04/03/2021  ? VD25OH 50.5 08/13/2020  ? ?Lab Results  ?Component Value Date  ? WBC 6.1 04/24/2020  ? HGB 13.6 04/24/2020  ? HCT 42 04/24/2020  ? MCV 98 (H) 09/19/2019  ? PLT 297 04/24/2020  ? ?No  results found for: IRON, TIBC, FERRITIN ? ?Attestation Statements:  ? ?Reviewed by clinician on day of visit: allergies, medications, problem list, medical history, surgical history, family history, social history, and previous encounter notes. ? ? ?I, Burt Knack, am acting as transcriptionist for Quillian Quince, MD. ? ?I have reviewed the above documentation for accuracy and completeness, and I agree with the above. -  Quillian Quince, MD ? ? ?

## 2021-10-23 ENCOUNTER — Other Ambulatory Visit (INDEPENDENT_AMBULATORY_CARE_PROVIDER_SITE_OTHER): Payer: Self-pay | Admitting: Family Medicine

## 2021-10-23 DIAGNOSIS — F439 Reaction to severe stress, unspecified: Secondary | ICD-10-CM

## 2021-10-29 ENCOUNTER — Ambulatory Visit (INDEPENDENT_AMBULATORY_CARE_PROVIDER_SITE_OTHER): Payer: 59 | Admitting: Family Medicine

## 2021-11-01 ENCOUNTER — Other Ambulatory Visit (INDEPENDENT_AMBULATORY_CARE_PROVIDER_SITE_OTHER): Payer: Self-pay | Admitting: Family Medicine

## 2021-11-01 DIAGNOSIS — F439 Reaction to severe stress, unspecified: Secondary | ICD-10-CM

## 2021-11-04 NOTE — Telephone Encounter (Signed)
LAST APPOINTMENT DATE: 10/01/2021 with Dr Dalbert Garnet  CANCELLED 10/29/2021 APPOINTMENT NEXT APPOINTMENT DATE: 11/26/2021 with Dr. Dalbert Garnet   [facility] [phone] - [place] - [address] AT [address] OF [facility] [address] [place] [address] Phone: [phone] Fax: [phone]  Patient is requesting a refill of the following medications: Requested Prescriptions   Pending Prescriptions Disp Refills   escitalopram (LEXAPRO) 10 MG tablet [Pharmacy Med Name: ESCITALOPRAM 10 MG TABLET] 30 tablet 0    Sig: TAKE 1 TABLET BY MOUTH EVERY DAY    Date last filled: 04/8/20236  Previously prescribed by [facility]  Lab Results  Component Value Date   HGBA1C 5.5 08/13/2021   HGBA1C 5.4 04/03/2021   HGBA1C 5.6 08/13/2020   Lab Results  Component Value Date   LDLCALC 121 (H) 04/03/2021   CREATININE 0.69 08/13/2021   Lab Results  Component Value Date   VD25OH 82.3 08/13/2021   VD25OH 57.7 04/03/2021   VD25OH 50.5 08/13/2020    BP Readings from Last 3 Encounters:  10/01/21 126/80  09/10/21 102/66  08/13/21 115/79  .

## 2021-11-06 ENCOUNTER — Other Ambulatory Visit (INDEPENDENT_AMBULATORY_CARE_PROVIDER_SITE_OTHER): Payer: Self-pay | Admitting: Family Medicine

## 2021-11-06 DIAGNOSIS — K219 Gastro-esophageal reflux disease without esophagitis: Secondary | ICD-10-CM

## 2021-11-26 ENCOUNTER — Encounter (INDEPENDENT_AMBULATORY_CARE_PROVIDER_SITE_OTHER): Payer: Self-pay | Admitting: Family Medicine

## 2021-11-26 ENCOUNTER — Ambulatory Visit (INDEPENDENT_AMBULATORY_CARE_PROVIDER_SITE_OTHER): Payer: 59 | Admitting: Family Medicine

## 2021-11-26 VITALS — BP 96/58 | HR 72 | Temp 97.9°F | Ht 61.0 in | Wt 184.0 lb

## 2021-11-26 DIAGNOSIS — Z6834 Body mass index (BMI) 34.0-34.9, adult: Secondary | ICD-10-CM | POA: Diagnosis not present

## 2021-11-26 DIAGNOSIS — K219 Gastro-esophageal reflux disease without esophagitis: Secondary | ICD-10-CM

## 2021-11-26 DIAGNOSIS — F3289 Other specified depressive episodes: Secondary | ICD-10-CM | POA: Diagnosis not present

## 2021-11-26 DIAGNOSIS — E669 Obesity, unspecified: Secondary | ICD-10-CM | POA: Diagnosis not present

## 2021-11-26 DIAGNOSIS — Z9189 Other specified personal risk factors, not elsewhere classified: Secondary | ICD-10-CM

## 2021-11-26 MED ORDER — OMEPRAZOLE 40 MG PO CPDR: 40.0000 mg | DELAYED_RELEASE_CAPSULE | Freq: Every day | ORAL | 0 refills | Status: DC

## 2021-11-26 MED ORDER — ESCITALOPRAM OXALATE 10 MG PO TABS: 10.0000 mg | ORAL_TABLET | Freq: Every day | ORAL | 0 refills | Status: DC

## 2021-11-26 NOTE — Progress Notes (Unsigned)
Chief Complaint:   OBESITY Alexandria Ward is here to discuss her progress with her obesity treatment plan along with follow-up of her obesity related diagnoses. Alexandria Ward is on the Category 1 Plan or keeping a food journal and adhering to recommended goals of 1000-1200 calories and 75+ grams of protein daily and states she is following her eating plan approximately 0% of the time. Alexandria Ward states she is doing 0 minutes 0 times per week.  Today's visit was #: 31 Starting weight: 206 lbs Starting date: 07/14/2019 Today's weight: 184 lbs Today's date: 11/26/2021 Total lbs lost to date: 22 Total lbs lost since last in-office visit: 0  Interim History: Alexandria Ward has done well with maintaining her weight since her last visit.  She was unable to journal, but she tried to portion control and make smarter choices.  Subjective:   1. Gastroesophageal reflux disease, unspecified whether esophagitis present Alexandria Ward is stable on Prilosec, and she is working on her weight loss to help with her symptoms.  2. Other depression with emotional eating Alexandria Ward continues to work on decreasing her sugar cravings.  3. At risk for heart disease Alexandria Ward is at higher than average risk for cardiovascular disease due to obesity.  Assessment/Plan:   1. Gastroesophageal reflux disease, unspecified whether esophagitis present Intensive lifestyle modifications are the first line treatment for this issue. We discussed several lifestyle modifications today.  We will refill Prilosec for 1 month.  Alexandria Ward will continue to work on diet, exercise and weight loss efforts. Orders and follow up as documented in patient record.   - omeprazole (PRILOSEC) 40 MG capsule; Take 1 capsule (40 mg total) by mouth daily.  Dispense: 30 capsule; Refill: 0  2. Other depression with emotional eating We will refill Lexapro for 1 month.  Behavior modification techniques were discussed today to help Alexandria Ward deal with her emotional/non-hunger eating behaviors.   Orders and follow up as documented in patient record.   - escitalopram (LEXAPRO) 10 MG tablet; Take 1 tablet (10 mg total) by mouth daily.  Dispense: 30 tablet; Refill: 0  3. At risk for heart disease Alexandria Ward was given approximately 15 minutes of coronary artery disease prevention counseling today. She is 56 y.o. female and has risk factors for heart disease including obesity. We discussed intensive lifestyle modifications today with an emphasis on specific weight loss instructions and strategies.  Repetitive spaced learning was employed today to elicit superior memory formation and behavioral change.   4. Obesity, Current BMI 34.9 Alexandria Ward is currently in the action stage of change. As such, her goal is to continue with weight loss efforts. She has agreed to change to the Category 2 Plan.   Alexandria Ward was advised to lose weight, she needs to follow a structured plan. She will work on finding which will fit her best.   Behavioral modification strategies: increasing lean protein intake.  Alexandria Ward has agreed to follow-up with our clinic in 4 weeks. She was informed of the importance of frequent follow-up visits to maximize her success with intensive lifestyle modifications for her multiple health conditions.   Objective:   Blood pressure (!) 96/58, pulse 72, temperature 97.9 F (36.6 C), height 5\' 1"  (1.549 m), weight 184 lb (83.5 kg), SpO2 98 %. Body mass index is 34.77 kg/m.  General: Cooperative, alert, well developed, in no acute distress. HEENT: Conjunctivae and lids unremarkable. Cardiovascular: Regular rhythm.  Lungs: Normal work of breathing. Neurologic: No focal deficits.   Lab Results  Component Value Date   CREATININE  0.69 08/13/2021   BUN 14 08/13/2021   NA 143 08/13/2021   K 4.8 08/13/2021   CL 105 08/13/2021   CO2 24 08/13/2021   Lab Results  Component Value Date   ALT 18 08/13/2021   AST 21 08/13/2021   ALKPHOS 82 08/13/2021   BILITOT 0.3 08/13/2021   Lab Results   Component Value Date   HGBA1C 5.5 08/13/2021   HGBA1C 5.4 04/03/2021   HGBA1C 5.6 08/13/2020   HGBA1C 5.5 04/24/2020   HGBA1C 5.6 09/19/2019   Lab Results  Component Value Date   INSULIN 4.4 08/13/2021   INSULIN 7.1 04/03/2021   INSULIN 6.8 08/13/2020   INSULIN 4.8 09/19/2019   INSULIN 12.3 07/14/2019   Lab Results  Component Value Date   TSH 2.890 08/13/2021   Lab Results  Component Value Date   CHOL 187 04/03/2021   HDL 54 04/03/2021   LDLCALC 121 (H) 04/03/2021   TRIG 63 04/03/2021   Lab Results  Component Value Date   VD25OH 82.3 08/13/2021   VD25OH 57.7 04/03/2021   VD25OH 50.5 08/13/2020   Lab Results  Component Value Date   WBC 6.1 04/24/2020   HGB 13.6 04/24/2020   HCT 42 04/24/2020   MCV 98 (H) 09/19/2019   PLT 297 04/24/2020   No results found for: "IRON", "TIBC", "FERRITIN"  Attestation Statements:   Reviewed by clinician on day of visit: allergies, medications, problem list, medical history, surgical history, family history, social history, and previous encounter notes.   I, Burt Knack, am acting as transcriptionist for Quillian Quince, MD.  I have reviewed the above documentation for accuracy and completeness, and I agree with the above. -  Quillian Quince, MD

## 2021-12-20 ENCOUNTER — Other Ambulatory Visit (INDEPENDENT_AMBULATORY_CARE_PROVIDER_SITE_OTHER): Payer: Self-pay | Admitting: Family Medicine

## 2021-12-20 DIAGNOSIS — K219 Gastro-esophageal reflux disease without esophagitis: Secondary | ICD-10-CM

## 2021-12-30 ENCOUNTER — Ambulatory Visit (INDEPENDENT_AMBULATORY_CARE_PROVIDER_SITE_OTHER): Payer: 59 | Admitting: Family Medicine

## 2021-12-30 ENCOUNTER — Encounter (INDEPENDENT_AMBULATORY_CARE_PROVIDER_SITE_OTHER): Payer: Self-pay | Admitting: Family Medicine

## 2021-12-30 VITALS — BP 100/60 | HR 58 | Temp 97.6°F | Ht 61.0 in | Wt 183.0 lb

## 2021-12-30 DIAGNOSIS — R7303 Prediabetes: Secondary | ICD-10-CM

## 2021-12-30 DIAGNOSIS — E559 Vitamin D deficiency, unspecified: Secondary | ICD-10-CM | POA: Diagnosis not present

## 2021-12-30 DIAGNOSIS — E782 Mixed hyperlipidemia: Secondary | ICD-10-CM | POA: Diagnosis not present

## 2021-12-30 DIAGNOSIS — F3289 Other specified depressive episodes: Secondary | ICD-10-CM

## 2021-12-30 DIAGNOSIS — Z6834 Body mass index (BMI) 34.0-34.9, adult: Secondary | ICD-10-CM

## 2021-12-30 DIAGNOSIS — K219 Gastro-esophageal reflux disease without esophagitis: Secondary | ICD-10-CM | POA: Insufficient documentation

## 2021-12-30 DIAGNOSIS — E669 Obesity, unspecified: Secondary | ICD-10-CM

## 2021-12-30 MED ORDER — OMEPRAZOLE 40 MG PO CPDR: 40.0000 mg | DELAYED_RELEASE_CAPSULE | Freq: Every day | ORAL | 0 refills | Status: DC

## 2021-12-30 MED ORDER — ESCITALOPRAM OXALATE 10 MG PO TABS: 10.0000 mg | ORAL_TABLET | Freq: Every day | ORAL | 0 refills | Status: DC

## 2021-12-31 LAB — LIPID PANEL WITH LDL/HDL RATIO
Cholesterol, Total: 192 mg/dL (ref 100–199)
HDL: 57 mg/dL (ref >39)
LDL Chol Calc (NIH): 123 mg/dL — ABNORMAL HIGH (ref 0–99)
LDL/HDL Ratio: 2.2 ratio (ref 0.0–3.2)
Triglycerides: 63 mg/dL (ref 0–149)
VLDL Cholesterol Cal: 12 mg/dL (ref 5–40)

## 2021-12-31 LAB — CBC WITH DIFFERENTIAL/PLATELET
Basophils Absolute: 0.1 10*3/uL (ref 0.0–0.2)
Basos: 1 %
EOS (ABSOLUTE): 0.2 10*3/uL (ref 0.0–0.4)
Eos: 4 %
Hematocrit: 39.6 % (ref 34.0–46.6)
Hemoglobin: 13.2 g/dL (ref 11.1–15.9)
Immature Grans (Abs): 0 10*3/uL (ref 0.0–0.1)
Immature Granulocytes: 0 %
Lymphocytes Absolute: 1.7 10*3/uL (ref 0.7–3.1)
Lymphs: 32 %
MCH: 32 pg (ref 26.6–33.0)
MCHC: 33.3 g/dL (ref 31.5–35.7)
MCV: 96 fL (ref 79–97)
Monocytes Absolute: 0.6 10*3/uL (ref 0.1–0.9)
Monocytes: 12 %
Neutrophils Absolute: 2.7 10*3/uL (ref 1.4–7.0)
Neutrophils: 51 %
Platelets: 271 10*3/uL (ref 150–450)
RBC: 4.13 x10E6/uL (ref 3.77–5.28)
RDW: 12.7 % (ref 11.7–15.4)
WBC: 5.4 10*3/uL (ref 3.4–10.8)

## 2021-12-31 LAB — VITAMIN D 25 HYDROXY (VIT D DEFICIENCY, FRACTURES): Vit D, 25-Hydroxy: 34 ng/mL (ref 30.0–100.0)

## 2021-12-31 LAB — HEMOGLOBIN A1C
Est. average glucose Bld gHb Est-mCnc: 114 mg/dL
Hgb A1c MFr Bld: 5.6 % (ref 4.8–5.6)

## 2021-12-31 LAB — CMP14+EGFR
ALT: 13 IU/L (ref 0–32)
AST: 16 IU/L (ref 0–40)
Albumin/Globulin Ratio: 1.9 (ref 1.2–2.2)
Albumin: 4.3 g/dL (ref 3.8–4.9)
Alkaline Phosphatase: 83 IU/L (ref 44–121)
BUN/Creatinine Ratio: 21 (ref 9–23)
BUN: 14 mg/dL (ref 6–24)
Bilirubin Total: 0.3 mg/dL (ref 0.0–1.2)
CO2: 25 mmol/L (ref 20–29)
Calcium: 10.1 mg/dL (ref 8.7–10.2)
Chloride: 105 mmol/L (ref 96–106)
Creatinine, Ser: 0.66 mg/dL (ref 0.57–1.00)
Globulin, Total: 2.3 g/dL (ref 1.5–4.5)
Glucose: 83 mg/dL (ref 70–99)
Potassium: 4.9 mmol/L (ref 3.5–5.2)
Sodium: 146 mmol/L — ABNORMAL HIGH (ref 134–144)
Total Protein: 6.6 g/dL (ref 6.0–8.5)
eGFR: 102 mL/min/{1.73_m2} (ref >59)

## 2021-12-31 LAB — INSULIN, RANDOM: INSULIN: 4.3 u[IU]/mL (ref 2.6–24.9)

## 2021-12-31 LAB — VITAMIN B12: Vitamin B-12: 530 pg/mL (ref 232–1245)

## 2021-12-31 NOTE — Progress Notes (Signed)
Chief Complaint:   OBESITY Alexandria Ward is here to discuss her progress with her obesity treatment plan along with follow-up of her obesity related diagnoses. Alexandria Ward is on the Category 2 Plan and states she is following her eating plan approximately 40% of the time. Harjot states she is doing 0 minutes 0 times per week.  Today's visit was #: 65 Starting weight: 206 lbs Starting date: 07/14/2019 Today's weight: 183 lbs Today's date: 12/30/2021 Total lbs lost to date: 23 Total lbs lost since last in-office visit: 1  Interim History: Alexandria Ward continues to do well with weight loss.  She struggles with meal planning, but she is trying to eat healthy and make good choices.  Subjective:   1. Vitamin D deficiency Alexandria Ward is on vitamin D, and she is due for labs.  2. Mixed hyperlipidemia Alexandria Ward is on a low cholesterol diet, and she is due for labs.  3. Pre-diabetes Alexandria Ward continues to try to decrease simple carbohydrates, and her hunger is somewhat controlled.  4. Gastroesophageal reflux disease, unspecified whether esophagitis present Alexandria Ward is getting good relief with Prilosec.  5. Other depression with emotional eating Alexandria Ward's mood is stable on Lexapro and she is working on decreasing emotional eating behaviors.  She requests a refill today.  Assessment/Plan:   1. Vitamin D deficiency We will check labs today.  Lacinda will follow-up for routine testing of Vitamin D, at least 2-3 times per year to avoid over-replacement.  - Vitamin B12 - VITAMIN D 25 Hydroxy (Vit-D Deficiency, Fractures)  2. Mixed hyperlipidemia We will check labs today.  Conswella will continue to work on diet, exercise and weight loss efforts. Orders and follow up as documented in patient record.   - Lipid Panel With LDL/HDL Ratio  3. Pre-diabetes We will check labs today.  Rahcel will continue to work on weight loss, exercise, and decreasing simple carbohydrates to help decrease the risk of diabetes.   - CMP14+EGFR - CBC  with Differential/Platelet - Insulin, random - Hemoglobin A1c  4. Gastroesophageal reflux disease, unspecified whether esophagitis present Alexandria Ward will continue Prilosec 40 mg once daily, and we will refill for 1 month.  - omeprazole (PRILOSEC) 40 MG capsule; Take 1 capsule (40 mg total) by mouth daily.  Dispense: 30 capsule; Refill: 0  5. Other depression with emotional eating Alexandria Ward will continue Lexapro 10 mg once daily, we will refill for 1 month.   - escitalopram (LEXAPRO) 10 MG tablet; Take 1 tablet (10 mg total) by mouth daily.  Dispense: 30 tablet; Refill: 0  6. Obesity, Current BMI 34.6 [name] is currently in the action stage of change. As such, her goal is to continue with weight loss efforts. She has agreed to the Category 2 Plan.   Behavioral modification strategies: increasing lean protein intake and meal planning and cooking strategies.  [name] has agreed to follow-up with our clinic in 4 weeks. She was informed of the importance of frequent follow-up visits to maximize her success with intensive lifestyle modifications for her multiple health conditions.   Alexandria Ward was informed we would discuss her lab results at her next visit unless there is a critical issue that needs to be addressed sooner. Alexandria Ward agreed to keep her next visit at the agreed upon time to discuss these results.  Objective:   Blood pressure 100/60, pulse (!) 58, temperature 97.6 F (36.4 C), height $RemoveBe'5\' 1"'[id number]$  (1.549 m), weight 183 lb (83 kg), SpO2 98 %. Body mass index is 34.58 kg/m.  General: Cooperative, alert,  well developed, in no acute distress. HEENT: Conjunctivae and lids unremarkable. Cardiovascular: Regular rhythm.  Lungs: Normal work of breathing. Neurologic: No focal deficits.   Lab Results  Component Value Date   CREATININE 0.66 12/30/2021   BUN 14 12/30/2021   NA 146 (H) 12/30/2021   K 4.9 12/30/2021   CL 105 12/30/2021   CO2 25 12/30/2021   Lab Results  Component Value Date   ALT 13  12/30/2021   AST 16 12/30/2021   ALKPHOS 83 12/30/2021   BILITOT 0.3 12/30/2021   Lab Results  Component Value Date   HGBA1C 5.6 12/30/2021   HGBA1C 5.5 08/13/2021   HGBA1C 5.4 04/03/2021   HGBA1C 5.6 08/13/2020   HGBA1C 5.5 04/24/2020   Lab Results  Component Value Date   INSULIN 4.3 12/30/2021   INSULIN 4.4 08/13/2021   INSULIN 7.1 04/03/2021   INSULIN 6.8 08/13/2020   INSULIN 4.8 09/19/2019   Lab Results  Component Value Date   TSH 2.890 08/13/2021   Lab Results  Component Value Date   CHOL 192 12/30/2021   HDL 57 12/30/2021   LDLCALC 123 (H) 12/30/2021   TRIG 63 12/30/2021   Lab Results  Component Value Date   VD25OH 34.0 12/30/2021   VD25OH 82.3 08/13/2021   VD25OH 57.7 04/03/2021   Lab Results  Component Value Date   WBC 5.4 12/30/2021   HGB 13.2 12/30/2021   HCT 39.6 12/30/2021   MCV 96 12/30/2021   PLT 271 12/30/2021   No results found for: "IRON", "TIBC", "FERRITIN"  Attestation Statements:   Reviewed by clinician on day of visit: allergies, medications, problem list, medical history, surgical history, family history, social history, and previous encounter notes.   I, Trixie Dredge, am acting as transcriptionist for Dennard Nip, MD.  I have reviewed the above documentation for accuracy and completeness, and I agree with the above. -  Dennard Nip, MD

## 2022-01-22 ENCOUNTER — Other Ambulatory Visit (INDEPENDENT_AMBULATORY_CARE_PROVIDER_SITE_OTHER): Payer: Self-pay | Admitting: Family Medicine

## 2022-01-22 ENCOUNTER — Encounter (INDEPENDENT_AMBULATORY_CARE_PROVIDER_SITE_OTHER): Payer: Self-pay

## 2022-01-22 DIAGNOSIS — K219 Gastro-esophageal reflux disease without esophagitis: Secondary | ICD-10-CM

## 2022-01-27 ENCOUNTER — Ambulatory Visit (INDEPENDENT_AMBULATORY_CARE_PROVIDER_SITE_OTHER): Payer: 59 | Admitting: Family Medicine

## 2022-01-27 ENCOUNTER — Encounter (INDEPENDENT_AMBULATORY_CARE_PROVIDER_SITE_OTHER): Payer: Self-pay | Admitting: Family Medicine

## 2022-01-27 VITALS — BP 105/74 | HR 80 | Temp 98.4°F | Ht 61.0 in | Wt 187.0 lb

## 2022-01-27 DIAGNOSIS — F3289 Other specified depressive episodes: Secondary | ICD-10-CM

## 2022-01-27 DIAGNOSIS — F32A Depression, unspecified: Secondary | ICD-10-CM | POA: Insufficient documentation

## 2022-01-27 DIAGNOSIS — Z636 Dependent relative needing care at home: Secondary | ICD-10-CM

## 2022-01-27 DIAGNOSIS — E559 Vitamin D deficiency, unspecified: Secondary | ICD-10-CM | POA: Diagnosis not present

## 2022-01-27 DIAGNOSIS — K219 Gastro-esophageal reflux disease without esophagitis: Secondary | ICD-10-CM

## 2022-01-27 DIAGNOSIS — E7849 Other hyperlipidemia: Secondary | ICD-10-CM

## 2022-01-27 DIAGNOSIS — E669 Obesity, unspecified: Secondary | ICD-10-CM

## 2022-01-27 DIAGNOSIS — Z6835 Body mass index (BMI) 35.0-35.9, adult: Secondary | ICD-10-CM

## 2022-01-27 MED ORDER — OMEPRAZOLE 40 MG PO CPDR: 40.0000 mg | DELAYED_RELEASE_CAPSULE | Freq: Every day | ORAL | 0 refills | Status: DC

## 2022-01-27 MED ORDER — ESCITALOPRAM OXALATE 10 MG PO TABS: 10.0000 mg | ORAL_TABLET | Freq: Every day | ORAL | 0 refills | Status: DC

## 2022-01-27 MED ORDER — VITAMIN D (ERGOCALCIFEROL) 1.25 MG (50000 UNIT) PO CAPS: 50000.0000 [IU] | ORAL_CAPSULE | ORAL | 0 refills | Status: DC

## 2022-02-05 NOTE — Progress Notes (Signed)
Chief Complaint:   OBESITY Alexandria Ward is here to discuss her progress with her obesity treatment plan along with follow-up of her obesity related diagnoses. Alexandria Ward is on the Category 2 Plan and states she is following her eating plan approximately 20% of the time. Alexandria Ward states she is doing 0 minutes 0 times per week.  Today's visit was #: 33 Starting weight: 206 lbs Starting date: 07/14/2019 Today's weight: 187 lbs Today's date: 01/27/2022 Total lbs lost to date: 19 Total lbs lost since last in-office visit: 0  Interim History: Alexandria Ward has been caring for her elderly mother after she had shoulder surgery. She hasn't been able to follow her plan as closely and she has done more grab and go eating. She feels better when she eats gluten free.   Subjective:   1. Vitamin D deficiency Alexandria Ward's Vitamin D level has fallen below goal since being off her Vitamin D.   2. Caregiver stress Alexandria Ward is the sole caregiver for her mother and she is single with other family support, and her stress level is high.   3. Gastroesophageal reflux disease, unspecified whether esophagitis present Alexandria Ward is stable on Prilosec, with no GERD symptoms while taking Prilosec.   4. Hyperlipidemia, pure Alexandria Ward's HDL and triglycerides are at goal but LDL remains elevated. This appears to likely be due to genetics versus diet.   5. Other depression with emotional eating Alexandria Ward is stable on her medications, with no side effects noted.   Assessment/Plan:   1. Vitamin D deficiency Alexandria Ward agreed to restart prescription Vitamin D 50,000 IU every week, with no refills. She will follow-up for routine testing of Vitamin D, at least 2-3 times per year to avoid over-replacement.  - Vitamin D, Ergocalciferol, (DRISDOL) 1.25 MG (50000 UNIT) CAPS capsule; Take 1 capsule (50,000 Units total) by mouth every 7 (seven) days.  Dispense: 4 capsule; Refill: 0  2. Caregiver stress Alexandria Ward was offered support and encouragement. She was advised  to check with her mother's insurance and PCP to see what support services her mother qualify's for.   3. Gastroesophageal reflux disease, unspecified whether esophagitis present Alexandria Ward will continue Prilosec 40 mg once daily, and we will refill for 1 month.  - omeprazole (PRILOSEC) 40 MG capsule; Take 1 capsule (40 mg total) by mouth daily.  Dispense: 30 capsule; Refill: 0  4. Hyperlipidemia, pure Alexandria Ward is to discuss calculating her ASCVD risk at her next visit to see if statins are indicated.   5. Other depression with emotional eating Alexandria Ward will contiue Lexapro 10 mg once daily, and we will refill for 1 month.  - escitalopram (LEXAPRO) 10 MG tablet; Take 1 tablet (10 mg total) by mouth daily.  Dispense: 30 tablet; Refill: 0  6. Obesity, Current BMI 35.4 Alexandria Ward is currently in the action stage of change. As such, her goal is to continue with weight loss efforts. She has agreed to practicing portion control and making smarter food choices, such as increasing vegetables and decreasing simple carbohydrates.   Behavioral modification strategies: increasing lean protein intake and increasing vegetables.  Alexandria Ward has agreed to follow-up with our clinic in 3 to 4 weeks. She was informed of the importance of frequent follow-up visits to maximize her success with intensive lifestyle modifications for her multiple health conditions.   Objective:   Blood pressure 105/74, pulse 80, temperature 98.4 F (36.9 C), height 5\' 1"  (1.549 m), weight 187 lb (84.8 kg), SpO2 96 %. Body mass index is 35.33 kg/m.  General:  Cooperative, alert, well developed, in no acute distress. HEENT: Conjunctivae and lids unremarkable. Cardiovascular: Regular rhythm.  Lungs: Normal work of breathing. Neurologic: No focal deficits.   Lab Results  Component Value Date   CREATININE 0.66 12/30/2021   BUN 14 12/30/2021   NA 146 (H) 12/30/2021   K 4.9 12/30/2021   CL 105 12/30/2021   CO2 25 12/30/2021   Lab Results   Component Value Date   ALT 13 12/30/2021   AST 16 12/30/2021   ALKPHOS 83 12/30/2021   BILITOT 0.3 12/30/2021   Lab Results  Component Value Date   HGBA1C 5.6 12/30/2021   HGBA1C 5.5 08/13/2021   HGBA1C 5.4 04/03/2021   HGBA1C 5.6 08/13/2020   HGBA1C 5.5 04/24/2020   Lab Results  Component Value Date   INSULIN 4.3 12/30/2021   INSULIN 4.4 08/13/2021   INSULIN 7.1 04/03/2021   INSULIN 6.8 08/13/2020   INSULIN 4.8 09/19/2019   Lab Results  Component Value Date   TSH 2.890 08/13/2021   Lab Results  Component Value Date   CHOL 192 12/30/2021   HDL 57 12/30/2021   LDLCALC 123 (H) 12/30/2021   TRIG 63 12/30/2021   Lab Results  Component Value Date   VD25OH 34.0 12/30/2021   VD25OH 82.3 08/13/2021   VD25OH 57.7 04/03/2021   Lab Results  Component Value Date   WBC 5.4 12/30/2021   HGB 13.2 12/30/2021   HCT 39.6 12/30/2021   MCV 96 12/30/2021   PLT 271 12/30/2021   No results found for: "IRON", "TIBC", "FERRITIN"  Attestation Statements:   Reviewed by clinician on day of visit: allergies, medications, problem list, medical history, surgical history, family history, social history, and previous encounter notes.   I, Burt Knack, am acting as transcriptionist for Quillian Quince, MD.  I have reviewed the above documentation for accuracy and completeness, and I agree with the above. -  Quillian Quince, MD

## 2022-02-23 ENCOUNTER — Other Ambulatory Visit (INDEPENDENT_AMBULATORY_CARE_PROVIDER_SITE_OTHER): Payer: Self-pay | Admitting: Family Medicine

## 2022-02-23 DIAGNOSIS — K219 Gastro-esophageal reflux disease without esophagitis: Secondary | ICD-10-CM

## 2022-02-24 ENCOUNTER — Ambulatory Visit (INDEPENDENT_AMBULATORY_CARE_PROVIDER_SITE_OTHER): Payer: 59 | Admitting: Family Medicine

## 2022-02-24 ENCOUNTER — Encounter (INDEPENDENT_AMBULATORY_CARE_PROVIDER_SITE_OTHER): Payer: Self-pay | Admitting: Family Medicine

## 2022-02-24 VITALS — BP 98/63 | HR 71 | Temp 98.2°F | Ht 61.0 in | Wt 188.0 lb

## 2022-02-24 DIAGNOSIS — K219 Gastro-esophageal reflux disease without esophagitis: Secondary | ICD-10-CM | POA: Diagnosis not present

## 2022-02-24 DIAGNOSIS — Z6835 Body mass index (BMI) 35.0-35.9, adult: Secondary | ICD-10-CM

## 2022-02-24 DIAGNOSIS — E669 Obesity, unspecified: Secondary | ICD-10-CM

## 2022-02-24 DIAGNOSIS — F3289 Other specified depressive episodes: Secondary | ICD-10-CM | POA: Diagnosis not present

## 2022-02-24 DIAGNOSIS — E559 Vitamin D deficiency, unspecified: Secondary | ICD-10-CM

## 2022-02-24 DIAGNOSIS — Z6838 Body mass index (BMI) 38.0-38.9, adult: Secondary | ICD-10-CM

## 2022-02-24 MED ORDER — OMEPRAZOLE 40 MG PO CPDR: 40.0000 mg | DELAYED_RELEASE_CAPSULE | Freq: Every day | ORAL | 0 refills | Status: DC

## 2022-02-24 MED ORDER — ESCITALOPRAM OXALATE 20 MG PO TABS: 20.0000 mg | ORAL_TABLET | Freq: Every day | ORAL | 0 refills | Status: DC

## 2022-02-24 MED ORDER — VITAMIN D (ERGOCALCIFEROL) 1.25 MG (50000 UNIT) PO CAPS: 50000.0000 [IU] | ORAL_CAPSULE | ORAL | 0 refills | Status: DC

## 2022-03-02 NOTE — Progress Notes (Unsigned)
Chief Complaint:   OBESITY Alexandria Ward is here to discuss her progress with her obesity treatment plan along with follow-up of her obesity related diagnoses. Alexandria Ward is on practicing portion control and making smarter food choices, such as increasing vegetables and decreasing simple carbohydrates and states she is following her eating plan approximately 30% of the time. Alexandria Ward states she is doing 0 minutes 0 times per week.  Today's visit was #: 34 Starting weight: 206 lbs Starting date: 07/14/2019 Today's weight: 188 lbs Today's date: 02/24/2022 Total lbs lost to date: 18 Total lbs lost since last in-office visit: 0  Interim History: Alexandria Ward is busy caring for her mother and she has not been able to concentrate on her own nutrition and health.  She is mindful at times, but she is still struggling to plan meals.  Subjective:   1. Gastroesophageal reflux disease, unspecified whether esophagitis present Alexandria Ward is stable on Prilosec, with no side effects noted.   2. Vitamin D deficiency Alexandria Ward's Vitamin D level has fallen and is below goal.   3. Other depression with emotional eating Alexandria Ward continues to work on decreasing emotional eating behaviors, but notes increased stress and some caregiver fatigue.  Assessment/Plan:   1. Gastroesophageal reflux disease, unspecified whether esophagitis present Alexandria Ward will continue Prilosec 40 mg once daily, and we will refill for 1 month.  - omeprazole (PRILOSEC) 40 MG capsule; Take 1 capsule (40 mg total) by mouth daily.  Dispense: 30 capsule; Refill: 0  2. Vitamin D deficiency We will refill prescription Vitamin D 50,000 IU every week for 1 month. Alexandria Ward will follow-up for routine testing of Vitamin D, at least 2-3 times per year to avoid over-replacement.  - Vitamin D, Ergocalciferol, (DRISDOL) 1.25 MG (50000 UNIT) CAPS capsule; Take 1 capsule (50,000 Units total) by mouth every 7 (seven) days.  Dispense: 4 capsule; Refill: 0  3. Other depression with  emotional eating Alexandria Ward was referred to Dr. Dewaine Ward, our bariatric psychologist to work on emotional eating behavior strategies and healthy aging.  Alexandria Ward agreed to increase Alexandria Ward to 20 mg once daily, with no refills.  - escitalopram (Alexandria Ward) 20 MG tablet; Take 1 tablet (20 mg total) by mouth daily.  Dispense: 30 tablet; Refill: 0  4. Obesity, Current BMI 35.7 Alexandria Ward is currently in the action stage of change. As such, her goal is to continue with weight loss efforts. She has agreed to practicing portion control and making smarter food choices, such as increasing vegetables and decreasing simple carbohydrates.   Behavioral modification strategies: emotional eating strategies and dealing with family or coworker sabotage.  Alexandria Ward has agreed to follow-up with our clinic in 3 to 4 weeks. She was informed of the importance of frequent follow-up visits to maximize her success with intensive lifestyle modifications for her multiple health conditions.   Objective:   Blood pressure 98/63, pulse 71, temperature 98.2 F (36.8 C), height 5\' 1"  (1.549 m), weight 188 lb (85.3 kg), SpO2 97 %. Body mass index is 35.52 kg/m.  General: Cooperative, alert, well developed, in no acute distress. HEENT: Conjunctivae and lids unremarkable. Cardiovascular: Regular rhythm.  Lungs: Normal work of breathing. Neurologic: No focal deficits.   Lab Results  Component Value Date   CREATININE 0.66 12/30/2021   BUN 14 12/30/2021   NA 146 (H) 12/30/2021   K 4.9 12/30/2021   CL 105 12/30/2021   CO2 25 12/30/2021   Lab Results  Component Value Date   ALT 13 12/30/2021   AST 16  12/30/2021   ALKPHOS 83 12/30/2021   BILITOT 0.3 12/30/2021   Lab Results  Component Value Date   HGBA1C 5.6 12/30/2021   HGBA1C 5.5 08/13/2021   HGBA1C 5.4 04/03/2021   HGBA1C 5.6 08/13/2020   HGBA1C 5.5 04/24/2020   Lab Results  Component Value Date   INSULIN 4.3 12/30/2021   INSULIN 4.4 08/13/2021   INSULIN 7.1 04/03/2021    INSULIN 6.8 08/13/2020   INSULIN 4.8 09/19/2019   Lab Results  Component Value Date   TSH 2.890 08/13/2021   Lab Results  Component Value Date   CHOL 192 12/30/2021   HDL 57 12/30/2021   LDLCALC 123 (H) 12/30/2021   TRIG 63 12/30/2021   Lab Results  Component Value Date   VD25OH 34.0 12/30/2021   VD25OH 82.3 08/13/2021   VD25OH 57.7 04/03/2021   Lab Results  Component Value Date   WBC 5.4 12/30/2021   HGB 13.2 12/30/2021   HCT 39.6 12/30/2021   MCV 96 12/30/2021   PLT 271 12/30/2021   No results found for: "IRON", "TIBC", "FERRITIN"  Attestation Statements:   Reviewed by clinician on day of visit: allergies, medications, problem list, medical history, surgical history, family history, social history, and previous encounter notes.   I, Trixie Dredge, am acting as transcriptionist for Dennard Nip, MD.  I have reviewed the above documentation for accuracy and completeness, and I agree with the above. -  Dennard Nip, MD

## 2022-03-17 ENCOUNTER — Encounter (INDEPENDENT_AMBULATORY_CARE_PROVIDER_SITE_OTHER): Payer: Self-pay | Admitting: Family Medicine

## 2022-03-17 ENCOUNTER — Ambulatory Visit (INDEPENDENT_AMBULATORY_CARE_PROVIDER_SITE_OTHER): Payer: 59 | Admitting: Family Medicine

## 2022-03-17 VITALS — BP 93/53 | HR 70 | Temp 97.9°F | Ht 61.0 in | Wt 190.0 lb

## 2022-03-17 DIAGNOSIS — R5383 Other fatigue: Secondary | ICD-10-CM

## 2022-03-17 DIAGNOSIS — F3289 Other specified depressive episodes: Secondary | ICD-10-CM | POA: Diagnosis not present

## 2022-03-17 DIAGNOSIS — E669 Obesity, unspecified: Secondary | ICD-10-CM

## 2022-03-17 DIAGNOSIS — K219 Gastro-esophageal reflux disease without esophagitis: Secondary | ICD-10-CM

## 2022-03-17 DIAGNOSIS — E559 Vitamin D deficiency, unspecified: Secondary | ICD-10-CM | POA: Diagnosis not present

## 2022-03-17 DIAGNOSIS — Z6835 Body mass index (BMI) 35.0-35.9, adult: Secondary | ICD-10-CM

## 2022-03-17 MED ORDER — VITAMIN D (ERGOCALCIFEROL) 1.25 MG (50000 UNIT) PO CAPS: 50000.0000 [IU] | ORAL_CAPSULE | ORAL | 0 refills | Status: DC

## 2022-03-17 MED ORDER — OMEPRAZOLE 40 MG PO CPDR: 40.0000 mg | DELAYED_RELEASE_CAPSULE | Freq: Every day | ORAL | 0 refills | Status: DC

## 2022-03-17 MED ORDER — ESCITALOPRAM OXALATE 20 MG PO TABS: 20.0000 mg | ORAL_TABLET | Freq: Every day | ORAL | 0 refills | Status: DC

## 2022-03-18 NOTE — Progress Notes (Signed)
Chief Complaint:   OBESITY Alexandria Ward is here to discuss her progress with her obesity treatment plan along with follow-up of her obesity related diagnoses. Alexandria Ward is on practicing portion control and making smarter food choices, such as increasing vegetables and decreasing simple carbohydrates and states she is following her eating plan approximately 50% of the time. Alexandria Ward states she is doing 0 minutes 0 times per week.  Today's visit was #: 71 Starting weight: 206 lbs Starting date: 07/14/2019 Today's weight: 190 lbs Today's date: 03/17/2022 Total lbs lost to date: 16 Total lbs lost since last in-office visit: 0  Interim History: Alexandria Ward has done well overall with weight loss. She went to caregiver support group for help with her mom. She found it helpful. She notes some increased work stress. She is struggling to get in all of her protein, meal planning and prepping.   Subjective:   1. Vitamin D deficiency Alexandria Ward is on vitamin D prescription 50,000 units once weekly, with no side effects noted.  2. Gastroesophageal reflux disease, unspecified whether esophagitis present Alexandria Ward is on Prilosec 40 mg daily, no side effects were mentioned.  3. Caregiver with fatigue Alexandria Ward still struggling with organizing her time in dealing with her mother with dementia.  She is going to a caregiver support group.  4. Other depression with emotional eating Alexandria Ward is taking Lexapro without side effects.  She notes her stress is still high.  Assessment/Plan:   1. Vitamin D deficiency We will continue prescription Vitamin D for 1 month. Alexandria Ward will follow-up for routine testing of Vitamin D, at least 2-3 times per year to avoid over-replacement.  - Vitamin D, Ergocalciferol, (DRISDOL) 1.25 MG (50000 UNIT) CAPS capsule; Take 1 capsule (50,000 Units total) by mouth every 7 (seven) days.  Dispense: 4 capsule; Refill: 0  2. Gastroesophageal reflux disease, unspecified whether esophagitis present We will refill  Prilosec 40 mg once daily for 1 month.  Alexandria Ward will continue with her diet and exercise.  - omeprazole (PRILOSEC) 40 MG capsule; Take 1 capsule (40 mg total) by mouth daily.  Dispense: 30 capsule; Refill: 0  3. Caregiver with fatigue We discussed strategies to help time organization, and Alexandria Ward will continue with her support group.  4. Other depression with emotional eating Alexandria Ward will continue Lexapro 20 mg once daily, and we will refill for 1 month.  - escitalopram (LEXAPRO) 20 MG tablet; Take 1 tablet (20 mg total) by mouth daily.  Dispense: 30 tablet; Refill: 0  5. Obesity, Current BMI 35.9 Alexandria Ward is currently in the action stage of change. As such, her goal is to continue with weight loss efforts. She has agreed to following a lower carbohydrate, vegetable and lean protein rich diet plan.   Behavioral modification strategies: increasing lean protein intake, decreasing simple carbohydrates, meal planning and cooking strategies, and emotional eating strategies.  Alexandria Ward has agreed to follow-up with our clinic in 4 weeks. She was informed of the importance of frequent follow-up visits to maximize her success with intensive lifestyle modifications for her multiple health conditions.   Objective:   Blood pressure (!) 93/53, pulse 70, temperature 97.9 F (36.6 C), height 5\' 1"  (1.549 m), weight 190 lb (86.2 kg), SpO2 97 %. Body mass index is 35.9 kg/m.  General: Cooperative, alert, well developed, in no acute distress. HEENT: Conjunctivae and lids unremarkable. Cardiovascular: Regular rhythm.  Lungs: Normal work of breathing. Neurologic: No focal deficits.   Lab Results  Component Value Date   CREATININE 0.66 12/30/2021  BUN 14 12/30/2021   NA 146 (H) 12/30/2021   K 4.9 12/30/2021   CL 105 12/30/2021   CO2 25 12/30/2021   Lab Results  Component Value Date   ALT 13 12/30/2021   AST 16 12/30/2021   ALKPHOS 83 12/30/2021   BILITOT 0.3 12/30/2021   Lab Results  Component Value  Date   HGBA1C 5.6 12/30/2021   HGBA1C 5.5 08/13/2021   HGBA1C 5.4 04/03/2021   HGBA1C 5.6 08/13/2020   HGBA1C 5.5 04/24/2020   Lab Results  Component Value Date   INSULIN 4.3 12/30/2021   INSULIN 4.4 08/13/2021   INSULIN 7.1 04/03/2021   INSULIN 6.8 08/13/2020   INSULIN 4.8 09/19/2019   Lab Results  Component Value Date   TSH 2.890 08/13/2021   Lab Results  Component Value Date   CHOL 192 12/30/2021   HDL 57 12/30/2021   LDLCALC 123 (H) 12/30/2021   TRIG 63 12/30/2021   Lab Results  Component Value Date   VD25OH 34.0 12/30/2021   VD25OH 82.3 08/13/2021   VD25OH 57.7 04/03/2021   Lab Results  Component Value Date   WBC 5.4 12/30/2021   HGB 13.2 12/30/2021   HCT 39.6 12/30/2021   MCV 96 12/30/2021   PLT 271 12/30/2021   No results found for: "IRON", "TIBC", "FERRITIN"  Attestation Statements:   Reviewed by clinician on day of visit: allergies, medications, problem list, medical history, surgical history, family history, social history, and previous encounter notes.   I, Burt Knack, am acting as transcriptionist for Quillian Quince, MD.  I have reviewed the above documentation for accuracy and completeness, and I agree with the above. -  Quillian Quince, MD

## 2022-03-24 ENCOUNTER — Other Ambulatory Visit (INDEPENDENT_AMBULATORY_CARE_PROVIDER_SITE_OTHER): Payer: Self-pay | Admitting: Family Medicine

## 2022-03-24 DIAGNOSIS — E559 Vitamin D deficiency, unspecified: Secondary | ICD-10-CM

## 2022-03-25 ENCOUNTER — Other Ambulatory Visit: Payer: Self-pay | Admitting: *Deleted

## 2022-03-25 ENCOUNTER — Telehealth: Payer: Self-pay | Admitting: Neurology

## 2022-03-25 DIAGNOSIS — G4733 Obstructive sleep apnea (adult) (pediatric): Secondary | ICD-10-CM

## 2022-03-25 DIAGNOSIS — G4700 Insomnia, unspecified: Secondary | ICD-10-CM

## 2022-03-25 NOTE — Telephone Encounter (Signed)
Rx printed, waiting on MD signature 

## 2022-03-25 NOTE — Telephone Encounter (Signed)
Called the pt and was able to get her scheduled for yearly apt. Scheduled with Debbora Presto, NP She has asked that the order be mailed to her. Confirmed address on file.

## 2022-03-25 NOTE — Telephone Encounter (Signed)
Pt would like a new prescription printed for her for electronic sleep device. Pt states she does not remember the name but this device prevents her from rolling in her sleep.  Would like a call when she can pick this up.

## 2022-03-25 NOTE — Telephone Encounter (Signed)
Lvm for pt to schedule yearly f/u appt.

## 2022-04-14 ENCOUNTER — Ambulatory Visit (INDEPENDENT_AMBULATORY_CARE_PROVIDER_SITE_OTHER): Payer: 59 | Admitting: Family Medicine

## 2022-04-14 ENCOUNTER — Encounter (INDEPENDENT_AMBULATORY_CARE_PROVIDER_SITE_OTHER): Payer: Self-pay | Admitting: Family Medicine

## 2022-04-14 VITALS — BP 96/5 | HR 74 | Temp 98.0°F | Ht 61.0 in | Wt 186.0 lb

## 2022-04-14 DIAGNOSIS — E559 Vitamin D deficiency, unspecified: Secondary | ICD-10-CM

## 2022-04-14 DIAGNOSIS — E669 Obesity, unspecified: Secondary | ICD-10-CM

## 2022-04-14 DIAGNOSIS — K219 Gastro-esophageal reflux disease without esophagitis: Secondary | ICD-10-CM

## 2022-04-14 DIAGNOSIS — F3289 Other specified depressive episodes: Secondary | ICD-10-CM | POA: Diagnosis not present

## 2022-04-14 DIAGNOSIS — Z6835 Body mass index (BMI) 35.0-35.9, adult: Secondary | ICD-10-CM

## 2022-04-14 DIAGNOSIS — E66812 Morbid (severe) obesity due to excess calories: Secondary | ICD-10-CM

## 2022-04-14 MED ORDER — OMEPRAZOLE 40 MG PO CPDR: 40.0000 mg | DELAYED_RELEASE_CAPSULE | Freq: Every day | ORAL | 0 refills | Status: DC

## 2022-04-14 MED ORDER — VITAMIN D (ERGOCALCIFEROL) 1.25 MG (50000 UNIT) PO CAPS: 50000.0000 [IU] | ORAL_CAPSULE | ORAL | 0 refills | Status: DC

## 2022-04-22 NOTE — Progress Notes (Unsigned)
Chief Complaint:   OBESITY Alexandria Ward is here to discuss her progress with her obesity treatment plan along with follow-up of her obesity related diagnoses. Alexandria Ward is on {MWMwtlossportion/plan2:23431} and states she is following her eating plan approximately ***% of the time. Alexandria Ward states she is *** *** minutes *** times per week.  Today's visit was #: *** Starting weight: *** Starting date: *** Today's weight: *** Today's date: 04/14/2022 Total lbs lost to date: *** Total lbs lost since last in-office visit: ***  Interim History: ***  Subjective:   1. Vitamin D deficiency ***  2. Gastroesophageal reflux disease, unspecified whether esophagitis present ***  3. Other depression with emotional eating ***  Assessment/Plan:   1. Vitamin D deficiency *** - Vitamin D, Ergocalciferol, (DRISDOL) 1.25 MG (50000 UNIT) CAPS capsule; Take 1 capsule (50,000 Units total) by mouth every 7 (seven) days.  Dispense: 4 capsule; Refill: 0  2. Gastroesophageal reflux disease, unspecified whether esophagitis present *** - omeprazole (PRILOSEC) 40 MG capsule; Take 1 capsule (40 mg total) by mouth daily.  Dispense: 30 capsule; Refill: 0  3. Other depression with emotional eating ***  4. Obesity, Current BMI 35.3 Alexandria Ward is currently in the action stage of change. As such, her goal is to continue with weight loss efforts. She has agreed to practicing portion control and making smarter food choices, such as increasing vegetables and decreasing simple carbohydrates and following a lower carbohydrate, vegetable and lean protein rich diet plan.   We will recheck fasting labs at her next visit.   Exercise goals: All adults should avoid inactivity. Some physical activity is better than none, and adults who participate in any amount of physical activity gain some health benefits.  Behavioral modification strategies: increasing lean protein intake, decreasing simple carbohydrates, increasing water  intake, meal planning and cooking strategies, and holiday eating strategies .  Alexandria Ward has agreed to follow-up with our clinic in 3 to 4 weeks. She was informed of the importance of frequent follow-up visits to maximize her success with intensive lifestyle modifications for her multiple health conditions.   Objective:   Blood pressure (!) 96/5, pulse 74, temperature 98 F (36.7 C), height 5\' 1"  (1.549 m), weight 186 lb (84.4 kg), SpO2 97 %. Body mass index is 35.14 kg/m.  General: Cooperative, alert, well developed, in no acute distress. HEENT: Conjunctivae and lids unremarkable. Cardiovascular: Regular rhythm.  Lungs: Normal work of breathing. Neurologic: No focal deficits.   Lab Results  Component Value Date   CREATININE 0.66 12/30/2021   BUN 14 12/30/2021   NA 146 (H) 12/30/2021   K 4.9 12/30/2021   CL 105 12/30/2021   CO2 25 12/30/2021   Lab Results  Component Value Date   ALT 13 12/30/2021   AST 16 12/30/2021   ALKPHOS 83 12/30/2021   BILITOT 0.3 12/30/2021   Lab Results  Component Value Date   HGBA1C 5.6 12/30/2021   HGBA1C 5.5 08/13/2021   HGBA1C 5.4 04/03/2021   HGBA1C 5.6 08/13/2020   HGBA1C 5.5 04/24/2020   Lab Results  Component Value Date   INSULIN 4.3 12/30/2021   INSULIN 4.4 08/13/2021   INSULIN 7.1 04/03/2021   INSULIN 6.8 08/13/2020   INSULIN 4.8 09/19/2019   Lab Results  Component Value Date   TSH 2.890 08/13/2021   Lab Results  Component Value Date   CHOL 192 12/30/2021   HDL 57 12/30/2021   LDLCALC 123 (H) 12/30/2021   TRIG 63 12/30/2021   Lab Results  Component Value Date   VD25OH 34.0 12/30/2021   VD25OH 82.3 08/13/2021   VD25OH 57.7 04/03/2021   Lab Results  Component Value Date   WBC 5.4 12/30/2021   HGB 13.2 12/30/2021   HCT 39.6 12/30/2021   MCV 96 12/30/2021   PLT 271 12/30/2021   No results found for: "IRON", "TIBC", "FERRITIN"  Attestation Statements:   Reviewed by clinician on day of visit: allergies,  medications, problem list, medical history, surgical history, family history, social history, and previous encounter notes.   I, Trixie Dredge, am acting as transcriptionist for Dennard Nip, MD.  I have reviewed the above documentation for accuracy and completeness, and I agree with the above. -  ***

## 2022-05-13 ENCOUNTER — Encounter (INDEPENDENT_AMBULATORY_CARE_PROVIDER_SITE_OTHER): Payer: Self-pay | Admitting: Family Medicine

## 2022-05-13 ENCOUNTER — Ambulatory Visit (INDEPENDENT_AMBULATORY_CARE_PROVIDER_SITE_OTHER): Payer: 59 | Admitting: Family Medicine

## 2022-05-13 VITALS — BP 103/72 | HR 81 | Temp 98.3°F | Ht 61.0 in | Wt 186.6 lb

## 2022-05-13 DIAGNOSIS — E782 Mixed hyperlipidemia: Secondary | ICD-10-CM | POA: Diagnosis not present

## 2022-05-13 DIAGNOSIS — E66812 Morbid (severe) obesity due to excess calories: Secondary | ICD-10-CM

## 2022-05-13 DIAGNOSIS — E559 Vitamin D deficiency, unspecified: Secondary | ICD-10-CM | POA: Diagnosis not present

## 2022-05-13 DIAGNOSIS — Z6835 Body mass index (BMI) 35.0-35.9, adult: Secondary | ICD-10-CM

## 2022-05-13 DIAGNOSIS — K219 Gastro-esophageal reflux disease without esophagitis: Secondary | ICD-10-CM

## 2022-05-13 DIAGNOSIS — R7303 Prediabetes: Secondary | ICD-10-CM

## 2022-05-13 DIAGNOSIS — E669 Obesity, unspecified: Secondary | ICD-10-CM

## 2022-05-13 MED ORDER — OMEPRAZOLE 40 MG PO CPDR: 40.0000 mg | DELAYED_RELEASE_CAPSULE | Freq: Every day | ORAL | 0 refills | Status: DC

## 2022-05-13 MED ORDER — VITAMIN D (ERGOCALCIFEROL) 1.25 MG (50000 UNIT) PO CAPS: 50000.0000 [IU] | ORAL_CAPSULE | ORAL | 0 refills | Status: DC

## 2022-05-14 LAB — LIPID PANEL WITH LDL/HDL RATIO
Cholesterol, Total: 208 mg/dL — ABNORMAL HIGH (ref 100–199)
HDL: 58 mg/dL (ref >39)
LDL Chol Calc (NIH): 139 mg/dL — ABNORMAL HIGH (ref 0–99)
LDL/HDL Ratio: 2.4 ratio (ref 0.0–3.2)
Triglycerides: 59 mg/dL (ref 0–149)
VLDL Cholesterol Cal: 11 mg/dL (ref 5–40)

## 2022-05-14 LAB — CMP14+EGFR
ALT: 16 IU/L (ref 0–32)
AST: 19 IU/L (ref 0–40)
Albumin/Globulin Ratio: 1.9 (ref 1.2–2.2)
Albumin: 4.3 g/dL (ref 3.8–4.9)
Alkaline Phosphatase: 81 IU/L (ref 44–121)
BUN/Creatinine Ratio: 25 — ABNORMAL HIGH (ref 9–23)
BUN: 20 mg/dL (ref 6–24)
Bilirubin Total: 0.2 mg/dL (ref 0.0–1.2)
CO2: 23 mmol/L (ref 20–29)
Calcium: 10.2 mg/dL (ref 8.7–10.2)
Chloride: 106 mmol/L (ref 96–106)
Creatinine, Ser: 0.79 mg/dL (ref 0.57–1.00)
Globulin, Total: 2.3 g/dL (ref 1.5–4.5)
Glucose: 79 mg/dL (ref 70–99)
Potassium: 4.5 mmol/L (ref 3.5–5.2)
Sodium: 142 mmol/L (ref 134–144)
Total Protein: 6.6 g/dL (ref 6.0–8.5)
eGFR: 87 mL/min/{1.73_m2} (ref >59)

## 2022-05-14 LAB — VITAMIN D 25 HYDROXY (VIT D DEFICIENCY, FRACTURES): Vit D, 25-Hydroxy: 68.3 ng/mL (ref 30.0–100.0)

## 2022-05-14 LAB — HEMOGLOBIN A1C
Est. average glucose Bld gHb Est-mCnc: 117 mg/dL
Hgb A1c MFr Bld: 5.7 % — ABNORMAL HIGH (ref 4.8–5.6)

## 2022-05-14 LAB — VITAMIN B12: Vitamin B-12: 740 pg/mL (ref 232–1245)

## 2022-05-14 LAB — INSULIN, RANDOM: INSULIN: 6.2 u[IU]/mL (ref 2.6–24.9)

## 2022-05-21 NOTE — Progress Notes (Signed)
Chief Complaint:   OBESITY Alexandria Ward is here to discuss her progress with her obesity treatment plan along with follow-up of her obesity related diagnoses. Willadean is on practicing portion control and making smarter food choices, such as increasing vegetables and decreasing simple carbohydrates and states she is following her eating plan approximately 60% of the time. Jassmine states she is doing 0 minutes 0 times per week.  Today's visit was #: 72 Starting weight: 206 lbs Starting date: 07/14/2019 Today's weight: 186 lbs Today's date: 05/13/2022 Total lbs lost to date: 20 Total lbs lost since last in-office visit: 0  Interim History: Machel did very well with avoiding weight gain over Thanksgiving.  She is working on increasing lean protein and portion control.   Subjective:   1. Prediabetes Keri is working on her diet, and she is due for labs.  2. Vitamin D deficiency Tavon is stable on vitamin D, and she is due for labs.  She requests a refill today.  3. Gastroesophageal reflux disease, unspecified whether esophagitis present Lorielle is doing well on omeprazole.  She is working on her weight loss to help decrease her symptoms.  4. Mixed hyperlipidemia Willistine is working on decreasing saturated fats and cholesterol in her diet.  She is due to have labs done.  Assessment/Plan:   1. Prediabetes We will check labs today. Evalise will continue to work on weight loss, exercise, and decreasing simple carbohydrates to help decrease the risk of diabetes.   - CMP14+EGFR - Insulin, random - Vitamin B12 - Hemoglobin A1c  2. Vitamin D deficiency We will check labs today, and we will refill prescription Vitamin D for 1 month. Lashaundra will follow-up for routine testing of Vitamin D, at least 2-3 times per year to avoid over-replacement.  - Vitamin D, Ergocalciferol, (DRISDOL) 1.25 MG (50000 UNIT) CAPS capsule; Take 1 capsule (50,000 Units total) by mouth every 7 (seven) days.  Dispense: 4  capsule; Refill: 0 - VITAMIN D 25 Hydroxy (Vit-D Deficiency, Fractures)  3. Gastroesophageal reflux disease, unspecified whether esophagitis present Florine will continue omeprazole 40 mg once daily, and we will refill for 1 month.  - omeprazole (PRILOSEC) 40 MG capsule; Take 1 capsule (40 mg total) by mouth daily.  Dispense: 30 capsule; Refill: 0  4. Mixed hyperlipidemia We will check labs today. Cherrie will continue to work on diet, exercise and weight loss efforts. Orders and follow up as documented in patient record.   - Lipid Panel With LDL/HDL Ratio  5. Obesity, Current BMI 35.3 Layanna is currently in the action stage of change. As such, her goal is to continue with weight loss efforts. She has agreed to following a lower carbohydrate, vegetable and lean protein rich diet plan.   Exercise goals: All adults should avoid inactivity. Some physical activity is better than none, and adults who participate in any amount of physical activity gain some health benefits.  Behavioral modification strategies: increasing lean protein intake.  Saysha has agreed to follow-up with our clinic in 4 weeks. She was informed of the importance of frequent follow-up visits to maximize her success with intensive lifestyle modifications for her multiple health conditions.   Kamala was informed we would discuss her lab results at her next visit unless there is a critical issue that needs to be addressed sooner. Jeannene agreed to keep her next visit at the agreed upon time to discuss these results.  Objective:   Blood pressure 103/72, pulse 81, temperature 98.3 F (36.8 C), height  _0  (1.549 m), weight 186 lb 9.6 oz (84.6 kg), SpO2 97 %. Body mass index is 35.26 kg/m.  General: Cooperative, alert, well developed, in no acute distress. HEENT: Conjunctivae and lids unremarkable. Cardiovascular: Regular rhythm.  Lungs: Normal work of breathing. Neurologic: No focal deficits.   Lab Results  Component Value  Date   CREATININE 0.79 05/13/2022   BUN 20 05/13/2022   NA 142 05/13/2022   K 4.5 05/13/2022   CL 106 05/13/2022   CO2 23 05/13/2022   Lab Results  Component Value Date   ALT 16 05/13/2022   AST 19 05/13/2022   ALKPHOS 81 05/13/2022   BILITOT 0.2 05/13/2022   Lab Results  Component Value Date   HGBA1C 5.7 (H) 05/13/2022   HGBA1C 5.6 12/30/2021   HGBA1C 5.5 08/13/2021   HGBA1C 5.4 04/03/2021   HGBA1C 5.6 08/13/2020   Lab Results  Component Value Date   INSULIN 6.2 05/13/2022   INSULIN 4.3 12/30/2021   INSULIN 4.4 08/13/2021   INSULIN 7.1 04/03/2021   INSULIN 6.8 08/13/2020   Lab Results  Component Value Date   TSH 2.890 08/13/2021   Lab Results  Component Value Date   CHOL 208 (H) 05/13/2022   HDL 58 05/13/2022   LDLCALC 139 (H) 05/13/2022   TRIG 59 05/13/2022   Lab Results  Component Value Date   VD25OH 68.3 05/13/2022   VD25OH 34.0 12/30/2021   VD25OH 82.3 08/13/2021   Lab Results  Component Value Date   WBC 5.4 12/30/2021   HGB 13.2 12/30/2021   HCT 39.6 12/30/2021   MCV 96 12/30/2021   PLT 271 12/30/2021   No results found for: "IRON", "TIBC", "FERRITIN"  Attestation Statements:   Reviewed by clinician on day of visit: allergies, medications, problem list, medical history, surgical history, family history, social history, and previous encounter notes.   I, Trixie Dredge, am acting as transcriptionist for Dennard Nip, MD.  I have reviewed the above documentation for accuracy and completeness, and I agree with the above. -  Dennard Nip, MD

## 2022-05-24 ENCOUNTER — Other Ambulatory Visit: Payer: Self-pay | Admitting: Neurology

## 2022-05-29 NOTE — Progress Notes (Signed)
PATIENT: Alexandria Ward DOB: [birth year]  REASON FOR VISIT: follow up HISTORY FROM: patient  Chief Complaint  Patient presents with   Follow-up    Pt in room #2 and alone. Pt here today for f/u on her migraines. Pt has some question about her CPAP.   HISTORY OF PRESENT ILLNESS:  06/03/22 ALL: [name] returns for follow up OSA and migraines. Last HST continued to show mild OSA. AutoPAP advised but she had not been able to tolerate CPAP in past. Dr Brett Fairy gave her a rx for the [facility] Balance device to help keep her off her back. She has not submitted rx.   She continues Ajovy every month and sumatriptan injections, ondansetron, tizanidine and ondansetron for abortive therapy. She reports migraines are usually very well managed. Until this week, last migraine was in 02/2022. She has had a lingering headache for the past couple of days. She took sumatriptan injection and zomig but both were expired.   05/29/2020 ALL: Alexandria Ward is a 56 y.o. female here today for follow up for OSA. HST in 07/2019 showed AHI 9/hr with REM AHI 32/hr. She was started on CPAP therapy but was not able to tolerate wearing a mask. She was referred to healthy weight and wellness and has lost 36 pounds since sleep study. She continues to focus on healthy lifestyle habits. She has taken Ambien a few times and it seems to help. She feels that she is nervous to take this regularly as it makes her sleep so deeply.   HISTORY: (copied from previous note)  Alexandria Ward is a 56 year- old Caucasian female patient seen in a RV on 11/28/2019.   I had the pleasure of seeing her in consultation 1/ 12 /2021 and underwent 2 sleep studies. When I first met [name] in January she had a reported history was in 6 months prior to our appointment of vertigo and dizziness she had been seen by vestibular rehab and PT.  She developed ankle edema and swelling and she had reported quite a significant steady weight gain.  We  discussed referral to healthy weight and wellness service of [place].  She enrolled and in the meantime has lost 29 pounds! Her insurance company did not permit [place] to do an attended sleep study also she had a history of parasomnia and vivid dreams.  She underwent a home sleep test on 10 August 2019. Mild obstructive sleep apnea was documented with an overall  Apnea- Hypopnea Index/ hour of sleep (AHI) of 9.2/h and strong  REM sleep dependency. The REM sleep AHI was 32.3/h, three times  the overall AHI. There was also supine sleep position dependency.  Herat rate variability was within normal range and no clinically  significant hypoxemia was noted. Moderately loud snoring was  indicated by RDI.      Unfortunately hospital refused data to reviewed only 6 nights but in the last 3 months.  The user time for those exceptional nights were 3 hours and 41 minutes on average the AutoSet is set between a minimum pressure of 5 and a maximum pressure of 15 was 3 cm EPR and the residual AHI is 1.7 given that she had a mild apnea to begin with this is still a significant reduction in apnea counts.  No central apneas are noted and the air leak is moderate the 95th percentile pressure is only 7.9 cmH2O.  Also she wore a retainer and bite guard. She woke with migraines.  It may help if we are reducing the upper pressure window for the patient. She feels that it was too much pressure.          I have the pleasure of seeing Alexandria Ward today, a right -handed Caucasian female with a possible sleep disorder.  She  has a past medical history of Anemia, Asthma, Back pain, Constipation, Depression, Dyspnea, Fatigue, Fluid retention, Hyperlipidemia, Hypothyroidism, IBS (irritable bowel syndrome), Joint pain, Lower extremity edema, Migraines, Palpitations, Shortness of breath, Tachycardia, Thyroid disease, Vitamin B 12 deficiency, and Vitamin D deficiency.. She is taking synthroid. But developed tachycardia, ankle  edema.and hypotension with Vertigo on BP medication.  She gained 50 pounds , is menopausal. Was diagnosed with hypothyroidism 12 month ago.  She has seen Dr. [name] , who did a (negative) cortisol test.     Sleep relevant medical history:  Family medical /sleep history: maternal Aunt had a CPAP machine, passed away.   Social history:  Patient is working as a Biomedical engineer - IT- and lives in a household alone. Family status is single , without children. Her mother lives across the street.  The patient currently works from home. Pets are present. 2 poodles. Tobacco use never .  ETOH use ; rare , Caffeine intake in form of Coffee( 1 mug in AM ) Soda( none ) Tea ( at lunch ) or energy drinks. Regular exercise- interrupted by Covid.     Sleep habits are as follows: Lunch is with her mom, generally the biggest meal. The patient's dinner time is between 8-8.30 PM. She eats a light dinner. She often falls asleep at her moms watching TV.  Returns to her house- has GERD- takes amitriptyline. She wears a mouth guard. She sis a mouth breather.  The patient goes to bed at 12- PM to 2 AM. She continues to sleep for 3 hours, wakes for 2 bathroom breaks, the first time at 4 AM.   The preferred sleep position is right lateral- , with the support of 2 pillows, the bed is flat- she prefers a recliner.  pillows. Dreams are reportedly infrequent.  7 AM is the usual rise time. The patient wakes up with several  Alarms  At 6.30-- 6.45 AM .   She reports not feeling refreshed or restored in AM, with symptoms such as dry mouth, morning headaches,  and residual fatigue. Naps are taken  lasting several hours and are more refreshing than nocturnal sleep.   REVIEW OF SYSTEMS: Out of a complete 14 system review of symptoms, the patient complains only of the following symptoms, fatigue, headaches and all other reviewed systems are negative.  ALLERGIES: Allergies  Allergen Reactions   Betadine [Povidone Iodine]    Azithromycin  Rash   Ciprofloxacin Rash   Penicillins Rash    Cant remember the reaction or the severity of it   Tape Rash    HOME MEDICATIONS: Outpatient Medications Prior to Visit  Medication Sig Dispense Refill   Carboxymethylcellulose Sod PF 1 % GEL Place 1 application into both eyes as needed.     Digestive Enzymes (DIGESTIVE ENZYME PO) Take 500 mg by mouth.     fluconazole (DIFLUCAN) 150 MG tablet Take 1 tablet by mouth as needed.     fluticasone (FLONASE) 50 MCG/ACT nasal spray Place into the nose.     magnesium gluconate (MAGONATE) 500 MG tablet Take 500 mg by mouth daily.     Multiple Vitamins-Minerals (HAIR/SKIN/NAILS/BIOTIN PO) Take 1 tablet by mouth daily.  omeprazole (PRILOSEC) 40 MG capsule Take 1 capsule (40 mg total) by mouth daily. 30 capsule 0   thyroid (ARMOUR) 15 MG tablet Take 15 mg by mouth daily.     tiZANidine (ZANAFLEX) 4 MG tablet Take 1 tablet (4 mg total) by mouth every 6 (six) hours as needed. 30 tablet 1   UNABLE TO FIND Take 1 tablet by mouth daily. Med Name: Phytoceramides     Vitamin D, Ergocalciferol, (DRISDOL) 1.25 MG (50000 UNIT) CAPS capsule Take 1 capsule (50,000 Units total) by mouth every 7 (seven) days. 4 capsule 0   Fremanezumab-vfrm (AJOVY) 225 MG/1.5ML SOAJ INJECT 225 MG INTO THE SKIN EVERY 30 (THIRTY) DAYS. 1.5 mL 3   ondansetron (ZOFRAN ODT) 4 MG disintegrating tablet Take 1 tablet (4 mg total) by mouth every 8 (eight) hours as needed. 20 tablet 1   SUMAtriptan (IMITREX) 6 MG/0.5ML SOLN injection Inject 0.5 mLs (6 mg total) into the skin every 2 (two) hours as needed for migraine or headache. May repeat in 2 hours if headache persists or recurs. 1 mL 3   zolmitriptan (ZOMIG) 5 MG tablet Take 1 tablet (5 mg total) by mouth as needed for migraine. 10 tablet 1   fexofenadine (ALLEGRA) 180 MG tablet Take by mouth.     No facility-administered medications prior to visit.    PAST MEDICAL HISTORY: Past Medical History:  Diagnosis Date   Anemia    Asthma     Back pain    Constipation    Depression    Dyspnea    Fatigue    Fluid retention    Hyperlipidemia    Hypothyroidism    IBS (irritable bowel syndrome)    Joint pain    Lower extremity edema    Migraines    Palpitations    Shortness of breath    Tachycardia    Thyroid disease    Vitamin B 12 deficiency    Vitamin D deficiency     PAST SURGICAL HISTORY: Past Surgical History:  Procedure Laterality Date   APPENDECTOMY     endometrial ablasion      FAMILY HISTORY: Family History  Problem Relation Age of Onset   Restless legs syndrome Mother    Arthritis Mother    Neuropathy Mother    Hyperlipidemia Mother    Depression Mother    Heart attack Father     SOCIAL HISTORY: Social History   Socioeconomic History   Marital status: Single    Spouse name: Not on file   Number of children: 0   Years of education: Not on file   Highest education level: Not on file  Occupational History   Occupation: Market researcher  Tobacco Use   Smoking status: Never   Smokeless tobacco: Never  Vaping Use   Vaping Use: Never used  Substance and Sexual Activity   Alcohol use: Yes    Comment: rarely   Drug use: Never   Sexual activity: Not on file  Other Topics Concern   Not on file  Social History Narrative   Not on file   Social Determinants of Health   Financial Resource Strain: Not on file  Food Insecurity: Not on file  Transportation Needs: Not on file  Physical Activity: Not on file  Stress: Not on file  Social Connections: Not on file  Intimate Partner Violence: Not on file     PHYSICAL EXAM  Vitals:   06/03/22 1514  BP: 111/67  Pulse: 89  Weight: 194 lb (  88 kg)  Height: _0  (1.549 m)    Body mass index is 36.66 kg/m.  Generalized: Well developed, in no acute distress  Cardiology: normal rate and rhythm, no murmur noted Respiratory: clear to auscultation bilaterally  Neurological examination  Mentation: Alert oriented to time, place,  history taking. Follows all commands speech and language fluent Cranial nerve II-XII: Pupils were equal round reactive to light. Extraocular movements were full, visual field were full  Motor: The motor testing reveals 5 over 5 strength of all 4 extremities. Good symmetric motor tone is noted throughout.  Gait and station: Gait is normal.    DIAGNOSTIC DATA (LABS, IMAGING, TESTING) - I reviewed patient records, labs, notes, testing and imaging myself where available.      No data to display           Lab Results  Component Value Date   WBC 5.4 12/30/2021   HGB 13.2 12/30/2021   HCT 39.6 12/30/2021   MCV 96 12/30/2021   PLT 271 12/30/2021      Component Value Date/Time   NA 142 05/13/2022 0817   K 4.5 05/13/2022 0817   CL 106 05/13/2022 0817   CO2 23 05/13/2022 0817   GLUCOSE 79 05/13/2022 0817   BUN 20 05/13/2022 0817   CREATININE 0.79 05/13/2022 0817   CALCIUM 10.2 05/13/2022 0817   PROT 6.6 05/13/2022 0817   ALBUMIN 4.3 05/13/2022 0817   AST 19 05/13/2022 0817   ALT 16 05/13/2022 0817   ALKPHOS 81 05/13/2022 0817   BILITOT 0.2 05/13/2022 0817   GFRNONAA 68.67 04/24/2020 0000   GFRAA 83.10 04/24/2020 0000   Lab Results  Component Value Date   CHOL 208 (H) 05/13/2022   HDL 58 05/13/2022   LDLCALC 139 (H) 05/13/2022   TRIG 59 05/13/2022   Lab Results  Component Value Date   HGBA1C 5.7 (H) 05/13/2022   Lab Results  Component Value Date   VITAMINB12 740 05/13/2022   Lab Results  Component Value Date   TSH 2.890 08/13/2021     ASSESSMENT AND PLAN 56 y.o. year old female  has a past medical history of Anemia, Asthma, Back pain, Constipation, Depression, Dyspnea, Fatigue, Fluid retention, Hyperlipidemia, Hypothyroidism, IBS (irritable bowel syndrome), Joint pain, Lower extremity edema, Migraines, Palpitations, Shortness of breath, Tachycardia, Thyroid disease, Vitamin B 12 deficiency, and Vitamin D deficiency. here with     ICD-10-CM   1. Migraine without  aura and with status migrainosus, not intractable  G43.001 ketorolac (TORADOL) 30 MG/ML injection 30 mg    ketorolac (TORADOL) 30 MG/ML injection 30 mg    2. OSA (obstructive sleep apnea)  G47.33         BERNESE DOFFING reports headaches are usually well managed. We iwll continue Ajovy every 30 days and sumatriptan injections, Zomig, ondansetron and tizanidine as needed for abortive therapy. Torodol given in office, today, for current headache. She is aware not to take any additional Nsaids for 24 hours. We have given her contact info for [facility] through website. She will continue focusing on healthy lifestyle changes and will continue to follow-up closely with healthy weight and wellness. She will follow up with Dr Brett Fairy in 1 year. She verbalizes understanding and agreement with plan.   No orders of the defined types were placed in this encounter.    Meds ordered this encounter  Medications   Fremanezumab-vfrm (AJOVY) 225 MG/1.5ML SOAJ    Sig: Inject 225 mg into the skin every  30 (thirty) days.    Dispense:  4.5 mL    Refill:  3    Order Specific Question:   Supervising Provider    Answer:   Melvenia Beam [[id number]]   SUMAtriptan (IMITREX) 6 MG/0.5ML SOLN injection    Sig: Inject 0.5 mLs (6 mg total) into the skin every 2 (two) hours as needed for migraine or headache. May repeat in 2 hours if headache persists or recurs.    Dispense:  1 mL    Refill:  3    Order Specific Question:   Supervising Provider    Answer:   Melvenia Beam [[id number]]   ondansetron (ZOFRAN ODT) 4 MG disintegrating tablet    Sig: Take 1 tablet (4 mg total) by mouth every 8 (eight) hours as needed.    Dispense:  20 tablet    Refill:  1    Order Specific Question:   Supervising Provider    Answer:   Melvenia Beam [[id number]]   zolmitriptan (ZOMIG) 5 MG tablet    Sig: Take 1 tablet (5 mg total) by mouth as needed for migraine.    Dispense:  10 tablet    Refill:  1    Order Specific  Question:   Supervising Provider    Answer:   Melvenia Beam [id number]   ketorolac (TORADOL) 30 MG/ML injection 30 mg   ketorolac (TORADOL) 30 MG/ML injection 30 mg        Kelly Ranieri, FNP-C 06/03/2022, 3:50 PM [facility] [address], [place], [place] [address] [phone]

## 2022-06-03 ENCOUNTER — Encounter: Payer: Self-pay | Admitting: Family Medicine

## 2022-06-03 ENCOUNTER — Ambulatory Visit (INDEPENDENT_AMBULATORY_CARE_PROVIDER_SITE_OTHER): Payer: 59 | Admitting: Family Medicine

## 2022-06-03 VITALS — BP 111/67 | HR 89 | Ht 61.0 in | Wt 194.0 lb

## 2022-06-03 DIAGNOSIS — G43001 Migraine without aura, not intractable, with status migrainosus: Secondary | ICD-10-CM | POA: Diagnosis not present

## 2022-06-03 DIAGNOSIS — G4733 Obstructive sleep apnea (adult) (pediatric): Secondary | ICD-10-CM | POA: Diagnosis not present

## 2022-06-03 MED ORDER — AJOVY 225 MG/1.5ML ~~LOC~~ SOAJ: 225.0000 mg | SUBCUTANEOUS | 3 refills | Status: DC

## 2022-06-03 MED ORDER — KETOROLAC TROMETHAMINE 30 MG/ML IJ SOLN
30.0000 mg | Freq: Once | INTRAMUSCULAR | Status: AC
  Administered 2022-06-03: 30 mg via INTRAVENOUS

## 2022-06-03 MED ORDER — ZOLMITRIPTAN 5 MG PO TABS: 5.0000 mg | ORAL_TABLET | ORAL | 1 refills | Status: DC | PRN

## 2022-06-03 MED ORDER — SUMATRIPTAN SUCCINATE 6 MG/0.5ML ~~LOC~~ SOLN: 6.0000 mg | SUBCUTANEOUS | 3 refills | Status: DC | PRN

## 2022-06-03 MED ORDER — KETOROLAC TROMETHAMINE 30 MG/ML IJ SOLN: 30.0000 mg | Freq: Once | INTRAMUSCULAR | Status: AC

## 2022-06-03 MED ORDER — ONDANSETRON 4 MG PO TBDP: 4.0000 mg | ORAL_TABLET | Freq: Three times a day (TID) | ORAL | 1 refills | Status: AC | PRN

## 2022-06-03 NOTE — Patient Instructions (Addendum)
Below is our plan:  We will continue Ajovy monthly. Continue Zomig and or sumatriptan injections as needed. Please take 1 tablet at onset of headache. May take 1 additional tablet in 2 hours if needed. Do not take more than 2 tablets in 24 hours or more than 10 in a month. Continue ondansetron and tizanidine as needed. We have given you an injection of Toradol for your migraine, today.   For the night balance device please call [phone]  Please make sure you are staying well hydrated. I recommend 50-60 ounces daily. Well balanced diet and regular exercise encouraged. Consistent sleep schedule with 6-8 hours recommended.   Please continue follow up with care team as directed.   Follow up with Dr Vickey Huger in 1 year   You may receive a survey regarding today's visit. I encourage you to leave honest feed back as I do use this information to improve patient care. Thank you for seeing me today!

## 2022-06-04 ENCOUNTER — Ambulatory Visit (INDEPENDENT_AMBULATORY_CARE_PROVIDER_SITE_OTHER): Payer: 59 | Admitting: Family Medicine

## 2022-06-04 VITALS — BP 103/70 | HR 74 | Temp 98.0°F | Ht 61.0 in | Wt 190.0 lb

## 2022-06-04 DIAGNOSIS — K219 Gastro-esophageal reflux disease without esophagitis: Secondary | ICD-10-CM | POA: Diagnosis not present

## 2022-06-04 DIAGNOSIS — E7849 Other hyperlipidemia: Secondary | ICD-10-CM | POA: Diagnosis not present

## 2022-06-04 DIAGNOSIS — R7303 Prediabetes: Secondary | ICD-10-CM | POA: Diagnosis not present

## 2022-06-04 DIAGNOSIS — E559 Vitamin D deficiency, unspecified: Secondary | ICD-10-CM

## 2022-06-04 DIAGNOSIS — E669 Obesity, unspecified: Secondary | ICD-10-CM

## 2022-06-04 DIAGNOSIS — Z6835 Body mass index (BMI) 35.0-35.9, adult: Secondary | ICD-10-CM

## 2022-06-04 MED ORDER — VITAMIN D (ERGOCALCIFEROL) 1.25 MG (50000 UNIT) PO CAPS: 50000.0000 [IU] | ORAL_CAPSULE | ORAL | 0 refills | Status: DC

## 2022-06-04 MED ORDER — OMEPRAZOLE 40 MG PO CPDR: 40.0000 mg | DELAYED_RELEASE_CAPSULE | Freq: Every day | ORAL | 0 refills | Status: DC

## 2022-06-05 ENCOUNTER — Encounter: Payer: Self-pay | Admitting: Family Medicine

## 2022-06-05 ENCOUNTER — Other Ambulatory Visit: Payer: Self-pay | Admitting: *Deleted

## 2022-06-05 ENCOUNTER — Other Ambulatory Visit: Payer: Self-pay | Admitting: Family Medicine

## 2022-06-05 MED ORDER — TIZANIDINE HCL 4 MG PO TABS: 4.0000 mg | ORAL_TABLET | Freq: Four times a day (QID) | ORAL | 1 refills | Status: DC | PRN

## 2022-06-10 ENCOUNTER — Telehealth: Payer: Self-pay | Admitting: Family Medicine

## 2022-06-10 ENCOUNTER — Other Ambulatory Visit: Payer: Self-pay | Admitting: Family Medicine

## 2022-06-10 NOTE — Telephone Encounter (Signed)
Pt is calling. Stated she is having problem getting medication  SUMAtriptan (IMITREX) 6 MG/0.5ML SOLN injection . Stated she was told she needed a PA for medication. Pt is requesting a call back from nusr

## 2022-06-10 NOTE — Telephone Encounter (Addendum)
[facility]ed [facility] at [phone]. Spoke w/ tech. Confirmed PA is needed. [facility] [facility]. Their phone# [phone]. Spoke w/ tech. Insurance will only pay for one triptan. She picked up zolmitriptan 06/03/22. Will need to do PA for this reason. Aware we will work on this for pt.  RxBIN: [id number] PCN: [id number] Grp: [id number] ID: [id number]  Called pt to let her know we will work on this. She verbalized understanding.  Initiated PA on covermymeds. Key: [id number]. In process of completing.

## 2022-06-11 NOTE — Telephone Encounter (Signed)
Called and spoke with pt. Advised PA approved and should be able to pick up rx now. She verbalized understanding.

## 2022-06-11 NOTE — Telephone Encounter (Signed)
"  Submitted PA and received the following response: "Your PA request has been approved 06/11/2022 to 06/10/2025"

## 2022-06-13 LAB — HM PAP SMEAR

## 2022-06-24 NOTE — Progress Notes (Unsigned)
Chief Complaint:   OBESITY Alexandria Ward is here to discuss her progress with her obesity treatment plan along with follow-up of her obesity related diagnoses. Alexandria Ward is on following a lower carbohydrate, vegetable and lean protein rich diet plan and states she is following her eating plan approximately 30% of the time. Alexandria Ward states she is doing 0 minutes 0 times per week.  Today's visit was #: 26 Starting weight: 206 lbs Starting date: 07/14/2019 Today's weight: 190 lbs Today's date: 06/04/2022 Total lbs lost to date: 16 Total lbs lost since last in-office visit: 0  Interim History: Alexandria Ward has done some celebration eating recently, but she is working on getting back on track.  Subjective:   1. Prediabetes Alexandria Ward A1c is worsening and has crept up with recent labs, but her glucose and insulin are still controlled.  She notes some increased celebration eating which may have contributed.  I discussed labs with the patient today.  2. Gastroesophageal reflux disease, unspecified whether esophagitis present Alexandria Ward feels omeprazole is helping her symptoms.  No side effects were noted, and she requests a refill today.  3. Vitamin D deficiency Alexandria Ward is on Vitamin D, and her level is at goal.  She denies nausea, vomiting, or muscle weakness.  I discussed labs with the patient today.  4. Hyperlipidemia, pure Alexandria Ward LDL has increased.  She is not on statin, and she is trying to improve with her diet.  Her HDL and triglycerides are at goal.  She denies chest pain.  I discussed labs with the patient today.  Assessment/Plan:   1. Prediabetes Anita is to work on decreasing simple carbohydrates, especially liquids.  We will recheck her labs in 3 months.  2. Gastroesophageal reflux disease, unspecified whether esophagitis present Alexandria Ward will continue omeprazole 40 mg once daily, and we will refill for 1 month.  - omeprazole (PRILOSEC) 40 MG capsule; Take 1 capsule (40 mg total) by mouth daily.   Dispense: 30 capsule; Refill: 0  3. Vitamin D deficiency Alexandria Ward will continue prescription vitamin D 50,000 IU every week, and we will refill for 1 month.  - Vitamin D, Ergocalciferol, (DRISDOL) 1.25 MG (50000 UNIT) CAPS capsule; Take 1 capsule (50,000 Units total) by mouth every 7 (seven) days.  Dispense: 4 capsule; Refill: 0  4. Hyperlipidemia, pure Alexandria Ward declined statin, and she prefers to work on her diet.  She will continue to decrease high cholesterol foods, and we will recheck labs in 3 months.  5. Obesity, Current BMI 35.9 Alexandria Ward is currently in the action stage of change. As such, her goal is to continue with weight loss efforts. She has agreed to following a lower carbohydrate, vegetable and lean protein rich diet plan.   Behavioral modification strategies: increasing lean protein intake and holiday eating strategies .  Alexandria Ward has agreed to follow-up with our clinic in 4 weeks. She was informed of the importance of frequent follow-up visits to maximize her success with intensive lifestyle modifications for her multiple health conditions.   Objective:   Blood pressure 103/70, pulse 74, temperature 98 F (36.7 C), height 5\' 1"  (1.549 m), weight 190 lb (86.2 kg), SpO2 98 %. Body mass index is 35.9 kg/m.  General: Cooperative, alert, well developed, in no acute distress. HEENT: Conjunctivae and lids unremarkable. Cardiovascular: Regular rhythm.  Lungs: Normal work of breathing. Neurologic: No focal deficits.   Lab Results  Component Value Date   CREATININE 0.79 05/13/2022   BUN 20 05/13/2022   NA 142 05/13/2022  K 4.5 05/13/2022   CL 106 05/13/2022   CO2 23 05/13/2022   Lab Results  Component Value Date   ALT 16 05/13/2022   AST 19 05/13/2022   ALKPHOS 81 05/13/2022   BILITOT 0.2 05/13/2022   Lab Results  Component Value Date   HGBA1C 5.7 (H) 05/13/2022   HGBA1C 5.6 12/30/2021   HGBA1C 5.5 08/13/2021   HGBA1C 5.4 04/03/2021   HGBA1C 5.6 08/13/2020   Lab  Results  Component Value Date   INSULIN 6.2 05/13/2022   INSULIN 4.3 12/30/2021   INSULIN 4.4 08/13/2021   INSULIN 7.1 04/03/2021   INSULIN 6.8 08/13/2020   Lab Results  Component Value Date   TSH 2.890 08/13/2021   Lab Results  Component Value Date   CHOL 208 (H) 05/13/2022   HDL 58 05/13/2022   LDLCALC 139 (H) 05/13/2022   TRIG 59 05/13/2022   Lab Results  Component Value Date   VD25OH 68.3 05/13/2022   VD25OH 34.0 12/30/2021   VD25OH 82.3 08/13/2021   Lab Results  Component Value Date   WBC 5.4 12/30/2021   HGB 13.2 12/30/2021   HCT 39.6 12/30/2021   MCV 96 12/30/2021   PLT 271 12/30/2021   No results found for: "IRON", "TIBC", "FERRITIN"  Attestation Statements:   Reviewed by clinician on day of visit: allergies, medications, problem list, medical history, surgical history, family history, social history, and previous encounter notes.   I, Burt Knack, am acting as transcriptionist for Quillian Quince, MD.  I have reviewed the above documentation for accuracy and completeness, and I agree with the above. -  Quillian Quince, MD

## 2022-07-02 ENCOUNTER — Telehealth: Payer: Self-pay | Admitting: Family Medicine

## 2022-07-02 NOTE — Telephone Encounter (Signed)
Please call pt back. 90 days supply was sent in by [place] on 05/1922 [id number] [place]  - [address] AT [address] [place] [address]    She will need to discuss [facility] as to why [facility] only gave her a 30 days supply

## 2022-07-02 NOTE — Telephone Encounter (Signed)
Pt states she requested the Fremanezumab-vfrm (AJOVY) 225 MG/1.5ML SOAJ , as a 90 day when [id number] It was only 1 auto injector.  Pt only wants 90 day, pt asking for a call to discuss this request

## 2022-07-03 ENCOUNTER — Ambulatory Visit (INDEPENDENT_AMBULATORY_CARE_PROVIDER_SITE_OTHER): Payer: 59 | Admitting: Family Medicine

## 2022-07-03 ENCOUNTER — Encounter (INDEPENDENT_AMBULATORY_CARE_PROVIDER_SITE_OTHER): Payer: Self-pay | Admitting: Family Medicine

## 2022-07-03 ENCOUNTER — Other Ambulatory Visit (HOSPITAL_COMMUNITY): Payer: Self-pay

## 2022-07-03 VITALS — BP 103/67 | HR 75 | Ht 61.0 in | Wt 190.0 lb

## 2022-07-03 DIAGNOSIS — E559 Vitamin D deficiency, unspecified: Secondary | ICD-10-CM

## 2022-07-03 DIAGNOSIS — K219 Gastro-esophageal reflux disease without esophagitis: Secondary | ICD-10-CM | POA: Diagnosis not present

## 2022-07-03 DIAGNOSIS — E7849 Other hyperlipidemia: Secondary | ICD-10-CM

## 2022-07-03 DIAGNOSIS — R7303 Prediabetes: Secondary | ICD-10-CM

## 2022-07-03 DIAGNOSIS — Z6835 Body mass index (BMI) 35.0-35.9, adult: Secondary | ICD-10-CM

## 2022-07-03 DIAGNOSIS — E669 Obesity, unspecified: Secondary | ICD-10-CM

## 2022-07-03 DIAGNOSIS — Z6836 Body mass index (BMI) 36.0-36.9, adult: Secondary | ICD-10-CM

## 2022-07-03 MED ORDER — VITAMIN D (ERGOCALCIFEROL) 1.25 MG (50000 UNIT) PO CAPS: 50000.0000 [IU] | ORAL_CAPSULE | ORAL | 0 refills | Status: DC

## 2022-07-03 MED ORDER — OMEPRAZOLE 40 MG PO CPDR: 40.0000 mg | DELAYED_RELEASE_CAPSULE | Freq: Every day | ORAL | 0 refills | Status: DC

## 2022-07-03 NOTE — Telephone Encounter (Signed)
[facility] and was informed that patient insurance only allows for 30 day supply, PA could be done if see if 90 day supply could be approved. I did inform patient we can't promise with PA it will be approved for 90 day supply. Patient verbalized she understood.

## 2022-07-03 NOTE — Telephone Encounter (Signed)
Pt is asking if nurse can please call [facility] and work this out. Stated they are not going to take her word for it.

## 2022-07-03 NOTE — Telephone Encounter (Signed)
Pharmacy Patient Advocate Encounter   Received notification from [facility] that prior authorization for AJOVY (fremanezumab-vfrm) injection 225MG /1.5ML auto-injectors is required/requested.   PA submitted on 07/03/2022 to (ins) [employer] via CoverMyMeds  Key [id number]   Status is pending  Request is for 90DS

## 2022-07-04 ENCOUNTER — Other Ambulatory Visit (HOSPITAL_COMMUNITY): Payer: Self-pay

## 2022-07-04 NOTE — Telephone Encounter (Signed)
Pharmacy Patient Advocate Encounter  Prior Authorization for AJOVY (fremanezumab-vfrm) injection 225MG /1.5ML auto-injectors has been approved.    PA# [id number]   Effective dates: Waiting for the correspondence.  Per test bill : NEXT FILL DT [id number], LAST FILL DT [id number]

## 2022-07-08 NOTE — Progress Notes (Unsigned)
Chief Complaint:   OBESITY Alexandria Ward is here to discuss her progress with her obesity treatment plan along with follow-up of her obesity related diagnoses. Stephnie is on {MWMwtlossportion/plan2:23431} and states she is following her eating plan approximately ***% of the time. Findley states she is *** *** minutes *** times per week.  Today's visit was #: *** Starting weight: *** Starting date: *** Today's weight: *** Today's date: 07/03/2022 Total lbs lost to date: *** Total lbs lost since last in-office visit: ***  Interim History: ***  Subjective:   1. Gastroesophageal reflux disease, unspecified whether esophagitis present ***  2. Vitamin D deficiency ***  3. Hyperlipidemia, pure ***  4. Prediabetes ***  Assessment/Plan:   1. Gastroesophageal reflux disease, unspecified whether esophagitis present *** - omeprazole (PRILOSEC) 40 MG capsule; Take 1 capsule (40 mg total) by mouth daily.  Dispense: 30 capsule; Refill: 0  2. Vitamin D deficiency *** - Vitamin D, Ergocalciferol, (DRISDOL) 1.25 MG (50000 UNIT) CAPS capsule; Take 1 capsule (50,000 Units total) by mouth every 7 (seven) days.  Dispense: 4 capsule; Refill: 0  3. Hyperlipidemia, pure ***  4. Prediabetes ***  5. BMI 36.0-36.9,adult  6. Obesity, Beginning BMI 38.92 Alexandria Ward is currently in the action stage of change. As such, her goal is to continue with weight loss efforts. She has agreed to practicing portion control and making smarter food choices, such as increasing vegetables and decreasing simple carbohydrates.   Behavioral modification strategies: increasing lean protein intake.  Alexandria Ward has agreed to follow-up with our clinic in 4 to 5 weeks. She was informed of the importance of frequent follow-up visits to maximize her success with intensive lifestyle modifications for her multiple health conditions.   Objective:   Blood pressure 103/67, pulse 75, height 5\' 1"  (1.549 m), weight 190 lb (86.2 kg), SpO2  97 %. Body mass index is 35.9 kg/m.  General: Cooperative, alert, well developed, in no acute distress. HEENT: Conjunctivae and lids unremarkable. Cardiovascular: Regular rhythm.  Lungs: Normal work of breathing. Neurologic: No focal deficits.   Lab Results  Component Value Date   CREATININE 0.79 05/13/2022   BUN 20 05/13/2022   NA 142 05/13/2022   K 4.5 05/13/2022   CL 106 05/13/2022   CO2 23 05/13/2022   Lab Results  Component Value Date   ALT 16 05/13/2022   AST 19 05/13/2022   ALKPHOS 81 05/13/2022   BILITOT 0.2 05/13/2022   Lab Results  Component Value Date   HGBA1C 5.7 (H) 05/13/2022   HGBA1C 5.6 12/30/2021   HGBA1C 5.5 08/13/2021   HGBA1C 5.4 04/03/2021   HGBA1C 5.6 08/13/2020   Lab Results  Component Value Date   INSULIN 6.2 05/13/2022   INSULIN 4.3 12/30/2021   INSULIN 4.4 08/13/2021   INSULIN 7.1 04/03/2021   INSULIN 6.8 08/13/2020   Lab Results  Component Value Date   TSH 2.890 08/13/2021   Lab Results  Component Value Date   CHOL 208 (H) 05/13/2022   HDL 58 05/13/2022   LDLCALC 139 (H) 05/13/2022   TRIG 59 05/13/2022   Lab Results  Component Value Date   VD25OH 68.3 05/13/2022   VD25OH 34.0 12/30/2021   VD25OH 82.3 08/13/2021   Lab Results  Component Value Date   WBC 5.4 12/30/2021   HGB 13.2 12/30/2021   HCT 39.6 12/30/2021   MCV 96 12/30/2021   PLT 271 12/30/2021   No results found for: "IRON", "TIBC", "FERRITIN"  Attestation Statements:   Reviewed by  clinician on day of visit: allergies, medications, problem list, medical history, surgical history, family history, social history, and previous encounter notes.   I, Trixie Dredge, am acting as transcriptionist for Dennard Nip, MD.  I have reviewed the above documentation for accuracy and completeness, and I agree with the above. -  ***

## 2022-07-18 ENCOUNTER — Encounter: Payer: Self-pay | Admitting: Family Medicine

## 2022-07-21 ENCOUNTER — Other Ambulatory Visit (HOSPITAL_COMMUNITY): Payer: Self-pay

## 2022-07-22 ENCOUNTER — Other Ambulatory Visit: Payer: Self-pay | Admitting: Family Medicine

## 2022-07-22 MED ORDER — SUMATRIPTAN SUCCINATE 100 MG PO TABS: 100.0000 mg | ORAL_TABLET | ORAL | 3 refills | Status: DC | PRN

## 2022-07-22 NOTE — Telephone Encounter (Signed)
Pt called stating that she was not informed or educated on how to administer the SUMAtriptan (IMITREX) 6 MG/0.5ML SOLN injection pt is out of rescue medication and is needing the SUMAtriptan (IMITREX) 6 MG/0.5ML SOLN injection switched to a non injection form. Please advise.

## 2022-07-31 ENCOUNTER — Ambulatory Visit (INDEPENDENT_AMBULATORY_CARE_PROVIDER_SITE_OTHER): Payer: 59 | Admitting: Family Medicine

## 2022-07-31 ENCOUNTER — Encounter (INDEPENDENT_AMBULATORY_CARE_PROVIDER_SITE_OTHER): Payer: Self-pay | Admitting: Family Medicine

## 2022-07-31 VITALS — BP 98/55 | HR 73 | Temp 98.0°F | Ht 61.0 in | Wt 190.0 lb

## 2022-07-31 DIAGNOSIS — Z6836 Body mass index (BMI) 36.0-36.9, adult: Secondary | ICD-10-CM | POA: Diagnosis not present

## 2022-07-31 DIAGNOSIS — E559 Vitamin D deficiency, unspecified: Secondary | ICD-10-CM

## 2022-07-31 DIAGNOSIS — E669 Obesity, unspecified: Secondary | ICD-10-CM | POA: Diagnosis not present

## 2022-07-31 DIAGNOSIS — K219 Gastro-esophageal reflux disease without esophagitis: Secondary | ICD-10-CM | POA: Diagnosis not present

## 2022-07-31 MED ORDER — OMEPRAZOLE 40 MG PO CPDR: 40.0000 mg | DELAYED_RELEASE_CAPSULE | Freq: Every day | ORAL | 0 refills | Status: DC

## 2022-07-31 MED ORDER — VITAMIN D (ERGOCALCIFEROL) 1.25 MG (50000 UNIT) PO CAPS: 50000.0000 [IU] | ORAL_CAPSULE | ORAL | 0 refills | Status: DC

## 2022-08-07 ENCOUNTER — Other Ambulatory Visit: Payer: Self-pay | Admitting: Orthopedic Surgery

## 2022-08-07 DIAGNOSIS — M545 Low back pain, unspecified: Secondary | ICD-10-CM

## 2022-08-18 NOTE — Progress Notes (Signed)
Chief Complaint:   OBESITY Alexandria Ward is here to discuss her progress with her obesity treatment plan along with follow-up of her obesity related diagnoses. Alexandria Ward is on practicing portion control and making smarter food choices, such as increasing vegetables and decreasing simple carbohydrates and states she is following her eating plan approximately 30-40% of the time. Alexandria Ward states she is doing 0 minutes 0 times per week.  Today's visit was #: 37 Starting weight: 206 lbs Starting date: 07/14/2019 Today's weight: 190 lbs Today's date: 07/31/2022 Total lbs lost to date: 16 Total lbs lost since last in-office visit: 0  Interim History: Alexandria Ward has done well with maintaining her weight in the last month.  She is doing portion control and making smarter choices to maintain her weight.  Subjective:   1. Gastroesophageal reflux disease, unspecified whether esophagitis present Alexandria Ward is on omeprazole, but she has been having more GI distress in the last 2 weeks.  She doubled her omeprazole for 2-3 times and she felt this helped a little bit.  She is very busy with her mother and she wants to wait to see her GI doctor.  2. Vitamin D deficiency Alexandria Ward is on vitamin D prescription with no side effects noted.  Her last vitamin D level was at goal.  Assessment/Plan:   1. Gastroesophageal reflux disease, unspecified whether esophagitis present Alexandria Ward will continue omeprazole, and we will refill for 1 month.  She is okay to add Tums as needed but if symptoms continue she really needs to follow-up with GI.  - omeprazole (PRILOSEC) 40 MG capsule; Take 1 capsule (40 mg total) by mouth daily.  Dispense: 30 capsule; Refill: 0  2. Vitamin D deficiency Alexandria Ward will continue prescription vitamin D, and we will refill for 1 month.  - Vitamin D, Ergocalciferol, (DRISDOL) 1.25 MG (50000 UNIT) CAPS capsule; Take 1 capsule (50,000 Units total) by mouth every 7 (seven) days.  Dispense: 4 capsule; Refill: 0  3. BMI  36.0-36.9,adult  4. Obesity, Beginning BMI 38.92 Alexandria Ward is currently in the action stage of change. As such, her goal is to continue with weight loss efforts. She has agreed to practicing portion control and making smarter food choices, such as increasing vegetables and decreasing simple carbohydrates.   Exercise goals: For substantial health benefits, adults should do at least 150 minutes (2 hours and 30 minutes) a week of moderate-intensity, or 75 minutes (1 hour and 15 minutes) a week of vigorous-intensity aerobic physical activity, or an equivalent combination of moderate- and vigorous-intensity aerobic activity. Aerobic activity should be performed in episodes of at least 10 minutes, and preferably, it should be spread throughout the week.  Behavioral modification strategies: increasing lean protein intake.  Alexandria Ward has agreed to follow-up with our clinic in 4 weeks. She was informed of the importance of frequent follow-up visits to maximize her success with intensive lifestyle modifications for her multiple health conditions.   Objective:   Blood pressure (!) 98/55, pulse 73, temperature 98 F (36.7 C), height '5\' 1"'$  (1.549 m), weight 190 lb (86.2 kg), SpO2 97 %. Body mass index is 35.9 kg/m.  General: Cooperative, alert, well developed, in no acute distress. HEENT: Conjunctivae and lids unremarkable. Cardiovascular: Regular rhythm.  Lungs: Normal work of breathing. Neurologic: No focal deficits.   Lab Results  Component Value Date   CREATININE 0.79 05/13/2022   BUN 20 05/13/2022   NA 142 05/13/2022   K 4.5 05/13/2022   CL 106 05/13/2022   CO2 23 05/13/2022  Lab Results  Component Value Date   ALT 16 05/13/2022   AST 19 05/13/2022   ALKPHOS 81 05/13/2022   BILITOT 0.2 05/13/2022   Lab Results  Component Value Date   HGBA1C 5.7 (H) 05/13/2022   HGBA1C 5.6 12/30/2021   HGBA1C 5.5 08/13/2021   HGBA1C 5.4 04/03/2021   HGBA1C 5.6 08/13/2020   Lab Results  Component  Value Date   INSULIN 6.2 05/13/2022   INSULIN 4.3 12/30/2021   INSULIN 4.4 08/13/2021   INSULIN 7.1 04/03/2021   INSULIN 6.8 08/13/2020   Lab Results  Component Value Date   TSH 2.890 08/13/2021   Lab Results  Component Value Date   CHOL 208 (H) 05/13/2022   HDL 58 05/13/2022   LDLCALC 139 (H) 05/13/2022   TRIG 59 05/13/2022   Lab Results  Component Value Date   VD25OH 68.3 05/13/2022   VD25OH 34.0 12/30/2021   VD25OH 82.3 08/13/2021   Lab Results  Component Value Date   WBC 5.4 12/30/2021   HGB 13.2 12/30/2021   HCT 39.6 12/30/2021   MCV 96 12/30/2021   PLT 271 12/30/2021   No results found for: "IRON", "TIBC", "FERRITIN"  Attestation Statements:   Reviewed by clinician on day of visit: allergies, medications, problem list, medical history, surgical history, family history, social history, and previous encounter notes.   I, Trixie Dredge, am acting as transcriptionist for Dennard Nip, MD.  I have reviewed the above documentation for accuracy and completeness, and I agree with the above. -  Dennard Nip, MD

## 2022-09-04 ENCOUNTER — Ambulatory Visit (INDEPENDENT_AMBULATORY_CARE_PROVIDER_SITE_OTHER): Payer: 59 | Admitting: Family Medicine

## 2022-09-18 ENCOUNTER — Ambulatory Visit (INDEPENDENT_AMBULATORY_CARE_PROVIDER_SITE_OTHER): Payer: 59 | Admitting: Family Medicine

## 2022-10-02 ENCOUNTER — Telehealth: Payer: Self-pay | Admitting: Family Medicine

## 2022-10-02 NOTE — Telephone Encounter (Signed)
Pt is needing to speak to the RN regarding some information that is needing to be sent in order for her to receive a 3 month supply of her  Fremanezumab-vfrm (AJOVY) 225 MG/1.5ML SOAJ  Please advise.

## 2022-10-02 NOTE — Telephone Encounter (Signed)
[phone]. Spoke w/ tech. Pt last refilled 02/25 1.3ml/30days. They had old rx on file for 1.60ml/30 days that they were using.   Advised pt wanting 4.44ml/90 days. We sent in rx 06/03/22 #4.44ml, 3 refills. They were able to locate this. They have to order and should arrive tomorrow. Pt can f/u with the pharmacy tomorrow on status.

## 2022-10-02 NOTE — Telephone Encounter (Signed)
Called pt. Informed her of message nurse [facility] sent. Pt said I sure do appreciate that.

## 2022-10-08 LAB — COMPREHENSIVE METABOLIC PANEL: EGFR: 100

## 2022-10-14 ENCOUNTER — Encounter: Payer: Self-pay | Admitting: Internal Medicine

## 2022-10-14 ENCOUNTER — Ambulatory Visit: Payer: 59 | Admitting: Internal Medicine

## 2022-10-14 VITALS — BP 110/70 | HR 76 | Temp 98.1°F | Ht 61.0 in | Wt 192.2 lb

## 2022-10-14 DIAGNOSIS — G43001 Migraine without aura, not intractable, with status migrainosus: Secondary | ICD-10-CM

## 2022-10-14 DIAGNOSIS — E559 Vitamin D deficiency, unspecified: Secondary | ICD-10-CM | POA: Diagnosis not present

## 2022-10-14 DIAGNOSIS — E782 Mixed hyperlipidemia: Secondary | ICD-10-CM | POA: Diagnosis not present

## 2022-10-14 DIAGNOSIS — Z6838 Body mass index (BMI) 38.0-38.9, adult: Secondary | ICD-10-CM

## 2022-10-14 DIAGNOSIS — E039 Hypothyroidism, unspecified: Secondary | ICD-10-CM

## 2022-10-14 NOTE — Progress Notes (Unsigned)
New Patient Office Visit     CC/Reason for Visit: Establish care, discuss chronic conditions Previous PCP: Fatima Sanger, NP Last Visit: Recently  HPI: Alexandria Ward is a 57 y.o. female who is coming in today for the above mentioned reasons. Past Medical History is significant for: Hypothyroidism, vitamin D deficiency, obesity, hyperlipidemia not on medication, GERD, migraine headaches.  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  She sees neurology for her migraine headaches.  She recently had her physical and brings labs which I have summarized below and will abstracted into her chart.  Cancer screening is up-to-date, will obtain records.   Past Medical/Surgical History: Past Medical History:  Diagnosis Date   Anemia    Asthma    Back pain    Constipation    Depression    Dyspnea    Fatigue    Fluid retention    Hyperlipidemia    Hypothyroidism    IBS (irritable bowel syndrome)    Joint pain    Lower extremity edema    Migraines    Palpitations    Shortness of breath    Tachycardia    Thyroid disease    Vitamin B 12 deficiency    Vitamin D deficiency     Past Surgical History:  Procedure Laterality Date   APPENDECTOMY     endometrial ablasion      Social History:  reports that she has never smoked. She has never used smokeless tobacco. She reports current alcohol use. She reports that she does not use drugs.  Allergies: Allergies  Allergen Reactions   Betadine [Povidone Iodine]    Azithromycin Rash   Ciprofloxacin Rash   Penicillins Rash    Cant remember the reaction or the severity of it   Tape Rash    Family History:  Family History  Problem Relation Age of Onset   Restless legs syndrome Mother    Arthritis Mother    Neuropathy Mother    Hyperlipidemia Mother    Depression Mother    Heart attack Father      Current Outpatient Medications:    Digestive Enzymes (DIGESTIVE ENZYME PO), Take 500 mg by mouth., Disp: , Rfl:     fluticasone (FLONASE) 50 MCG/ACT nasal spray, Place into the nose., Disp: , Rfl:    Fremanezumab-vfrm (AJOVY) 225 MG/1.5ML SOAJ, Inject 225 mg into the skin every 30 (thirty) days., Disp: 4.5 mL, Rfl: 3   magnesium gluconate (MAGONATE) 500 MG tablet, Take 500 mg by mouth daily., Disp: , Rfl:    Multiple Vitamins-Minerals (HAIR/SKIN/NAILS/BIOTIN PO), Take 1 tablet by mouth daily., Disp: , Rfl:    omeprazole (PRILOSEC) 40 MG capsule, Take 1 capsule (40 mg total) by mouth daily., Disp: 30 capsule, Rfl: 0   ondansetron (ZOFRAN ODT) 4 MG disintegrating tablet, Take 1 tablet (4 mg total) by mouth every 8 (eight) hours as needed., Disp: 20 tablet, Rfl: 1   SUMAtriptan (IMITREX) 100 MG tablet, Take 1 tablet (100 mg total) by mouth every 2 (two) hours as needed for migraine. May repeat in 2 hours if headache persists or recurs. Do not exceed 2 does in 24 hours or 10 doses in 1 month., Disp: 27 tablet, Rfl: 3   thyroid (ARMOUR) 15 MG tablet, Take 15 mg by mouth daily., Disp: , Rfl:    Vitamin D, Ergocalciferol, (DRISDOL) 1.25 MG (50000 UNIT) CAPS capsule, Take 1 capsule (50,000 Units total) by mouth every 7 (seven) days., Disp: 4 capsule, Rfl:  0   zolmitriptan (ZOMIG) 5 MG tablet, Take 1 tablet (5 mg total) by mouth as needed for migraine., Disp: 10 tablet, Rfl: 1  Review of Systems:  Negative except as indicated in HPI.   Physical Exam: Vitals:   10/14/22 1500  BP: 110/70  Pulse: 76  Temp: 98.1 F (36.7 C)  TempSrc: Oral  SpO2: 95%  Weight: 192 lb 3.2 oz (87.2 kg)  Height: 5\' 1"  (1.549 m)   Body mass index is 36.32 kg/m.  Physical Exam Vitals reviewed.  Constitutional:      Appearance: Normal appearance.  HENT:     Head: Normocephalic and atraumatic.  Eyes:     Conjunctiva/sclera: Conjunctivae normal.     Pupils: Pupils are equal, round, and reactive to light.  Cardiovascular:     Rate and Rhythm: Normal rate and regular rhythm.  Pulmonary:     Effort: Pulmonary effort is normal.      Breath sounds: Normal breath sounds.  Skin:    General: Skin is warm and dry.  Neurological:     General: No focal deficit present.     Mental Status: She is alert and oriented to person, place, and time.  Psychiatric:        Mood and Affect: Mood normal.        Behavior: Behavior normal.        Thought Content: Thought content normal.        Judgment: Judgment normal.       Impression and Plan:  Mixed hyperlipidemia Assessment & Plan: Recent LDL 126. She prefers to not be on a statin. We have discussed importance of lifestyle changes.   Vitamin D deficiency Assessment & Plan: Recent levels reviewed and in range.   Migraine without aura and with status migrainosus, not intractable Assessment & Plan: This is her main issue. Is followed by neurology, Dr. Vickey Huger. Fairly well controlled on Ajovy and triptans.   Class 2 severe obesity due to excess calories with serious comorbidity and body mass index (BMI) of 38.0 to 38.9 in adult [facility]) Assessment & Plan: -Discussed healthy lifestyle, including increased physical activity and better food choices to promote weight loss.    Hypothyroidism (acquired) Assessment & Plan: Recent TSH in normal range. Continue current dose of Armor Thyroid.     Time spent: 46 minutes reviewing chart, interviewing and examining patient, abstracting labs and formulating plan of care.    Chaya Jan, MD [facility]

## 2022-10-14 NOTE — Assessment & Plan Note (Signed)
-  Discussed healthy lifestyle, including increased physical activity and better food choices to promote weight loss.

## 2022-10-14 NOTE — Assessment & Plan Note (Signed)
Recent LDL 126. She prefers to not be on a statin. We have discussed importance of lifestyle changes.

## 2022-10-14 NOTE — Assessment & Plan Note (Signed)
Recent levels reviewed and in range.

## 2022-10-14 NOTE — Assessment & Plan Note (Signed)
This is her main issue. Is followed by neurology, Dr. Vickey Huger. Fairly well controlled on Ajovy and triptans.

## 2022-10-14 NOTE — Assessment & Plan Note (Signed)
Recent TSH in normal range. Continue current dose of Armor Thyroid.

## 2022-10-15 ENCOUNTER — Encounter: Payer: Self-pay | Admitting: Internal Medicine

## 2022-10-16 ENCOUNTER — Ambulatory Visit (INDEPENDENT_AMBULATORY_CARE_PROVIDER_SITE_OTHER): Payer: 59 | Admitting: Family Medicine

## 2022-11-18 ENCOUNTER — Ambulatory Visit (INDEPENDENT_AMBULATORY_CARE_PROVIDER_SITE_OTHER): Payer: 59 | Admitting: Family Medicine

## 2022-11-18 ENCOUNTER — Encounter (INDEPENDENT_AMBULATORY_CARE_PROVIDER_SITE_OTHER): Payer: Self-pay | Admitting: Family Medicine

## 2022-11-18 VITALS — BP 99/66 | HR 90 | Temp 98.8°F | Ht 61.0 in | Wt 187.0 lb

## 2022-11-18 DIAGNOSIS — Z6835 Body mass index (BMI) 35.0-35.9, adult: Secondary | ICD-10-CM

## 2022-11-18 DIAGNOSIS — E669 Obesity, unspecified: Secondary | ICD-10-CM

## 2022-11-18 DIAGNOSIS — R7303 Prediabetes: Secondary | ICD-10-CM

## 2022-11-18 DIAGNOSIS — E559 Vitamin D deficiency, unspecified: Secondary | ICD-10-CM

## 2022-11-18 DIAGNOSIS — E860 Dehydration: Secondary | ICD-10-CM | POA: Diagnosis not present

## 2022-11-18 DIAGNOSIS — Z6379 Other stressful life events affecting family and household: Secondary | ICD-10-CM

## 2022-11-18 MED ORDER — VITAMIN D (ERGOCALCIFEROL) 1.25 MG (50000 UNIT) PO CAPS
50000.0000 [IU] | ORAL_CAPSULE | ORAL | 0 refills | Status: DC
Start: 2022-11-18 — End: 2022-12-25

## 2022-11-18 NOTE — Progress Notes (Signed)
.smr  Office: [phone]  /  Fax: [phone]  WEIGHT SUMMARY AND BIOMETRICS  Anthropometric Measurements Height: 5\' 1"  (1.549 m) Weight: 187 lb (84.8 kg) BMI (Calculated): 35.35 Weight at Last Visit: 190lb Weight Lost Since Last Visit: 3lb Weight Gained Since Last Visit: 0lb   Body Composition  Body Fat %: 45.4 % Fat Mass (lbs): 84.8 lbs Muscle Mass (lbs): 97 lbs Total Body Water (lbs): 74.8 lbs Visceral Fat Rating : 13   Other Clinical Data Fasting: no Labs: no Today's Visit #: 41  Discussed the use of AI scribe software for clinical note transcription with the patient, who gave verbal consent to proceed.  Chief Complaint: OBESITY  Alexandria Ward is here to discuss her progress with her obesity treatment plan. She is on the practicing portion control and making smarter food choices, such as increasing vegetables and decreasing simple carbohydrates and states she is following her eating plan approximately 30 % of the time. She states she is exercising 0 minutes 0 times per week.   History of Present Illness   The patient, with a history of prediabetes and recent bilateral carpal tunnel surgeries, has been managing multiple stressors including personal health issues and caring for their mother who recently underwent back surgery. They report significant fatigue, stating this has been one of the most difficult times they've experienced. They also mention increased thirst and occasional lightheadedness, which may suggest dehydration.  The patient has been trying to maintain a balanced diet amidst these challenges, incorporating protein-rich foods like chicken from [employer]. However, they express difficulty in maintaining regular meal times due to their current circumstances. They also report a recent change in their tolerance to heat, which they find unusual as they typically enjoy the summer weather.  In terms of their mother's health, the patient has been actively involved in her  care following her back surgery. They report that their mother experienced delirium post-operatively, likely due to the hydrocodone prescribed for pain management. The patient has been managing these challenges while also caring for their mother's dog, adding to their stress and fatigue.  Despite these challenges, the patient has been able to maintain their weight and their last hemoglobin A1c was 5.7, indicating prediabetes but not diabetes. They express a desire to be more active once their current circumstances stabilize.          PHYSICAL EXAM:  Blood pressure 99/66, pulse 90, temperature 98.8 F (37.1 C), height 5\' 1"  (1.549 m), weight 187 lb (84.8 kg), SpO2 97 %. Body mass index is 35.33 kg/m.  DIAGNOSTIC DATA REVIEWED:  BMET    Component Value Date/Time   NA 142 05/13/2022 0817   K 4.5 05/13/2022 0817   CL 106 05/13/2022 0817   CO2 23 05/13/2022 0817   GLUCOSE 79 05/13/2022 0817   BUN 20 05/13/2022 0817   CREATININE 0.79 05/13/2022 0817   CALCIUM 10.2 05/13/2022 0817   GFRNONAA 68.67 04/24/2020 0000   GFRAA 83.10 04/24/2020 0000   Lab Results  Component Value Date   HGBA1C 5.7 (H) 05/13/2022   HGBA1C 5.8 (H) 07/14/2019   Lab Results  Component Value Date   INSULIN 6.2 05/13/2022   INSULIN 12.3 07/14/2019   Lab Results  Component Value Date   TSH 2.890 08/13/2021   CBC    Component Value Date/Time   WBC 5.4 12/30/2021 0906   RBC 4.13 12/30/2021 0906   RBC 3.93 10/03/2021 0000   HGB 13.2 12/30/2021 0906   HCT 39.6 12/30/2021 0906  PLT 271 12/30/2021 0906   MCV 96 12/30/2021 0906   MCH 32.0 12/30/2021 0906   MCHC 33.3 12/30/2021 0906   RDW 12.7 12/30/2021 0906   Iron Studies No results found for: "IRON", "TIBC", "FERRITIN", "IRONPCTSAT" Lipid Panel     Component Value Date/Time   CHOL 208 (H) 05/13/2022 0817   TRIG 59 05/13/2022 0817   HDL 58 05/13/2022 0817   LDLCALC 139 (H) 05/13/2022 0817   Hepatic Function Panel     Component Value  Date/Time   PROT 6.6 05/13/2022 0817   ALBUMIN 4.3 05/13/2022 0817   AST 19 05/13/2022 0817   ALT 16 05/13/2022 0817   ALKPHOS 81 05/13/2022 0817   BILITOT 0.2 05/13/2022 0817      Component Value Date/Time   TSH 2.890 08/13/2021 0816   Nutritional Lab Results  Component Value Date   VD25OH 68.3 05/13/2022   VD25OH 34.0 12/30/2021   VD25OH 41.1 10/03/2021     Assessment and Plan    Prediabetes: Last HbA1c was 5.7, indicating continued prediabetes. Patient has been under significant stress and has had disruptions to her routine due to personal and family health issues, which may be impacting her ability to manage her prediabetes effectively. -Encourage patient to increase physical activity as her schedule allows. -Plan to check fasting labs, including HbA1c, at next visit in 4 weeks.  Dehydration: Patient reports feeling thirsty frequently and occasionally experiencing lightheadedness and headaches, which may be indicative of dehydration. Patient acknowledges she may not be drinking enough water. -Advise patient to increase fluid intake, aiming for regular intervals throughout the day. -Suggest setting reminders to drink water, either on a work calendar or phone.  Vitamin D deficiency: Last level was 68 in November, indicating good control. However, patient's prescription needs to be refilled. -Refill Vitamin D prescription. -Plan to check Vitamin D level at next visit in 4 weeks.  Family stress: Patient's mother recently underwent back surgery and has been in rehab. Patient has been involved in her care, which has added to her stress. -Encourage patient to seek support as needed during this time.  Follow-up: Schedule a follow-up appointment in 4 weeks. Plan to check fasting labs, including HbA1c and Vitamin D, at that time.         I have personally spent 40 minutes total time today in preparation, patient care, and documentation for this visit, including the following:  review of clinical lab tests; review of medical tests/procedures/services.    She was informed of the importance of frequent follow up visits to maximize her success with intensive lifestyle modifications for her multiple health conditions. Return in about 4 weeks (around 12/16/2022).   Quillian Quince, MD

## 2022-11-19 ENCOUNTER — Encounter (INDEPENDENT_AMBULATORY_CARE_PROVIDER_SITE_OTHER): Payer: 59 | Admitting: Family Medicine

## 2022-12-09 ENCOUNTER — Other Ambulatory Visit (INDEPENDENT_AMBULATORY_CARE_PROVIDER_SITE_OTHER): Payer: Self-pay | Admitting: Family Medicine

## 2022-12-09 DIAGNOSIS — E559 Vitamin D deficiency, unspecified: Secondary | ICD-10-CM

## 2022-12-25 ENCOUNTER — Encounter (INDEPENDENT_AMBULATORY_CARE_PROVIDER_SITE_OTHER): Payer: Self-pay | Admitting: Family Medicine

## 2022-12-25 ENCOUNTER — Ambulatory Visit (INDEPENDENT_AMBULATORY_CARE_PROVIDER_SITE_OTHER): Payer: 59 | Admitting: Family Medicine

## 2022-12-25 VITALS — BP 100/67 | HR 75 | Temp 98.0°F | Ht 61.0 in | Wt 186.0 lb

## 2022-12-25 DIAGNOSIS — R7303 Prediabetes: Secondary | ICD-10-CM | POA: Diagnosis not present

## 2022-12-25 DIAGNOSIS — Z6379 Other stressful life events affecting family and household: Secondary | ICD-10-CM

## 2022-12-25 DIAGNOSIS — E669 Obesity, unspecified: Secondary | ICD-10-CM | POA: Diagnosis not present

## 2022-12-25 DIAGNOSIS — E559 Vitamin D deficiency, unspecified: Secondary | ICD-10-CM

## 2022-12-25 DIAGNOSIS — Z6835 Body mass index (BMI) 35.0-35.9, adult: Secondary | ICD-10-CM

## 2022-12-25 DIAGNOSIS — E7849 Other hyperlipidemia: Secondary | ICD-10-CM | POA: Diagnosis not present

## 2022-12-25 MED ORDER — VITAMIN D (ERGOCALCIFEROL) 1.25 MG (50000 UNIT) PO CAPS
50000.0000 [IU] | ORAL_CAPSULE | ORAL | 0 refills | Status: DC
Start: 2022-12-25 — End: 2023-02-12

## 2022-12-25 NOTE — Progress Notes (Addendum)
.smr  Office: [phone]  /  Fax: [phone]  WEIGHT SUMMARY AND BIOMETRICS  Anthropometric Measurements Height: 5\' 1"  (1.549 m) Weight: 186 lb (84.4 kg) BMI (Calculated): 35.16 Weight at Last Visit: 187 lb Weight Lost Since Last Visit: 1 lb Weight Gained Since Last Visit: 0 Starting Weight: 206 lb Total Weight Loss (lbs): 20 lb (9.072 kg)   Body Composition  Body Fat %: 45.2 % Fat Mass (lbs): 84 lbs Muscle Mass (lbs): 96.8 lbs Total Body Water (lbs): 72 lbs Visceral Fat Rating : 13   Other Clinical Data Fasting: Yes Labs: Yes Today's Visit #: 42    Chief Complaint: OBESITY  History of Present Illness   The patient, known for obesity, vitamin D deficiency, prediabetes, and hyperlipidemia, presents today for a routine follow-up. She reports a weight loss of one pound over the last four weeks, achieved by maintaining a journal and counting steps as part of her exercise plan. She has been managing her cholesterol levels through diet and is not on a statin.  The patient expresses feeling exhausted due to the stress of caregiving for her mother, who recently underwent back surgery and is reportedly non-compliant with her recovery regimen. This has resulted in significant emotional distress and sleep disturbances for the patient.  Despite these challenges, the patient has been able to maintain some self-care practices, such as keeping her nails groomed. However, she expresses concern about her ability to manage her health effectively amidst her caregiving responsibilities and work commitments.  She has been fasting in preparation for these tests.          PHYSICAL EXAM:  Blood pressure 100/67, pulse 75, temperature 98 F (36.7 C), height 5\' 1"  (1.549 m), weight 186 lb (84.4 kg), SpO2 98%. Body mass index is 35.14 kg/m.  DIAGNOSTIC DATA REVIEWED:  BMET    Component Value Date/Time   NA 142 05/13/2022 0817   K 4.5 05/13/2022 0817   CL 106 05/13/2022 0817   CO2  23 05/13/2022 0817   GLUCOSE 79 05/13/2022 0817   BUN 20 05/13/2022 0817   CREATININE 0.79 05/13/2022 0817   CALCIUM 10.2 05/13/2022 0817   GFRNONAA 68.67 04/24/2020 0000   GFRAA 83.10 04/24/2020 0000   Lab Results  Component Value Date   HGBA1C 5.7 (H) 05/13/2022   HGBA1C 5.8 (H) 07/14/2019   Lab Results  Component Value Date   INSULIN 6.2 05/13/2022   INSULIN 12.3 07/14/2019   Lab Results  Component Value Date   TSH 2.890 08/13/2021   CBC    Component Value Date/Time   WBC 5.4 12/30/2021 0906   RBC 4.13 12/30/2021 0906   RBC 3.93 10/03/2021 0000   HGB 13.2 12/30/2021 0906   HCT 39.6 12/30/2021 0906   PLT 271 12/30/2021 0906   MCV 96 12/30/2021 0906   MCH 32.0 12/30/2021 0906   MCHC 33.3 12/30/2021 0906   RDW 12.7 12/30/2021 0906   Iron Studies No results found for: "IRON", "TIBC", "FERRITIN", "IRONPCTSAT" Lipid Panel     Component Value Date/Time   CHOL 208 (H) 05/13/2022 0817   TRIG 59 05/13/2022 0817   HDL 58 05/13/2022 0817   LDLCALC 139 (H) 05/13/2022 0817   Hepatic Function Panel     Component Value Date/Time   PROT 6.6 05/13/2022 0817   ALBUMIN 4.3 05/13/2022 0817   AST 19 05/13/2022 0817   ALT 16 05/13/2022 0817   ALKPHOS 81 05/13/2022 0817   BILITOT 0.2 05/13/2022 0817  Component Value Date/Time   TSH 2.890 08/13/2021 0816   Nutritional Lab Results  Component Value Date   VD25OH 68.3 05/13/2022   VD25OH 34.0 12/30/2021   VD25OH 41.1 10/03/2021     Assessment and Plan    Obesity: One pound weight loss over the last four weeks with partial adherence to journaling and step counting. High stress levels due to caregiving responsibilities. -Continue current weight loss strategies including journaling and step counting. -Consider stress management strategies to support overall health and weight loss efforts.  Vitamin D deficiency: Currently on supplementation, due for labs today to monitor levels. -Continue current Vitamin D  supplementation. -Check Vitamin D levels today. -If levels are approaching 100, will advise to hold off on supplementation and recheck in 3-4 months.  Prediabetes: Due for labs today to monitor status. -Check A1c, glucose, and insulin levels today.  Hyperlipidemia: Currently controlled with diet, not on statin therapy. -Check cholesterol levels today. -Continue dietary management.  Caregiver stress: High levels of stress due to caregiving responsibilities for mother. -Consider strategies for stress management and self-care. -Consider discussing situation with mother's doctor for additional support and potential care plan adjustments.  Follow-up in 8 weeks, or sooner if needed.         I have personally spent 40 minutes total time today in preparation, patient care, and documentation for this visit, including the following: review of clinical lab tests; review of medical tests/procedures/services.    She was informed of the importance of frequent follow up visits to maximize her success with intensive lifestyle modifications for her multiple health conditions.    Quillian Quince, MD

## 2022-12-26 LAB — CMP14+EGFR
ALT: 18 IU/L (ref 0–32)
AST: 17 IU/L (ref 0–40)
Albumin: 4.3 g/dL (ref 3.8–4.9)
Alkaline Phosphatase: 69 IU/L (ref 44–121)
BUN/Creatinine Ratio: 19 (ref 9–23)
BUN: 14 mg/dL (ref 6–24)
Bilirubin Total: 0.3 mg/dL (ref 0.0–1.2)
CO2: 24 mmol/L (ref 20–29)
Calcium: 10.1 mg/dL (ref 8.7–10.2)
Chloride: 105 mmol/L (ref 96–106)
Creatinine, Ser: 0.74 mg/dL (ref 0.57–1.00)
Globulin, Total: 2.1 g/dL (ref 1.5–4.5)
Glucose: 84 mg/dL (ref 70–99)
Potassium: 4.8 mmol/L (ref 3.5–5.2)
Sodium: 144 mmol/L (ref 134–144)
Total Protein: 6.4 g/dL (ref 6.0–8.5)
eGFR: 94 mL/min/{1.73_m2} (ref >59)

## 2022-12-26 LAB — LIPID PANEL WITH LDL/HDL RATIO
Cholesterol, Total: 223 mg/dL — ABNORMAL HIGH (ref 100–199)
HDL: 53 mg/dL (ref >39)
LDL Chol Calc (NIH): 157 mg/dL — ABNORMAL HIGH (ref 0–99)
LDL/HDL Ratio: 3 ratio (ref 0.0–3.2)
Triglycerides: 76 mg/dL (ref 0–149)
VLDL Cholesterol Cal: 13 mg/dL (ref 5–40)

## 2022-12-26 LAB — INSULIN, RANDOM: INSULIN: 7.6 u[IU]/mL (ref 2.6–24.9)

## 2022-12-26 LAB — VITAMIN D 25 HYDROXY (VIT D DEFICIENCY, FRACTURES): Vit D, 25-Hydroxy: 46 ng/mL (ref 30.0–100.0)

## 2022-12-26 LAB — HEMOGLOBIN A1C
Est. average glucose Bld gHb Est-mCnc: 117 mg/dL
Hgb A1c MFr Bld: 5.7 % — ABNORMAL HIGH (ref 4.8–5.6)

## 2023-01-23 ENCOUNTER — Other Ambulatory Visit: Payer: Self-pay | Admitting: Orthopaedic Surgery

## 2023-01-23 DIAGNOSIS — M5441 Lumbago with sciatica, right side: Secondary | ICD-10-CM

## 2023-02-09 ENCOUNTER — Encounter: Payer: Self-pay | Admitting: Orthopaedic Surgery

## 2023-02-11 ENCOUNTER — Encounter: Payer: Self-pay | Admitting: Orthopaedic Surgery

## 2023-02-12 ENCOUNTER — Ambulatory Visit (INDEPENDENT_AMBULATORY_CARE_PROVIDER_SITE_OTHER): Payer: 59 | Admitting: Family Medicine

## 2023-02-12 ENCOUNTER — Encounter: Payer: Self-pay | Admitting: Orthopaedic Surgery

## 2023-02-12 ENCOUNTER — Encounter (INDEPENDENT_AMBULATORY_CARE_PROVIDER_SITE_OTHER): Payer: Self-pay | Admitting: Family Medicine

## 2023-02-12 VITALS — BP 91/57 | HR 80 | Temp 97.7°F | Ht 61.0 in | Wt 186.0 lb

## 2023-02-12 DIAGNOSIS — Z6835 Body mass index (BMI) 35.0-35.9, adult: Secondary | ICD-10-CM

## 2023-02-12 DIAGNOSIS — R7303 Prediabetes: Secondary | ICD-10-CM

## 2023-02-12 DIAGNOSIS — E559 Vitamin D deficiency, unspecified: Secondary | ICD-10-CM

## 2023-02-12 DIAGNOSIS — E039 Hypothyroidism, unspecified: Secondary | ICD-10-CM

## 2023-02-12 DIAGNOSIS — R5383 Other fatigue: Secondary | ICD-10-CM

## 2023-02-12 DIAGNOSIS — E669 Obesity, unspecified: Secondary | ICD-10-CM

## 2023-02-12 DIAGNOSIS — Z636 Dependent relative needing care at home: Secondary | ICD-10-CM

## 2023-02-12 MED ORDER — VITAMIN D (ERGOCALCIFEROL) 1.25 MG (50000 UNIT) PO CAPS
50000.0000 [IU] | ORAL_CAPSULE | ORAL | 0 refills | Status: DC
Start: 2023-02-12 — End: 2023-04-15

## 2023-02-12 NOTE — Progress Notes (Signed)
.smr  Office: [phone]  /  Fax: [phone]  WEIGHT SUMMARY AND BIOMETRICS  Anthropometric Measurements Height: 5\' 1"  (1.549 m) Weight: 186 lb (84.4 kg) BMI (Calculated): 35.16 Weight at Last Visit: 186 lb Weight Lost Since Last Visit: 0 Weight Gained Since Last Visit: 0 Starting Weight: 206 lb Total Weight Loss (lbs): 20 lb (9.072 kg)   Body Composition  Body Fat %: 43.8 % Fat Mass (lbs): 81.4 lbs Muscle Mass (lbs): 99.2 lbs Total Body Water (lbs): 72.8 lbs Visceral Fat Rating : 12   Other Clinical Data Fasting: No Labs: No Today's Visit #: 26    Chief Complaint: OBESITY   Discussed the use of AI scribe software for clinical note transcription with the patient, who gave verbal consent to proceed.  History of Present Illness   The patient, with a history of obesity, prediabetes, and vitamin D deficiency, has maintained her weight since the last visit six weeks ago. She reports difficulty in maintaining a food journal, achieving this only about 30% of the time. The patient is attempting to increase her physical activity, primarily through walking, but does not engage in any formal exercise regimen. She requests a prescription for vitamin D, as her last level was slightly below the goal. Her last A1c was 5.7, and she is not on any medications for prediabetes.  The patient has been providing care for her mother, which has significantly increased her stress levels. She reports struggling with meal planning and preparation, often over-preparing food that goes uneaten and wasted. She expresses frustration with grocery shopping and has had negative experiences with grocery delivery services. She has considered meal delivery services but has not yet tried them.  The patient acknowledges the need for better sleep habits and is seeking ways to reduce her stress levels. She has scheduled an appointment for counseling through her work's [employer] ([employer]). She  expresses a desire to reduce her intake of seed oils and processed foods. She is aiming for a daily caloric intake of 1100-1300 calories and a protein intake of at least 75 grams per day.  The patient is also on a low dose of Armour Thyroid (15 mg) for a thyroid condition. She reports that her thyroid medication dosage has not changed in a long time. She expresses a desire to avoid cholesterol medication, despite having a high LDL cholesterol level. She is also taking a vitamin D supplement, as her last vitamin D level was slightly below the goal.          PHYSICAL EXAM:  Blood pressure (!) 91/57, pulse 80, temperature 97.7 F (36.5 C), height 5\' 1"  (1.549 m), weight 186 lb (84.4 kg), SpO2 97%. Body mass index is 35.14 kg/m.  DIAGNOSTIC DATA REVIEWED:  BMET    Component Value Date/Time   NA 144 12/25/2022 0906   K 4.8 12/25/2022 0906   CL 105 12/25/2022 0906   CO2 24 12/25/2022 0906   GLUCOSE 84 12/25/2022 0906   BUN 14 12/25/2022 0906   CREATININE 0.74 12/25/2022 0906   CALCIUM 10.1 12/25/2022 0906   GFRNONAA 68.67 04/24/2020 0000   GFRAA 83.10 04/24/2020 0000   Lab Results  Component Value Date   HGBA1C 5.7 (H) 12/25/2022   HGBA1C 5.8 (H) 07/14/2019   Lab Results  Component Value Date   INSULIN 7.6 12/25/2022   INSULIN 12.3 07/14/2019   Lab Results  Component Value Date   TSH 2.890 08/13/2021   CBC    Component Value Date/Time  WBC 5.4 12/30/2021 0906   RBC 4.13 12/30/2021 0906   RBC 3.93 10/03/2021 0000   HGB 13.2 12/30/2021 0906   HCT 39.6 12/30/2021 0906   PLT 271 12/30/2021 0906   MCV 96 12/30/2021 0906   MCH 32.0 12/30/2021 0906   MCHC 33.3 12/30/2021 0906   RDW 12.7 12/30/2021 0906   Iron Studies No results found for: "IRON", "TIBC", "FERRITIN", "IRONPCTSAT" Lipid Panel     Component Value Date/Time   CHOL 223 (H) 12/25/2022 0906   TRIG 76 12/25/2022 0906   HDL 53 12/25/2022 0906   LDLCALC 157 (H) 12/25/2022 0906   Hepatic Function Panel      Component Value Date/Time   PROT 6.4 12/25/2022 0906   ALBUMIN 4.3 12/25/2022 0906   AST 17 12/25/2022 0906   ALT 18 12/25/2022 0906   ALKPHOS 69 12/25/2022 0906   BILITOT 0.3 12/25/2022 0906      Component Value Date/Time   TSH 2.890 08/13/2021 0816   Nutritional Lab Results  Component Value Date   VD25OH 46.0 12/25/2022   VD25OH 68.3 05/13/2022   VD25OH 34.0 12/30/2021     Assessment and Plan    Obesity Maintained weight for the last six weeks. Limited success with food journaling and increasing walking. No formal exercise regimen. -Continue efforts to maintain food journal and increase physical activity. -Target daily caloric intake between 1100-1300 calories with at least 75 grams of protein.  Prediabetes Last A1c at 5.7. No current medications for prediabetes. -Continue current management. Monitor A1c levels.  Vitamin D deficiency Last level not at goal. Patient requests prescription for Vitamin D. -Prescribe Vitamin D to achieve target levels.  Caregiver fatigue Patient expresses stress and fatigue related to caregiving for her mother. Sleep disruption noted. -Encourage patient to seek resources for caregiver support, including counseling through [employer] at work and resources from [employer] [facility] [employer]. -Continue to monitor patient's mental health and stress levels.  Hypothyroidism Patient on Armour Thyroid 15mg . No recent thyroid labs. -Coordinate with [facility] for appropriate thyroid lab monitoring.         I have personally spent 45 minutes total time today in preparation, patient care, and documentation for this visit, including the following: review of clinical lab tests; review of medical tests/procedures/services.    She was informed of the importance of frequent follow up visits to maximize her success with intensive lifestyle modifications for her multiple health conditions.    Quillian Quince, MD

## 2023-02-18 ENCOUNTER — Ambulatory Visit
Admission: RE | Admit: 2023-02-18 | Discharge: 2023-02-18 | Disposition: A | Discharge status: Home / Self Care | Payer: 59 | Source: Ambulatory Visit | Attending: Orthopaedic Surgery | Admitting: Orthopaedic Surgery

## 2023-02-18 DIAGNOSIS — M5441 Lumbago with sciatica, right side: Secondary | ICD-10-CM

## 2023-03-13 ENCOUNTER — Other Ambulatory Visit (INDEPENDENT_AMBULATORY_CARE_PROVIDER_SITE_OTHER): Payer: Self-pay | Admitting: Family Medicine

## 2023-03-13 DIAGNOSIS — E559 Vitamin D deficiency, unspecified: Secondary | ICD-10-CM

## 2023-03-19 ENCOUNTER — Ambulatory Visit (INDEPENDENT_AMBULATORY_CARE_PROVIDER_SITE_OTHER): Payer: 59

## 2023-03-19 ENCOUNTER — Ambulatory Visit (INDEPENDENT_AMBULATORY_CARE_PROVIDER_SITE_OTHER): Payer: 59 | Admitting: Podiatry

## 2023-03-19 ENCOUNTER — Encounter: Payer: Self-pay | Admitting: Podiatry

## 2023-03-19 DIAGNOSIS — L84 Corns and callosities: Secondary | ICD-10-CM

## 2023-03-19 DIAGNOSIS — M21612 Bunion of left foot: Secondary | ICD-10-CM

## 2023-03-19 DIAGNOSIS — M21619 Bunion of unspecified foot: Secondary | ICD-10-CM

## 2023-03-19 DIAGNOSIS — M778 Other enthesopathies, not elsewhere classified: Secondary | ICD-10-CM

## 2023-03-19 DIAGNOSIS — M7741 Metatarsalgia, right foot: Secondary | ICD-10-CM

## 2023-03-19 NOTE — Progress Notes (Signed)
Subjective:   Patient ID: Alexandria Ward, female   DOB: 57 y.o.   MRN: [id number]   HPI Chief Complaint  Patient presents with   Callouses    Patient is here for right foot callous and pain   57 year old female presents the office today for concerns of pain, callus on the right side much worse than the left.  She does not report any recent injuries.  She tries to trim the callus on herself but she is having quite a bit of pain to this area and she points on the medial first MPJ but also plantarly go into the second MPJ plantarly as well.  She previously had bunion surgery several years ago on the right foot.   Review of Systems  All other systems reviewed and are negative.  Past Medical History:  Diagnosis Date   Anemia    Asthma    Back pain    Constipation    Depression    Dyspnea    Fatigue    Fluid retention    Hyperlipidemia    Hypothyroidism    IBS (irritable bowel syndrome)    Joint pain    Lower extremity edema    Migraines    Palpitations    Shortness of breath    Tachycardia    Thyroid disease    Vitamin B 12 deficiency    Vitamin D deficiency     Past Surgical History:  Procedure Laterality Date   APPENDECTOMY     endometrial ablasion       Current Outpatient Medications:    Digestive Enzymes (DIGESTIVE ENZYME PO), Take 500 mg by mouth., Disp: , Rfl:    fluticasone (FLONASE) 50 MCG/ACT nasal spray, Place into the nose., Disp: , Rfl:    Fremanezumab-vfrm (AJOVY) 225 MG/1.5ML SOAJ, Inject 225 mg into the skin every 30 (thirty) days., Disp: 4.5 mL, Rfl: 3   magnesium gluconate (MAGONATE) 500 MG tablet, Take 500 mg by mouth daily., Disp: , Rfl:    Multiple Vitamins-Minerals (HAIR/SKIN/NAILS/BIOTIN PO), Take 1 tablet by mouth daily., Disp: , Rfl:    omeprazole (PRILOSEC) 40 MG capsule, Take 1 capsule (40 mg total) by mouth daily., Disp: 30 capsule, Rfl: 0   ondansetron (ZOFRAN ODT) 4 MG disintegrating tablet, Take 1 tablet (4 mg total) by mouth every 8  (eight) hours as needed., Disp: 20 tablet, Rfl: 1   SUMAtriptan (IMITREX) 100 MG tablet, Take 1 tablet (100 mg total) by mouth every 2 (two) hours as needed for migraine. May repeat in 2 hours if headache persists or recurs. Do not exceed 2 does in 24 hours or 10 doses in 1 month., Disp: 27 tablet, Rfl: 3   thyroid (ARMOUR) 15 MG tablet, Take 15 mg by mouth daily., Disp: , Rfl:    Vitamin D, Ergocalciferol, (DRISDOL) 1.25 MG (50000 UNIT) CAPS capsule, Take 1 capsule (50,000 Units total) by mouth every 7 (seven) days., Disp: 4 capsule, Rfl: 0   zolmitriptan (ZOMIG) 5 MG tablet, Take 1 tablet (5 mg total) by mouth as needed for migraine., Disp: 10 tablet, Rfl: 1  Allergies  Allergen Reactions   Betadine [Povidone Iodine]    Azithromycin Rash   Ciprofloxacin Rash   Penicillins Rash    Cant remember the reaction or the severity of it   Tape Rash           Objective:  Physical Exam  General: AAO x3, NAD  Dermatological: Skin is warm, dry and supple bilateral.  Hyperkeratotic lesion  noted medial hallux, appears to be today bilaterally right side worse than left.  No open lesions.  Vascular: Dorsalis Pedis artery and Posterior Tibial artery pedal pulses are 2/4 bilateral with immedate capillary fill time. There is no pain with calf compression, swelling, warmth, erythema.   Neruologic: Grossly intact via light touch bilateral.   Musculoskeletal: She has tenderness palpation of sesamoids, second MPJ plantarly proximal on the medial aspect of her subjective the hallux.  There is no crepitation with MPJ range of motion.  There is no area pinpoint tenderness identified otherwise.  MMT pressure is 5.  Gait: Unassisted, Nonantalgic.       Assessment:   57 year old female with capsulitis, callus formation right foot worse than left     Plan:  -Treatment options discussed including all alternatives, risks, and complications -Etiology of symptoms were discussed -X-rays were obtained and  reviewed with the patient.  3 views right foot were obtained.  No evidence of acute fracture.  Evidence of prior bunion surgery.  Hallux abductus present. -As a courtesy debrided the callus without any complications or bleeding.  Discussed moisturizer, offloading.  We added a dancers pad to her inserts today.  She was seen by Bethann Berkshire for this as well as measured for custom orthotics to help offload during her pain. -Discussed Voltaren gel, anti-inflammatories as needed.  Consider steroid injection if needed.  Vivi Barrack DPM

## 2023-03-26 ENCOUNTER — Ambulatory Visit (INDEPENDENT_AMBULATORY_CARE_PROVIDER_SITE_OTHER): Payer: 59 | Admitting: Family Medicine

## 2023-04-02 ENCOUNTER — Ambulatory Visit (INDEPENDENT_AMBULATORY_CARE_PROVIDER_SITE_OTHER): Payer: 59 | Admitting: Internal Medicine

## 2023-04-02 ENCOUNTER — Encounter: Payer: Self-pay | Admitting: Internal Medicine

## 2023-04-02 VITALS — BP 110/70 | HR 91 | Temp 97.6°F | Wt 194.4 lb

## 2023-04-02 DIAGNOSIS — R61 Generalized hyperhidrosis: Secondary | ICD-10-CM

## 2023-04-02 MED ORDER — DRYSOL 20 % EX SOLN
Freq: Every day | CUTANEOUS | 0 refills | Status: AC
Start: 2023-04-02 — End: ?

## 2023-04-02 NOTE — Progress Notes (Signed)
Established Patient Office Visit     CC/Reason for Visit: Excessive perspiration  HPI: Alexandria Ward is a 57 y.o. female who is coming in today for the above mentioned reasons.  She was using clinical strength deodorant and it has been discontinued.  Ever since has been noticing excessive perspiration patient and increased body odor.  Past Medical/Surgical History: Past Medical History:  Diagnosis Date   Anemia    Asthma    Back pain    Constipation    Depression    Dyspnea    Fatigue    Fluid retention    Hyperlipidemia    Hypothyroidism    IBS (irritable bowel syndrome)    Joint pain    Lower extremity edema    Migraines    Palpitations    Shortness of breath    Tachycardia    Thyroid disease    Vitamin B 12 deficiency    Vitamin D deficiency     Past Surgical History:  Procedure Laterality Date   APPENDECTOMY     endometrial ablasion      Social History:  reports that she has never smoked. She has never used smokeless tobacco. She reports current alcohol use. She reports that she does not use drugs.  Allergies: Allergies  Allergen Reactions   Betadine [Povidone Iodine]    Azithromycin Rash   Ciprofloxacin Rash   Penicillins Rash    Cant remember the reaction or the severity of it   Tape Rash    Family History:  Family History  Problem Relation Age of Onset   Restless legs syndrome Mother    Arthritis Mother    Neuropathy Mother    Hyperlipidemia Mother    Depression Mother    Heart attack Father      Current Outpatient Medications:    aluminum chloride (DRYSOL) 20 % external solution, Apply topically at bedtime., Disp: 35 mL, Rfl: 0   b complex vitamins capsule, Take 1 capsule by mouth daily., Disp: , Rfl:    Digestive Enzymes (DIGESTIVE ENZYME PO), Take 500 mg by mouth., Disp: , Rfl:    fluticasone (FLONASE) 50 MCG/ACT nasal spray, Place into the nose., Disp: , Rfl:    Fremanezumab-vfrm (AJOVY) 225 MG/1.5ML SOAJ, Inject 225 mg  into the skin every 30 (thirty) days., Disp: 4.5 mL, Rfl: 3   magnesium gluconate (MAGONATE) 500 MG tablet, Take 500 mg by mouth daily., Disp: , Rfl:    Multiple Vitamins-Minerals (HAIR/SKIN/NAILS/BIOTIN PO), Take 1 tablet by mouth daily., Disp: , Rfl:    omeprazole (PRILOSEC) 40 MG capsule, Take 1 capsule (40 mg total) by mouth daily., Disp: 30 capsule, Rfl: 0   ondansetron (ZOFRAN ODT) 4 MG disintegrating tablet, Take 1 tablet (4 mg total) by mouth every 8 (eight) hours as needed., Disp: 20 tablet, Rfl: 1   SUMAtriptan (IMITREX) 100 MG tablet, Take 1 tablet (100 mg total) by mouth every 2 (two) hours as needed for migraine. May repeat in 2 hours if headache persists or recurs. Do not exceed 2 does in 24 hours or 10 doses in 1 month., Disp: 27 tablet, Rfl: 3   thyroid (ARMOUR) 15 MG tablet, Take 15 mg by mouth daily., Disp: , Rfl:    Vitamin D, Ergocalciferol, (DRISDOL) 1.25 MG (50000 UNIT) CAPS capsule, Take 1 capsule (50,000 Units total) by mouth every 7 (seven) days., Disp: 4 capsule, Rfl: 0   zolmitriptan (ZOMIG) 5 MG tablet, Take 1 tablet (5 mg total) by mouth as  needed for migraine., Disp: 10 tablet, Rfl: 1  Review of Systems:  Negative unless indicated in HPI.   Physical Exam: Vitals:   04/02/23 1036  BP: 110/70  Pulse: 91  Temp: 97.6 F (36.4 C)  TempSrc: Oral  SpO2: 98%  Weight: 194 lb 6.4 oz (88.2 kg)    Body mass index is 36.73 kg/m.   Physical Exam Vitals reviewed.  Constitutional:      Appearance: Normal appearance.  HENT:     Head: Normocephalic and atraumatic.  Eyes:     Conjunctiva/sclera: Conjunctivae normal.     Pupils: Pupils are equal, round, and reactive to light.  Skin:    General: Skin is warm and dry.  Neurological:     General: No focal deficit present.     Mental Status: She is alert and oriented to person, place, and time.  Psychiatric:        Mood and Affect: Mood normal.        Behavior: Behavior normal.        Thought Content: Thought  content normal.        Judgment: Judgment normal.      Impression and Plan:  Perspiration excessive -     Drysol; Apply topically at bedtime.  Dispense: 35 mL; Refill: 0    Time spent:20 minutes reviewing chart, interviewing and examining patient and formulating plan of care.     Chaya Jan, MD  Primary [facility] at [facility]

## 2023-04-13 NOTE — Progress Notes (Signed)
Orthotic order placed items to be fit when in  [employer], Cfo, Cfm

## 2023-04-15 ENCOUNTER — Ambulatory Visit (INDEPENDENT_AMBULATORY_CARE_PROVIDER_SITE_OTHER): Payer: 59 | Admitting: Family Medicine

## 2023-04-15 ENCOUNTER — Encounter (INDEPENDENT_AMBULATORY_CARE_PROVIDER_SITE_OTHER): Payer: Self-pay | Admitting: Family Medicine

## 2023-04-15 VITALS — BP 85/58 | HR 98 | Temp 97.6°F | Ht 61.0 in | Wt 190.0 lb

## 2023-04-15 DIAGNOSIS — E669 Obesity, unspecified: Secondary | ICD-10-CM

## 2023-04-15 DIAGNOSIS — R7303 Prediabetes: Secondary | ICD-10-CM

## 2023-04-15 DIAGNOSIS — Z6835 Body mass index (BMI) 35.0-35.9, adult: Secondary | ICD-10-CM | POA: Diagnosis not present

## 2023-04-15 DIAGNOSIS — Z6836 Body mass index (BMI) 36.0-36.9, adult: Secondary | ICD-10-CM

## 2023-04-15 DIAGNOSIS — E559 Vitamin D deficiency, unspecified: Secondary | ICD-10-CM | POA: Diagnosis not present

## 2023-04-15 MED ORDER — METFORMIN HCL 500 MG PO TABS
500.0000 mg | ORAL_TABLET | Freq: Every day | ORAL | 0 refills | Status: DC
Start: 2023-04-15 — End: 2023-12-01

## 2023-04-15 MED ORDER — VITAMIN D (ERGOCALCIFEROL) 1.25 MG (50000 UNIT) PO CAPS
50000.0000 [IU] | ORAL_CAPSULE | ORAL | 0 refills | Status: DC
Start: 2023-04-15 — End: 2023-06-16

## 2023-04-15 NOTE — Progress Notes (Signed)
.smr  Office: [phone]  /  Fax: [phone]  WEIGHT SUMMARY AND BIOMETRICS  Anthropometric Measurements Height: 5\' 1"  (1.549 m) Weight: 190 lb (86.2 kg) BMI (Calculated): 35.92 Weight at Last Visit: 186 lb Weight Lost Since Last Visit: 0 Weight Gained Since Last Visit: 4 lb Total Weight Loss (lbs): 16 lb (7.258 kg)   Body Composition  Body Fat %: 46.2 % Fat Mass (lbs): 88.2 lbs Muscle Mass (lbs): 97.4 lbs Total Body Water (lbs): 74.4 lbs Visceral Fat Rating : 13   Other Clinical Data Fasting: no Labs: no Today's Visit #: 44 Starting Date: 08/13/20    Chief Complaint: OBESITY    History of Present Illness   The patient, with a history of obesity and vitamin D deficiency, presents for a routine follow-up. She reports a weight gain of four pounds over the last two months. She admits to inconsistent journaling of her food intake, doing so approximately 30% of the time, and is currently not engaging in any exercise.  The patient is also managing a vitamin D deficiency with prescription supplements. She expresses a lack of energy and struggles with portion control due to her love for food, including healthy options. She has been considering a high-protein diet, inspired by keto diet recipes, but is unsure if this would be a suitable option for her.  In addition to her weight concerns, the patient has been experiencing knee pain, which has been diagnosed as arthritis. She has been attending physical therapy, which she reports is helping. However, this condition has been limiting her time and energy for cooking.  The patient is also dealing with personal stressors, including caring for her mother who is scheduled for shoulder replacement surgery. This has led to additional responsibilities, including taking over holiday cooking duties. She has been seeking counseling through her work's employee assistance program to help manage these stressors.  The patient has been  considering seeking additional help for her mother, but expresses concerns about handing over medication responsibilities to another person. She has been exploring local resources but finds the process overwhelming due to the volume of information and the need to filter out irrelevant options.  The patient's goal is to continue working on her diet and exercise, aiming for a daily intake of 1100 to 1300 calories and at least 75 grams of protein. She expresses a desire to take care of her health amidst her current challenges.          PHYSICAL EXAM:  Blood pressure (!) 85/58, pulse 98, temperature 97.6 F (36.4 C), height 5\' 1"  (1.549 m), weight 190 lb (86.2 kg), SpO2 97%. Body mass index is 35.9 kg/m.  DIAGNOSTIC DATA REVIEWED:  BMET    Component Value Date/Time   NA 144 12/25/2022 0906   K 4.8 12/25/2022 0906   CL 105 12/25/2022 0906   CO2 24 12/25/2022 0906   GLUCOSE 84 12/25/2022 0906   BUN 14 12/25/2022 0906   CREATININE 0.74 12/25/2022 0906   CALCIUM 10.1 12/25/2022 0906   GFRNONAA 68.67 04/24/2020 0000   GFRAA 83.10 04/24/2020 0000   Lab Results  Component Value Date   HGBA1C 5.7 (H) 12/25/2022   HGBA1C 5.8 (H) 07/14/2019   Lab Results  Component Value Date   INSULIN 7.6 12/25/2022   INSULIN 12.3 07/14/2019   Lab Results  Component Value Date   TSH 2.890 08/13/2021   CBC    Component Value Date/Time   WBC 5.4 12/30/2021 0906   RBC 4.13 12/30/2021 0906  RBC 3.93 10/03/2021 0000   HGB 13.2 12/30/2021 0906   HCT 39.6 12/30/2021 0906   PLT 271 12/30/2021 0906   MCV 96 12/30/2021 0906   MCH 32.0 12/30/2021 0906   MCHC 33.3 12/30/2021 0906   RDW 12.7 12/30/2021 0906   Iron Studies No results found for: "IRON", "TIBC", "FERRITIN", "IRONPCTSAT" Lipid Panel     Component Value Date/Time   CHOL 223 (H) 12/25/2022 0906   TRIG 76 12/25/2022 0906   HDL 53 12/25/2022 0906   LDLCALC 157 (H) 12/25/2022 0906   Hepatic Function Panel     Component Value  Date/Time   PROT 6.4 12/25/2022 0906   ALBUMIN 4.3 12/25/2022 0906   AST 17 12/25/2022 0906   ALT 18 12/25/2022 0906   ALKPHOS 69 12/25/2022 0906   BILITOT 0.3 12/25/2022 0906      Component Value Date/Time   TSH 2.890 08/13/2021 0816   Nutritional Lab Results  Component Value Date   VD25OH 46.0 12/25/2022   VD25OH 68.3 05/13/2022   VD25OH 34.0 12/30/2021     Assessment and Plan    Obesity and prediabetes Weight gain of 4 pounds over the last two months. Adherence to diet and exercise regimen is inconsistent. Discussed the benefits of consistent journaling and the importance of social interaction and hobbies for overall well-being. Also discussed the potential benefits of Metformin for weight loss, but patient wishes to research further before starting. -Continue to encourage diet and exercise. -Consider Metformin for weight loss after patient completes her research. -Plan for follow-up visit after the holidays, with fasting for updated blood work.  Vitamin D Deficiency Patient is on prescription Vitamin D. -Continue prescription Vitamin D. -Check Vitamin D levels at next visit.        She was informed of the importance of frequent follow up visits to maximize her success with intensive lifestyle modifications for her multiple health conditions.    Quillian Quince, MD

## 2023-05-05 ENCOUNTER — Other Ambulatory Visit: Payer: Self-pay | Admitting: *Deleted

## 2023-05-05 MED ORDER — ZOLMITRIPTAN 5 MG PO TABS: 5.0000 mg | ORAL_TABLET | ORAL | 0 refills | Status: DC | PRN

## 2023-05-05 NOTE — Telephone Encounter (Signed)
Last seen on 06/03/22 Follow up scheduled on 05/26/23

## 2023-05-07 NOTE — Progress Notes (Signed)
Called patient to let know approx delivery date of when CFO's will be in  Appt for fitting set 12/5 3:40 Alexandria Ward Cped, CFo, CFm

## 2023-05-21 ENCOUNTER — Ambulatory Visit: Payer: 59

## 2023-05-21 NOTE — Progress Notes (Signed)
Patient presents today to pick up custom molded foot orthotics, diagnosed with Capsulitis with  by Dr. Ardelle Anton. Patient was very apprehensive about new orthotics   Orthotics were dispensed and fit was satisfactory however she asked what to do if she doesn't like them? I explained we can adjustment and or she can FU with Dr. As well  Reviewed instructions for break-in and wear. Written instructions given to patient.  Patient will follow up as needed.  Addison Bailey Cped, CFo, CFm

## 2023-05-26 ENCOUNTER — Encounter: Payer: Self-pay | Admitting: Neurology

## 2023-05-26 ENCOUNTER — Ambulatory Visit (INDEPENDENT_AMBULATORY_CARE_PROVIDER_SITE_OTHER): Payer: 59 | Admitting: Neurology

## 2023-05-26 VITALS — BP 97/60 | HR 88 | Ht 61.0 in | Wt 194.8 lb

## 2023-05-26 DIAGNOSIS — G43001 Migraine without aura, not intractable, with status migrainosus: Secondary | ICD-10-CM | POA: Diagnosis not present

## 2023-05-26 DIAGNOSIS — G478 Other sleep disorders: Secondary | ICD-10-CM | POA: Diagnosis not present

## 2023-05-26 DIAGNOSIS — G4733 Obstructive sleep apnea (adult) (pediatric): Secondary | ICD-10-CM

## 2023-05-26 MED ORDER — AJOVY 225 MG/1.5ML ~~LOC~~ SOAJ: 225.0000 mg | SUBCUTANEOUS | 3 refills | Status: AC

## 2023-05-26 NOTE — Progress Notes (Signed)
Provider:  Melvyn Novas, MD  Primary Care Physician:  Alexandria Ward, Alexandria Patricia, MD [address] [place] [address]     Referring Provider: Fatima Sanger, Fnp [address] [place],  [place] [address]          Chief Complaint according to patient   Patient presents with:     New Patient (Initial Visit)           HISTORY OF PRESENT ILLNESS:  Alexandria Ward is a 57 y.o. female patient who is here for revisit 05/26/2023 for  CPAP follow up.  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 My mother had memory loss and its difficult at time.  I am having much less headaches on ajovy , will need refills. I have the same sleep problems I have had - and still need a new prescription for a sleep balance device to avoid supine sleep.   Carpal tunnel surgery on both wrists ( March and April)  and still go to dr Alexandria Ward, weight and wellness.               06/03/22 ALL: Alexandria Ward returns for follow up OSA and migraines. Last HST continued to show mild OSA. AutoPAP advised but she had not been able to tolerate CPAP in past. Dr Alexandria Ward gave her a rx for the [facility] Balance device to help keep her off her back. She has not submitted rx.    She continues Ajovy every month and sumatriptan injections, ondansetron, tizanidine and ondansetron for abortive therapy. She reports migraines are usually very well managed. Until this week, last migraine was in 02/2022. She has had a lingering headache for the past couple of days. She took sumatriptan injection and zomig but both were expired.    05/29/2020 ALL: Alexandria Ward is a 57 y.o. female here today for follow up for OSA. HST in 07/2019 showed AHI 9/hr with REM AHI 32/hr. She was started on CPAP therapy but was not able to tolerate wearing a mask. She was referred to healthy weight and wellness and  has lost 36 pounds since sleep study. She continues to focus on healthy lifestyle habits. She has taken Ambien a few times and it seems to help. She feels that she is nervous to take this regularly as it makes her sleep so deeply.    HISTORY: (copied from previous note)   Alexandria Ward is a 57 year- old Caucasian female patient seen in a RV on 11/28/2019.   I had the pleasure of seeing her in consultation 1/ 12 /2021 and underwent 2 sleep studies. When I first met Alexandria Ward in January she had a reported history was in 6 months prior to our appointment of vertigo and dizziness she had been seen by vestibular rehab and PT.  She developed ankle edema and swelling and she had reported quite a significant steady weight gain.  We discussed referral to healthy weight and wellness service of [facility] [place].  She enrolled and in the meantime has lost 29 pounds! Her insurance company did not permit [place] to do an attended sleep study also she had a history of parasomnia and vivid dreams.  She underwent a home sleep test on 10 August 2019. Mild obstructive sleep apnea was documented with an overall  Apnea- Hypopnea  Index/ hour of sleep (AHI) of 9.2/h and strong  REM sleep dependency. The REM sleep AHI was 32.3/h, three times  the overall AHI. There was also supine sleep position dependency.  Herat rate variability was within normal range and no clinically  significant hypoxemia was noted. Moderately loud snoring was  indicated by RDI.      Unfortunately hospital refused data to reviewed only 6 nights but in the last 3 months.  The user time for those exceptional nights were 3 hours and 41 minutes on average the AutoSet is set between a minimum pressure of 5 and a maximum pressure of 15 was 3 cm EPR and the residual AHI is 1.7 given that she had a mild apnea to begin with this is still a significant reduction in apnea counts.  No central apneas are noted and the air leak is moderate the 95th percentile pressure is  only 7.9 cmH2O.  Also she wore a retainer and bite guard. She woke with migraines.   It may help if we are reducing the upper pressure window for the patient. She feels that it was too much pressure.      I have the pleasure of seeing Alexandria Ward today, a right -handed Caucasian female with a possible sleep disorder.  She  has a past medical history of Anemia, Asthma, Back pain, Constipation, Depression, Dyspnea, Fatigue, Fluid retention, Hyperlipidemia, Hypothyroidism, IBS (irritable bowel syndrome), Joint pain, Lower extremity edema, Migraines, Palpitations, Shortness of breath, Tachycardia, Thyroid disease, Vitamin B 12 deficiency, and Vitamin D deficiency.. She is taking synthroid. But developed tachycardia, ankle edema.and hypotension with Vertigo on BP medication.  She gained 50 pounds , is menopausal. Was diagnosed with hypothyroidism 12 month ago.  She has seen Alexandria Ward , who did a (negative) cortisol test.         Review of Systems: Out of a complete 14 system review, the patient complains of only the following symptoms, and all other reviewed systems are negative.:  Fatigue, sleepiness, migraine.     How likely are you to doze in the following situations: 0 = not likely, 1 = slight chance, 2 = moderate chance, 3 = high chance   Sitting and Reading? Watching Television? Sitting inactive in a public place (theater or meeting)? As a passenger in a car for an hour without a break? Lying down in the afternoon when circumstances permit? Sitting and talking to someone? Sitting quietly after lunch without alcohol? In a car, while stopped for a few minutes in traffic?    Social History   Socioeconomic History   Marital status: Single    Spouse name: Not on file   Number of children: 0   Years of education: Not on file   Highest education level: Not on file  Occupational History   Occupation: Tax adviser  Tobacco Use   Smoking status: Never   Smokeless  tobacco: Never  Vaping Use   Vaping status: Never Used  Substance and Sexual Activity   Alcohol use: Not Currently    Comment: rarely   Drug use: Never   Sexual activity: Not on file  Other Topics Concern   Not on file  Social History Narrative   Lives alone    Pt works    Social Determinants of Corporate investment banker Strain: Not on file  Food Insecurity: Not on file  Transportation Needs: Not on file  Physical Activity: Not on file  Stress: Not on file  Social  Connections: Not on file    Family History  Problem Relation Age of Onset   Restless legs syndrome Mother    Arthritis Mother    Neuropathy Mother    Hyperlipidemia Mother    Depression Mother    Migraines Mother    Heart attack Father     Past Medical History:  Diagnosis Date   Anemia    Asthma    Back pain    Constipation    Depression    Dyspnea    Fatigue    Fluid retention    Hyperlipidemia    Hypothyroidism    IBS (irritable bowel syndrome)    Joint pain    Lower extremity edema    Migraines    Palpitations    Shortness of breath    Tachycardia    Thyroid disease    Vitamin B 12 deficiency    Vitamin D deficiency     Past Surgical History:  Procedure Laterality Date   APPENDECTOMY     CARPAL TUNNEL RELEASE Bilateral    2024   endometrial ablasion       Current Outpatient Medications on File Prior to Visit  Medication Sig Dispense Refill   aluminum chloride (DRYSOL) 20 % external solution Apply topically at bedtime. 35 mL 0   b complex vitamins capsule Take 1 capsule by mouth daily.     Digestive Enzymes (DIGESTIVE ENZYME PO) Take 500 mg by mouth.     fluticasone (FLONASE) 50 MCG/ACT nasal spray Place into the nose.     Fremanezumab-vfrm (AJOVY) 225 MG/1.5ML SOAJ Inject 225 mg into the skin every 30 (thirty) days. 4.5 mL 3   magnesium gluconate (MAGONATE) 500 MG tablet Take 500 mg by mouth daily.     metFORMIN (GLUCOPHAGE) 500 MG tablet Take 1 tablet (500 mg total) by mouth  daily with breakfast. 90 tablet 0   Multiple Vitamins-Minerals (HAIR/SKIN/NAILS/BIOTIN PO) Take 1 tablet by mouth daily.     omeprazole (PRILOSEC) 40 MG capsule Take 1 capsule (40 mg total) by mouth daily. 30 capsule 0   ondansetron (ZOFRAN ODT) 4 MG disintegrating tablet Take 1 tablet (4 mg total) by mouth every 8 (eight) hours as needed. 20 tablet 1   SUMAtriptan (IMITREX) 100 MG tablet Take 1 tablet (100 mg total) by mouth every 2 (two) hours as needed for migraine. May repeat in 2 hours if headache persists or recurs. Do not exceed 2 does in 24 hours or 10 doses in 1 month. 27 tablet 3   thyroid (ARMOUR) 15 MG tablet Take 15 mg by mouth daily.     Vitamin D, Ergocalciferol, (DRISDOL) 1.25 MG (50000 UNIT) CAPS capsule Take 1 capsule (50,000 Units total) by mouth every 7 (seven) days. 13 capsule 0   zolmitriptan (ZOMIG) 5 MG tablet Take 1 tablet (5 mg total) by mouth as needed for migraine. 10 tablet 0   No current facility-administered medications on file prior to visit.    Allergies  Allergen Reactions   Betadine [Povidone Iodine]    Azithromycin Rash   Ciprofloxacin Rash   Penicillins Rash    Cant remember the reaction or the severity of it   Tape Rash     DIAGNOSTIC DATA (LABS, IMAGING, TESTING) - I reviewed patient records, labs, notes, testing and imaging myself where available.  Lab Results  Component Value Date   WBC 5.4 12/30/2021   HGB 13.2 12/30/2021   HCT 39.6 12/30/2021   MCV 96 12/30/2021   PLT 271  12/30/2021      Component Value Date/Time   NA 144 12/25/2022 0906   K 4.8 12/25/2022 0906   CL 105 12/25/2022 0906   CO2 24 12/25/2022 0906   GLUCOSE 84 12/25/2022 0906   BUN 14 12/25/2022 0906   CREATININE 0.74 12/25/2022 0906   CALCIUM 10.1 12/25/2022 0906   PROT 6.4 12/25/2022 0906   ALBUMIN 4.3 12/25/2022 0906   AST 17 12/25/2022 0906   ALT 18 12/25/2022 0906   ALKPHOS 69 12/25/2022 0906   BILITOT 0.3 12/25/2022 0906   GFRNONAA 68.67 04/24/2020 0000    GFRAA 83.10 04/24/2020 0000   Lab Results  Component Value Date   CHOL 223 (H) 12/25/2022   HDL 53 12/25/2022   LDLCALC 157 (H) 12/25/2022   TRIG 76 12/25/2022   Lab Results  Component Value Date   HGBA1C 5.7 (H) 12/25/2022   Lab Results  Component Value Date   VITAMINB12 740 05/13/2022   Lab Results  Component Value Date   TSH 2.890 08/13/2021    PHYSICAL EXAM:  Today's Vitals   05/26/23 1542  BP: 97/60  Pulse: 88  Weight: 194 lb 12.8 oz (88.4 kg)  Height: 5\' 1"  (1.549 m)   Body mass index is 36.81 kg/m.   Wt Readings from Last 3 Encounters:  05/26/23 194 lb 12.8 oz (88.4 kg)  04/15/23 190 lb (86.2 kg)  04/02/23 194 lb 6.4 oz (88.2 kg)     Ht Readings from Last 3 Encounters:  05/26/23 5\' 1"  (1.549 m)  04/15/23 5\' 1"  (1.549 m)  02/12/23 5\' 1"  (1.549 m)      General: The patient is awake, alert and appears not in acute distress. The patient is well groomed. Head: Normocephalic, atraumatic. Neck is supple. MCardiovascular:  Regular rate and cardiac rhythm by pulse,  without distended neck veins. Respiratory: Lungs are clear to auscultation.  Skin:  Without evidence of ankle edema, or rash. Trunk: The patient's posture is erect.   NEUROLOGIC EXAM: The patient is awake and alert, oriented to place and time.   Memory subjective described as intact.  Attention span & concentration ability appears normal.  Speech is fluent,  without  dysarthria, dysphonia or aphasia.  Mood and affect are appropriate.   Cranial nerves: no loss of smell or taste reported  Pupils are equal and briskly reactive to light. Extraocular movements in vertical and horizontal planes were intact and without nystagmus. No Diplopia.  Facial motor strength is symmetric and tongue and uvula move midline.  Neck ROM : rotation, tilt and flexion extension were normal for age and shoulder shrug was symmetrical.    Motor exam:  Symmetric bulk, tone and ROM.   Normal tone  .   Sensory:  Normal.  Proprioception tested in the upper extremities was normal.   Gait and station:  deferred.  Deep tendon reflexes: in the upper and lower extremities are symmetrically attenuated and intact.  Babinski response was deferred .    Dx Positional sleep apnea, start on : The [employer] NightBalance is a wearable device that helps treat positional obstructive sleep apnea (OSA) by gently vibrating to encourage you to sleep on your side:  How it works: The NightBalance monitors your sleep position and adjusts the intensity of its vibrations to encourage you to move onto your side. It's designed to be quiet and not disturb your sleep.  Features: The NightBalance collects data on how long you sleep, how well you respond to therapy, and how much time  you spend on your back. You can view this data on the device's sensor or through the NightBalance app.  Design: The NightBalance is palm-sized and worn across your chest with an adjustable, machine-washable strap.   ADDENDUM : The Night Balance device is no longer produced by [employer], there is a comparable device that is available through the internet and I will provide a DME prescription for Health savings account deductibility. 05-27-2023.   ZZoma  positional sleep device  or NIGHT SHIFT LE Sleep Positioner , available through the CPAP shop.   I would like to thank Alexandria Ward, Alexandria Patricia, MD and Alexandria Sanger, Fnp [address] [place],  [place] [address] for allowing me to meet with and to take care of this pleasant patient.   CC: I will share my notes with PCP .  After spending a total time of  25  minutes face to face and additional time for physical and neurologic examination, review of laboratory studies,  personal review of imaging studies, reports and results of other testing and review of referral information / records as far as provided in visit,   Electronically signed by:  Alexandria Novas, MD 05/26/2023 3:56 PM  [facility] Sleep Board certified by The [employer] of Sleep Medicine and Diplomate of the [employer] of Sleep Medicine. Board certified In Neurology through the ABPN, Fellow of the [employer] of Neu[employer].

## 2023-05-26 NOTE — Patient Instructions (Signed)
The [employer] NightBalance is a wearable device that helps treat positional obstructive sleep apnea (OSA) by gently vibrating to encourage you to sleep on your side:  How it works: The NightBalance monitors your sleep position and adjusts the intensity of its vibrations to encourage you to move onto your side. It's designed to be quiet and not disturb your sleep.  Features: The NightBalance collects data on how long you sleep, how well you respond to therapy, and how much time you spend on your back. You can view this data on the device's sensor or through the NightBalance app.  Design: The NightBalance is palm-sized and worn across your chest with an adjustable, machine-washable strap.  Effectiveness: The NightBalance is clinically non-inferior to CPAP therapy.

## 2023-05-27 ENCOUNTER — Telehealth: Payer: Self-pay | Admitting: Neurology

## 2023-05-27 DIAGNOSIS — G4733 Obstructive sleep apnea (adult) (pediatric): Secondary | ICD-10-CM

## 2023-05-27 NOTE — Telephone Encounter (Signed)
The Night Balance device is no longer produced by [employer], there is a comparable device that is available through the internet and I will provide a DME prescription for Health savings account deductibility. 05-27-2023.   ZZoma  positional sleep device  or NIGHT SHIFT LE Sleep Positioner , available through the CPAP shop.

## 2023-06-02 NOTE — Telephone Encounter (Signed)
I will place the order in the mail box for the patient to have the script

## 2023-06-02 NOTE — Telephone Encounter (Signed)
Pt returning call. Advised of information on note. She would like to move forward with the script for the device.

## 2023-06-02 NOTE — Telephone Encounter (Signed)
Called the patient and there was no answer. LVM advising the patient that Dr Vickey Huger had a prescription for a device that can help with sleeping on the side she found. She states that its something you purchase online with a script but should be able to use health care savings card, so it may help. LVM asking for the patient to call back if she would like for [place] to provide her with the script.

## 2023-06-08 ENCOUNTER — Ambulatory Visit (INDEPENDENT_AMBULATORY_CARE_PROVIDER_SITE_OTHER): Payer: 59 | Admitting: Family Medicine

## 2023-06-16 ENCOUNTER — Encounter (INDEPENDENT_AMBULATORY_CARE_PROVIDER_SITE_OTHER): Payer: Self-pay | Admitting: Family Medicine

## 2023-06-16 ENCOUNTER — Ambulatory Visit (INDEPENDENT_AMBULATORY_CARE_PROVIDER_SITE_OTHER): Payer: 59 | Admitting: Family Medicine

## 2023-06-16 VITALS — BP 86/55 | HR 70 | Temp 97.6°F | Ht 61.0 in | Wt 190.0 lb

## 2023-06-16 DIAGNOSIS — E7849 Other hyperlipidemia: Secondary | ICD-10-CM

## 2023-06-16 DIAGNOSIS — E785 Hyperlipidemia, unspecified: Secondary | ICD-10-CM

## 2023-06-16 DIAGNOSIS — E559 Vitamin D deficiency, unspecified: Secondary | ICD-10-CM | POA: Diagnosis not present

## 2023-06-16 DIAGNOSIS — R7303 Prediabetes: Secondary | ICD-10-CM | POA: Diagnosis not present

## 2023-06-16 DIAGNOSIS — E669 Obesity, unspecified: Secondary | ICD-10-CM

## 2023-06-16 DIAGNOSIS — R21 Rash and other nonspecific skin eruption: Secondary | ICD-10-CM | POA: Diagnosis not present

## 2023-06-16 DIAGNOSIS — Z6835 Body mass index (BMI) 35.0-35.9, adult: Secondary | ICD-10-CM

## 2023-06-16 MED ORDER — VITAMIN D (ERGOCALCIFEROL) 1.25 MG (50000 UNIT) PO CAPS
50000.0000 [IU] | ORAL_CAPSULE | ORAL | 0 refills | Status: DC
Start: 2023-06-16 — End: 2023-10-26

## 2023-06-16 NOTE — Progress Notes (Signed)
 .smr  Office: [phone]  /  Fax: [phone]  WEIGHT SUMMARY AND BIOMETRICS  Anthropometric Measurements Height: 5' 1 (1.549 m) Weight: 190 lb (86.2 kg) BMI (Calculated): 35.92 Weight at Last Visit: 190 lb Weight Lost Since Last Visit: 0 Weight Gained Since Last Visit: 0 Starting Weight: 206 lb Total Weight Loss (lbs): 16 lb (7.258 kg)   Body Composition  Body Fat %: 46.2 % Fat Mass (lbs): 87.8 lbs Muscle Mass (lbs): 97 lbs Total Body Water (lbs): 73.6 lbs Visceral Fat Rating : 13   Other Clinical Data Fasting: No Labs: Yes Today's Visit #: 45 Starting Date: 07/14/19    Chief Complaint: OBESITY  .  History of Present Illness   The patient, with a history of obesity and vitamin D  deficiency, has maintained her weight over the past two months. She reports not having been able to exercise or maintain a food journal during the holiday season. She is on prescription vitamin D  and requested a refill.  The patient has been struggling with the challenges of caregiving for her elderly mother, which has impacted her ability to focus on her own health and well-being. She expresses a need to prioritize her own dietary needs and plans to start cooking more for herself. She also mentions a potential change in her work situation, which adds to her stress.  The patient has been experiencing skin issues, specifically on her right thumb. The skin has been cracking and bleeding, which she initially thought was eczema. She has been applying various topical treatments, including jojoba oil and Vaseline, which have led to some improvement.  The patient also has a history of hyperlipidemia and prediabetes. She expresses a desire to manage these conditions through diet and weight loss rather than medication, although she acknowledges the potential need for metformin  in the future. She is open to exploring different meal plans to help manage her conditions.  In summary, the patient is  dealing with multiple health issues, including obesity, vitamin D  deficiency, hyperlipidemia, and prediabetes, while also managing the stress of caregiving for her elderly mother. She is seeking ways to improve her health through diet and lifestyle changes.          PHYSICAL EXAM:  Blood pressure (!) 86/55, pulse 70, temperature 97.6 F (36.4 C), height 5' 1 (1.549 m), weight 190 lb (86.2 kg), SpO2 97%. Body mass index is 35.9 kg/m.  DIAGNOSTIC DATA REVIEWED:  BMET    Component Value Date/Time   NA 144 12/25/2022 0906   K 4.8 12/25/2022 0906   CL 105 12/25/2022 0906   CO2 24 12/25/2022 0906   GLUCOSE 84 12/25/2022 0906   BUN 14 12/25/2022 0906   CREATININE 0.74 12/25/2022 0906   CALCIUM 10.1 12/25/2022 0906   GFRNONAA 68.67 04/24/2020 0000   GFRAA 83.10 04/24/2020 0000   Lab Results  Component Value Date   HGBA1C 5.7 (H) 12/25/2022   HGBA1C 5.8 (H) 07/14/2019   Lab Results  Component Value Date   INSULIN  7.6 12/25/2022   INSULIN  12.3 07/14/2019   Lab Results  Component Value Date   TSH 2.890 08/13/2021   CBC    Component Value Date/Time   WBC 5.4 12/30/2021 0906   RBC 4.13 12/30/2021 0906   RBC 3.93 10/03/2021 0000   HGB 13.2 12/30/2021 0906   HCT 39.6 12/30/2021 0906   PLT 271 12/30/2021 0906   MCV 96 12/30/2021 0906   MCH 32.0 12/30/2021 0906   MCHC 33.3 12/30/2021 0906   RDW 12.7  12/30/2021 0906   Iron Studies No results found for: IRON, TIBC, FERRITIN, IRONPCTSAT Lipid Panel     Component Value Date/Time   CHOL 223 (H) 12/25/2022 0906   TRIG 76 12/25/2022 0906   HDL 53 12/25/2022 0906   LDLCALC 157 (H) 12/25/2022 0906   Hepatic Function Panel     Component Value Date/Time   PROT 6.4 12/25/2022 0906   ALBUMIN 4.3 12/25/2022 0906   AST 17 12/25/2022 0906   ALT 18 12/25/2022 0906   ALKPHOS 69 12/25/2022 0906   BILITOT 0.3 12/25/2022 0906      Component Value Date/Time   TSH 2.890 08/13/2021 0816   Nutritional Lab Results   Component Value Date   VD25OH 46.0 12/25/2022   VD25OH 68.3 05/13/2022   VD25OH 34.0 12/30/2021     Assessment and Plan    Obesity Obesity with stable weight over the last two months. Discussed the importance of self-care and meal planning, especially in the context of caregiving responsibilities. Patient plans to focus more on her own nutrition and meal planning. - Work on meal planning to include appropriate options for both patient and her mother - Consider using a pescetarian meal plan for variety - Encourage journaling and regular exercise  Prediabetes Prediabetes with previous A1c of 5.7. Patient prefers to manage through diet and lifestyle changes. Discussed the impact of carbohydrates on insulin  and hunger. Explained that metformin  can help manage insulin  levels and hunger, but patient prefers to try dietary changes first. - Check A1c during lab work - Discuss potential use of metformin  if A1c worsens - Encourage maximizing protein intake to manage hunger  Hyperlipidemia Hyperlipidemia with previous labs indicating elevated cholesterol. No current medication for cholesterol management. - Check cholesterol levels during lab work  Vitamin D  Deficiency Vitamin D  deficiency managed with prescription vitamin D . Patient requests a refill. - Refill prescription vitamin D  - Check vitamin D  levels during lab work  Rash Rash on right thumb, possibly eczema or dyshidrotic eczema. Symptoms include cracking, bleeding, and itching. Condition has improved with hydration and occlusion. Discussed the use of topical steroids if condition does not improve. - Continue using Vaseline or similar occlusive agents three times a day - Use rubber gloves to minimize water exposure - Consider eVisit or visit with her PCP for further evaluation and potential prescription of topical steroids if condition does not improve  General Health Maintenance Routine health maintenance discussed, including lab  work and follow-up. - Perform lab work including vitamin D , cholesterol, A1c, kidney, liver, and electrolytes - Schedule follow-up visit in four weeks.       Louann Penton, MD

## 2023-06-17 LAB — CMP14+EGFR
ALT: 14 [IU]/L (ref 0–32)
AST: 16 [IU]/L (ref 0–40)
Albumin: 4.3 g/dL (ref 3.8–4.9)
Alkaline Phosphatase: 69 [IU]/L (ref 44–121)
BUN/Creatinine Ratio: 24 — ABNORMAL HIGH (ref 9–23)
BUN: 16 mg/dL (ref 6–24)
Bilirubin Total: 0.3 mg/dL (ref 0.0–1.2)
CO2: 24 mmol/L (ref 20–29)
Calcium: 10.1 mg/dL (ref 8.7–10.2)
Chloride: 106 mmol/L (ref 96–106)
Creatinine, Ser: 0.68 mg/dL (ref 0.57–1.00)
Globulin, Total: 2.1 g/dL (ref 1.5–4.5)
Glucose: 80 mg/dL (ref 70–99)
Potassium: 4.8 mmol/L (ref 3.5–5.2)
Sodium: 141 mmol/L (ref 134–144)
Total Protein: 6.4 g/dL (ref 6.0–8.5)
eGFR: 100 mL/min/{1.73_m2} (ref >59)

## 2023-06-17 LAB — LIPID PANEL WITH LDL/HDL RATIO
Cholesterol, Total: 220 mg/dL — ABNORMAL HIGH (ref 100–199)
HDL: 54 mg/dL (ref >39)
LDL Chol Calc (NIH): 156 mg/dL — ABNORMAL HIGH (ref 0–99)
LDL/HDL Ratio: 2.9 {ratio} (ref 0.0–3.2)
Triglycerides: 58 mg/dL (ref 0–149)
VLDL Cholesterol Cal: 10 mg/dL (ref 5–40)

## 2023-06-17 LAB — HEMOGLOBIN A1C
Est. average glucose Bld gHb Est-mCnc: 114 mg/dL
Hgb A1c MFr Bld: 5.6 % (ref 4.8–5.6)

## 2023-06-17 LAB — INSULIN, RANDOM: INSULIN: 4.6 u[IU]/mL (ref 2.6–24.9)

## 2023-06-17 LAB — VITAMIN B12: Vitamin B-12: 610 pg/mL (ref 232–1245)

## 2023-06-17 LAB — VITAMIN D 25 HYDROXY (VIT D DEFICIENCY, FRACTURES): Vit D, 25-Hydroxy: 67.4 ng/mL (ref 30.0–100.0)

## 2023-06-22 LAB — HM MAMMOGRAPHY

## 2023-07-14 ENCOUNTER — Telehealth: Payer: Self-pay | Admitting: Neurology

## 2023-07-14 NOTE — Telephone Encounter (Signed)
Pt had questions about positional device Made pt aware this particular device our office does not follow up with . Made pt  aware this is an out of pocket expense and insurance will not cover product Per Casey,RN less expensive to purchase a body pillow or wedge to keep pt off her back . Baird Lyons, RN had to speak to  with pt and made her aware we can give her the paper order and pt has to go where the device is sold Pt expressed understanding and will call the company that sells the positional device

## 2023-07-14 NOTE — Telephone Encounter (Signed)
Regarding message from 12/17 from [place], [place] pt is asking for a call to discuss the device.  Pt does not know the name of device or how to go about moving forward to get it, she would like a call to discuss.

## 2023-08-05 ENCOUNTER — Ambulatory Visit (INDEPENDENT_AMBULATORY_CARE_PROVIDER_SITE_OTHER): Payer: 59 | Admitting: Family Medicine

## 2023-09-02 ENCOUNTER — Ambulatory Visit (INDEPENDENT_AMBULATORY_CARE_PROVIDER_SITE_OTHER): Payer: 59 | Admitting: Family Medicine

## 2023-09-02 ENCOUNTER — Encounter (INDEPENDENT_AMBULATORY_CARE_PROVIDER_SITE_OTHER): Payer: Self-pay | Admitting: Family Medicine

## 2023-09-02 VITALS — BP 93/60 | HR 77 | Temp 98.3°F | Ht 61.0 in | Wt 190.0 lb

## 2023-09-02 DIAGNOSIS — R7303 Prediabetes: Secondary | ICD-10-CM | POA: Diagnosis not present

## 2023-09-02 DIAGNOSIS — E7849 Other hyperlipidemia: Secondary | ICD-10-CM

## 2023-09-02 DIAGNOSIS — E785 Hyperlipidemia, unspecified: Secondary | ICD-10-CM

## 2023-09-02 DIAGNOSIS — E669 Obesity, unspecified: Secondary | ICD-10-CM | POA: Diagnosis not present

## 2023-09-02 DIAGNOSIS — Z6835 Body mass index (BMI) 35.0-35.9, adult: Secondary | ICD-10-CM

## 2023-09-02 DIAGNOSIS — G4700 Insomnia, unspecified: Secondary | ICD-10-CM

## 2023-09-02 MED ORDER — TRAZODONE HCL 50 MG PO TABS: 25.0000 mg | ORAL_TABLET | Freq: Every evening | ORAL | 0 refills | Status: DC | PRN

## 2023-09-02 NOTE — Progress Notes (Signed)
 Office: [phone]  /  Fax: [phone]  WEIGHT SUMMARY AND BIOMETRICS  Anthropometric Measurements Height: 5\' 1"  (1.549 m) Weight: 190 lb (86.2 kg) BMI (Calculated): 35.92 Weight at Last Visit: 190 lb Weight Lost Since Last Visit: 0 Weight Gained Since Last Visit: 0 Starting Weight: 206 lb Total Weight Loss (lbs): 16 lb (7.258 kg)   Body Composition  Body Fat %: 46.3 % Fat Mass (lbs): 88 lbs Muscle Mass (lbs): 97 lbs Total Body Water (lbs): 74.8 lbs Visceral Fat Rating : 13   Other Clinical Data Fasting: No Labs: No Today's Visit #: 46 Starting Date: 07/14/19    Chief Complaint: OBESITY    History of Present Illness   Alexandria Ward is a 58 year old female who presents for obesity treatment plan and progress assessment.  She has not been journaling consistently, only about ten percent of the time, and has not been exercising. Despite this, she has managed to avoid gaining weight over the last three months. High stress levels and difficulty maintaining a consistent eating schedule due to her responsibilities, including caring for her mother and her mother's dog, are contributing factors.  She is dealing with significant stress related to her caregiving responsibilities for her mother, who has had surgery, and her aunt, who has been hospitalized. She is exploring options for assisted living for her mother to alleviate some of the caregiving burden. She feels overwhelmed and in 'survival mode' due to these responsibilities.  Her sleep is inadequate, citing various reasons including her dog and general life stressors. She often wakes up in the middle of the night and finds it difficult to return to sleep. She has tried melatonin but is not currently taking it regularly. She is considering other options to improve her sleep quality.  She has a history of prediabetes, hyperlipidemia, and vitamin D deficiency. Recent blood work shows stable cholesterol levels, with  LDL remaining high but triglycerides and HDL at good levels. Her hemoglobin A1c has improved from 5.7 to 5.6, and her insulin levels are under control. She has been working on reducing seed oils in her diet.          PHYSICAL EXAM:  Blood pressure 93/60, pulse 77, temperature 98.3 F (36.8 C), height 5\' 1"  (1.549 m), weight 190 lb (86.2 kg), SpO2 97%. Body mass index is 35.9 kg/m.  DIAGNOSTIC DATA REVIEWED:  BMET    Component Value Date/Time   NA 141 06/16/2023 0922   K 4.8 06/16/2023 0922   CL 106 06/16/2023 0922   CO2 24 06/16/2023 0922   GLUCOSE 80 06/16/2023 0922   BUN 16 06/16/2023 0922   CREATININE 0.68 06/16/2023 0922   CALCIUM 10.1 06/16/2023 0922   GFRNONAA 68.67 04/24/2020 0000   GFRAA 83.10 04/24/2020 0000   Lab Results  Component Value Date   HGBA1C 5.6 06/16/2023   HGBA1C 5.8 (H) 07/14/2019   Lab Results  Component Value Date   INSULIN 4.6 06/16/2023   INSULIN 12.3 07/14/2019   Lab Results  Component Value Date   TSH 2.890 08/13/2021   CBC    Component Value Date/Time   WBC 5.4 12/30/2021 0906   RBC 4.13 12/30/2021 0906   RBC 3.93 10/03/2021 0000   HGB 13.2 12/30/2021 0906   HCT 39.6 12/30/2021 0906   PLT 271 12/30/2021 0906   MCV 96 12/30/2021 0906   MCH 32.0 12/30/2021 0906   MCHC 33.3 12/30/2021 0906   RDW 12.7 12/30/2021 0906   Iron Studies No  results found for: "IRON", "TIBC", "FERRITIN", "IRONPCTSAT" Lipid Panel     Component Value Date/Time   CHOL 220 (H) 06/16/2023 0922   TRIG 58 06/16/2023 0922   HDL 54 06/16/2023 0922   LDLCALC 156 (H) 06/16/2023 0922   Hepatic Function Panel     Component Value Date/Time   PROT 6.4 06/16/2023 0922   ALBUMIN 4.3 06/16/2023 0922   AST 16 06/16/2023 0922   ALT 14 06/16/2023 0922   ALKPHOS 69 06/16/2023 0922   BILITOT 0.3 06/16/2023 0922      Component Value Date/Time   TSH 2.890 08/13/2021 0816   Nutritional Lab Results  Component Value Date   VD25OH 67.4 06/16/2023   VD25OH 46.0  12/25/2022   VD25OH 68.3 05/13/2022     Assessment and Plan    Obesity She has maintained her weight over the last three months despite inconsistent journaling and lack of exercise. Stress and sleep patterns are affecting her weight management. Intermittent fasting was considered but deemed potentially counterproductive. A modified carnivore diet was suggested to reset eating habits and control hunger by reducing insulin response. - Implement a modified carnivore diet for two weeks, with the option to extend for another two weeks if desired. - Encourage journaling and regular exercise.  Insomnia She experiences difficulty sleeping due to stress and caregiving responsibilities. She has tried melatonin but is not currently using it. Trazodone was discussed as a potential treatment for sleep and stress-related eating, with a note on possible morning drowsiness. She is open to trying trazodone as it is not expected to trigger migraines. - Start trazodone 50 mg, take half a pill at night initially, with the option to increase to a full pill if needed. - Take trazodone about an hour before bedtime. - Refill vitamin D prescription.  Prediabetes Her hemoglobin A1c has improved from 5.7 to 5.6, indicating better glucose control. Insulin levels are within a desirable range, suggesting effective management of her prediabetes. - Continue current dietary and lifestyle modifications to maintain or further improve glucose control.  Hyperlipidemia Her LDL cholesterol remains elevated, but HDL cholesterol and triglycerides are at desirable levels, indicating good dietary habits. Non-statin medications were mentioned as an option if LDL cholesterol does not improve, typically prescribed by a cardiologist. - Consider referral to a cardiologist for non-statin medication options if LDL cholesterol does not improve.         I have personally spent 40 minutes total time today in preparation, patient care, and  documentation for this visit, including the following: review of clinical lab tests; review of medical tests/procedures/services.    She was informed of the importance of frequent follow up visits to maximize her success with intensive lifestyle modifications for her multiple health conditions.    Quillian Quince, MD

## 2023-09-06 ENCOUNTER — Other Ambulatory Visit (INDEPENDENT_AMBULATORY_CARE_PROVIDER_SITE_OTHER): Payer: Self-pay | Admitting: Family Medicine

## 2023-09-06 DIAGNOSIS — E559 Vitamin D deficiency, unspecified: Secondary | ICD-10-CM

## 2023-09-07 ENCOUNTER — Other Ambulatory Visit: Payer: Self-pay | Admitting: *Deleted

## 2023-09-07 MED ORDER — ZOLMITRIPTAN 5 MG PO TABS: 5.0000 mg | ORAL_TABLET | ORAL | 6 refills | Status: AC | PRN

## 2023-09-07 MED ORDER — SUMATRIPTAN SUCCINATE 100 MG PO TABS: 100.0000 mg | ORAL_TABLET | ORAL | 3 refills | Status: AC | PRN

## 2023-09-27 ENCOUNTER — Other Ambulatory Visit (INDEPENDENT_AMBULATORY_CARE_PROVIDER_SITE_OTHER): Payer: Self-pay | Admitting: Family Medicine

## 2023-09-27 DIAGNOSIS — G4700 Insomnia, unspecified: Secondary | ICD-10-CM

## 2023-09-28 ENCOUNTER — Ambulatory Visit (INDEPENDENT_AMBULATORY_CARE_PROVIDER_SITE_OTHER): Payer: 59 | Admitting: Family Medicine

## 2023-10-19 ENCOUNTER — Encounter: Payer: Self-pay | Admitting: Internal Medicine

## 2023-10-19 ENCOUNTER — Ambulatory Visit (INDEPENDENT_AMBULATORY_CARE_PROVIDER_SITE_OTHER): Payer: 59 | Admitting: Internal Medicine

## 2023-10-19 VITALS — BP 102/74 | HR 75 | Temp 98.2°F | Ht 61.0 in | Wt 194.5 lb

## 2023-10-19 DIAGNOSIS — Z1159 Encounter for screening for other viral diseases: Secondary | ICD-10-CM

## 2023-10-19 DIAGNOSIS — E039 Hypothyroidism, unspecified: Secondary | ICD-10-CM | POA: Diagnosis not present

## 2023-10-19 DIAGNOSIS — Z0000 Encounter for general adult medical examination without abnormal findings: Secondary | ICD-10-CM | POA: Diagnosis not present

## 2023-10-19 DIAGNOSIS — Z114 Encounter for screening for human immunodeficiency virus [HIV]: Secondary | ICD-10-CM

## 2023-10-19 DIAGNOSIS — K219 Gastro-esophageal reflux disease without esophagitis: Secondary | ICD-10-CM

## 2023-10-19 DIAGNOSIS — E66812 Obesity, class 2: Secondary | ICD-10-CM | POA: Diagnosis not present

## 2023-10-19 DIAGNOSIS — Z636 Dependent relative needing care at home: Secondary | ICD-10-CM

## 2023-10-19 DIAGNOSIS — E7849 Other hyperlipidemia: Secondary | ICD-10-CM | POA: Diagnosis not present

## 2023-10-19 DIAGNOSIS — Z6838 Body mass index (BMI) 38.0-38.9, adult: Secondary | ICD-10-CM

## 2023-10-19 LAB — CBC WITH DIFFERENTIAL/PLATELET
Basophils Absolute: 0 10*3/uL (ref 0.0–0.1)
Basophils Relative: 0.9 % (ref 0.0–3.0)
Eosinophils Absolute: 0.2 10*3/uL (ref 0.0–0.7)
Eosinophils Relative: 3.1 % (ref 0.0–5.0)
HCT: 39.2 % (ref 36.0–46.0)
Hemoglobin: 13 g/dL (ref 12.0–15.0)
Lymphocytes Relative: 32.3 % (ref 12.0–46.0)
Lymphs Abs: 1.7 10*3/uL (ref 0.7–4.0)
MCHC: 33.3 g/dL (ref 30.0–36.0)
MCV: 95.8 fl (ref 78.0–100.0)
Monocytes Absolute: 0.6 10*3/uL (ref 0.1–1.0)
Monocytes Relative: 11.8 % (ref 3.0–12.0)
Neutro Abs: 2.8 10*3/uL (ref 1.4–7.7)
Neutrophils Relative %: 51.9 % (ref 43.0–77.0)
Platelets: 294 10*3/uL (ref 150.0–400.0)
RBC: 4.09 Mil/uL (ref 3.87–5.11)
RDW: 14.4 % (ref 11.5–15.5)
WBC: 5.4 10*3/uL (ref 4.0–10.5)

## 2023-10-19 LAB — COMPREHENSIVE METABOLIC PANEL WITH GFR
ALT: 15 U/L (ref 0–35)
AST: 17 U/L (ref 0–37)
Albumin: 4.2 g/dL (ref 3.5–5.2)
Alkaline Phosphatase: 59 U/L (ref 39–117)
BUN: 14 mg/dL (ref 6–23)
CO2: 28 meq/L (ref 19–32)
Calcium: 10 mg/dL (ref 8.4–10.5)
Chloride: 107 meq/L (ref 96–112)
Creatinine, Ser: 0.7 mg/dL (ref 0.40–1.20)
GFR: 94.68 mL/min (ref >60.00)
Glucose, Bld: 84 mg/dL (ref 70–99)
Potassium: 4.1 meq/L (ref 3.5–5.1)
Sodium: 142 meq/L (ref 135–145)
Total Bilirubin: 0.4 mg/dL (ref 0.2–1.2)
Total Protein: 6.7 g/dL (ref 6.0–8.3)

## 2023-10-19 LAB — TSH: TSH: 2.89 u[IU]/mL (ref 0.35–5.50)

## 2023-10-19 LAB — LIPID PANEL
Cholesterol: 227 mg/dL — ABNORMAL HIGH (ref 0–200)
HDL: 53.8 mg/dL (ref >39.00)
LDL Cholesterol: 159 mg/dL — ABNORMAL HIGH (ref 0–99)
NonHDL: 173.65
Total CHOL/HDL Ratio: 4
Triglycerides: 71 mg/dL (ref 0.0–149.0)
VLDL: 14.2 mg/dL (ref 0.0–40.0)

## 2023-10-19 LAB — HEMOGLOBIN A1C: Hgb A1c MFr Bld: 5.8 % (ref 4.6–6.5)

## 2023-10-19 LAB — VITAMIN B12: Vitamin B-12: 366 pg/mL (ref 211–911)

## 2023-10-19 LAB — VITAMIN D 25 HYDROXY (VIT D DEFICIENCY, FRACTURES): VITD: 34.86 ng/mL (ref 30.00–100.00)

## 2023-10-19 MED ORDER — OMEPRAZOLE 40 MG PO CPDR: 40.0000 mg | DELAYED_RELEASE_CAPSULE | Freq: Every day | ORAL | 1 refills | Status: DC

## 2023-10-19 NOTE — Progress Notes (Signed)
 Established Patient Office Visit     CC/Reason for Visit: Annual preventive exam  HPI: Alexandria Ward is a 58 y.o. female who is coming in today for the above mentioned reasons. Past Medical History is significant for: Hypothyroidism, vitamin D  deficiency, hyperlipidemia, GERD, obesity, migraine headaches.  Has routine eye and dental care.  Due for Tdap and shingles vaccines.  Her last colonoscopy was with [facility].  She had mammogram with GYN.  She has a lot of caregiver stress.  She is caring for a mother with progressive cognitive decline.   Past Medical/Surgical History: Past Medical History:  Diagnosis Date   Anemia    Asthma    Back pain    Constipation    Depression    Dyspnea    Fatigue    Fluid retention    Hyperlipidemia    Hypothyroidism    IBS (irritable bowel syndrome)    Joint pain    Lower extremity edema    Migraines    Palpitations    Shortness of breath    Tachycardia    Thyroid  disease    Vitamin B 12 deficiency    Vitamin D  deficiency     Past Surgical History:  Procedure Laterality Date   APPENDECTOMY     CARPAL TUNNEL RELEASE Bilateral    2024   endometrial ablasion      Social History:  reports that she has never smoked. She has never used smokeless tobacco. She reports that she does not currently use alcohol. She reports that she does not use drugs.  Allergies: Allergies  Allergen Reactions   Betadine [Povidone Iodine]    Azithromycin Rash   Ciprofloxacin Rash   Penicillins Rash    Cant remember the reaction or the severity of it   Tape Rash    Family History:  Family History  Problem Relation Age of Onset   Restless legs syndrome Mother    Arthritis Mother    Neuropathy Mother    Hyperlipidemia Mother    Depression Mother    Migraines Mother    Heart attack Father      Current Outpatient Medications:    aluminum chloride (DRYSOL) 20 % external solution, Apply topically at bedtime., Disp: 35 mL, Rfl: 0   b  complex vitamins capsule, Take 1 capsule by mouth daily., Disp: , Rfl:    Digestive Enzymes (DIGESTIVE ENZYME PO), Take 500 mg by mouth., Disp: , Rfl:    fluticasone (FLONASE) 50 MCG/ACT nasal spray, Place into the nose., Disp: , Rfl:    Fremanezumab -vfrm (AJOVY ) 225 MG/1.5ML SOAJ, Inject 225 mg into the skin every 30 (thirty) days., Disp: 4.5 mL, Rfl: 3   magnesium gluconate (MAGONATE) 500 MG tablet, Take 500 mg by mouth daily., Disp: , Rfl:    metFORMIN  (GLUCOPHAGE ) 500 MG tablet, Take 1 tablet (500 mg total) by mouth daily with breakfast., Disp: 90 tablet, Rfl: 0   Multiple Vitamins-Minerals (HAIR/SKIN/NAILS/BIOTIN PO), Take 1 tablet by mouth daily., Disp: , Rfl:    ondansetron  (ZOFRAN  ODT) 4 MG disintegrating tablet, Take 1 tablet (4 mg total) by mouth every 8 (eight) hours as needed., Disp: 20 tablet, Rfl: 1   SUMAtriptan  (IMITREX ) 100 MG tablet, Take 1 tablet (100 mg total) by mouth every 2 (two) hours as needed for migraine. May repeat in 2 hours if headache persists or recurs. Do not exceed 2 does in 24 hours or 10 doses in 1 month., Disp: 27 tablet, Rfl: 3  thyroid  (ARMOUR) 15 MG tablet, Take 15 mg by mouth daily., Disp: , Rfl:    traZODone  (DESYREL ) 50 MG tablet, Take 0.5-1 tablets (25-50 mg total) by mouth at bedtime as needed for sleep., Disp: 30 tablet, Rfl: 0   Vitamin D , Ergocalciferol , (DRISDOL ) 1.25 MG (50000 UNIT) CAPS capsule, Take 1 capsule (50,000 Units total) by mouth every 7 (seven) days., Disp: 13 capsule, Rfl: 0   zolmitriptan  (ZOMIG ) 5 MG tablet, Take 1 tablet (5 mg total) by mouth as needed for migraine., Disp: 10 tablet, Rfl: 6   omeprazole  (PRILOSEC) 40 MG capsule, Take 1 capsule (40 mg total) by mouth daily., Disp: 90 capsule, Rfl: 1  Review of Systems:  Negative unless indicated in HPI.   Physical Exam: Vitals:   10/19/23 0808  BP: 102/74  Pulse: 75  Temp: 98.2 F (36.8 C)  TempSrc: Oral  SpO2: 98%  Weight: 194 lb 8 oz (88.2 kg)  Height: 5\' 1"  (1.549 m)     Body mass index is 36.75 kg/m.   Physical Exam Vitals reviewed.  Constitutional:      General: She is not in acute distress.    Appearance: Normal appearance. She is obese. She is not ill-appearing, toxic-appearing or diaphoretic.  HENT:     Head: Normocephalic.     Right Ear: Tympanic membrane, ear canal and external ear normal. There is no impacted cerumen.     Left Ear: Tympanic membrane, ear canal and external ear normal. There is no impacted cerumen.     Nose: Nose normal.     Mouth/Throat:     Mouth: Mucous membranes are moist.     Pharynx: Oropharynx is clear. No oropharyngeal exudate or posterior oropharyngeal erythema.  Eyes:     General: No scleral icterus.       Right eye: No discharge.        Left eye: No discharge.     Conjunctiva/sclera: Conjunctivae normal.     Pupils: Pupils are equal, round, and reactive to light.  Neck:     Vascular: No carotid bruit.  Cardiovascular:     Rate and Rhythm: Normal rate and regular rhythm.     Pulses: Normal pulses.     Heart sounds: Normal heart sounds.  Pulmonary:     Effort: Pulmonary effort is normal. No respiratory distress.     Breath sounds: Normal breath sounds.  Abdominal:     General: Abdomen is flat. Bowel sounds are normal.     Palpations: Abdomen is soft.  Musculoskeletal:        General: Normal range of motion.     Cervical back: Normal range of motion.  Skin:    General: Skin is warm and dry.  Neurological:     General: No focal deficit present.     Mental Status: She is alert and oriented to person, place, and time. Mental status is at baseline.  Psychiatric:        Mood and Affect: Mood normal.        Behavior: Behavior normal.        Thought Content: Thought content normal.        Judgment: Judgment normal.     Flowsheet Row Office Visit from 04/02/2023 in [facility]  PHQ-9 Total Score 10        Impression and Plan:  Encounter for preventive health  examination  Gastroesophageal reflux disease, unspecified whether esophagitis present -     Omeprazole ; Take 1 capsule (40  mg total) by mouth daily.  Dispense: 90 capsule; Refill: 1  Hyperlipidemia, pure -     Lipid panel; Future  Hypothyroidism (acquired) -     CBC with Differential/Platelet; Future -     Comprehensive metabolic panel with GFR; Future -     TSH; Future  Class 2 severe obesity due to excess calories with serious comorbidity and body mass index (BMI) of 38.0 to 38.9 in adult (HCC) -     Hemoglobin A1c; Future -     Vitamin B12; Future -     VITAMIN D  25 Hydroxy (Vit-D Deficiency, Fractures); Future  Caregiver stress  Encounter for hepatitis C screening test for low risk patient -     Hepatitis C antibody; Future  Encounter for screening for HIV -     HIV Antibody (routine testing w rflx); Future   -Recommend routine eye and dental care. -Healthy lifestyle discussed in detail. -Labs to be updated today. -Prostate cancer screening: N/A Health Maintenance  Topic Date Due   HIV Screening  Never done   Hepatitis C Screening  Never done   DTaP/Tdap/Td vaccine (1 - Tdap) Never done   Pap with HPV screening  Never done   Colon Cancer Screening  Never done   Mammogram  05/24/2014   Zoster (Shingles) Vaccine (1 of 2) Never done   COVID-19 Vaccine (1 - 2024-25 season) Never done   Flu Shot  01/15/2024   HPV Vaccine  Aged Out   Meningitis B Vaccine  Aged Out     - Due for Tdap and shingles, declines today. - Obtain records from cancer screening from GI and GYN.     Marguerita Shih, MD  Primary Care at [facility]

## 2023-10-20 LAB — HEPATITIS C ANTIBODY: Hepatitis C Ab: NONREACTIVE

## 2023-10-20 LAB — HIV ANTIBODY (ROUTINE TESTING W REFLEX): HIV 1&2 Ab, 4th Generation: NONREACTIVE

## 2023-10-21 ENCOUNTER — Encounter: Payer: Self-pay | Admitting: Internal Medicine

## 2023-10-22 MED ORDER — THYROID 15 MG PO TABS: 15.0000 mg | ORAL_TABLET | Freq: Every day | ORAL | 1 refills | Status: DC

## 2023-10-26 ENCOUNTER — Encounter (INDEPENDENT_AMBULATORY_CARE_PROVIDER_SITE_OTHER): Payer: Self-pay | Admitting: Family Medicine

## 2023-10-26 ENCOUNTER — Ambulatory Visit (INDEPENDENT_AMBULATORY_CARE_PROVIDER_SITE_OTHER): Admitting: Family Medicine

## 2023-10-26 VITALS — BP 135/79 | HR 79 | Temp 98.2°F | Ht 61.0 in | Wt 190.0 lb

## 2023-10-26 DIAGNOSIS — E66812 Obesity, class 2: Secondary | ICD-10-CM

## 2023-10-26 DIAGNOSIS — Z6835 Body mass index (BMI) 35.0-35.9, adult: Secondary | ICD-10-CM

## 2023-10-26 DIAGNOSIS — G4700 Insomnia, unspecified: Secondary | ICD-10-CM | POA: Diagnosis not present

## 2023-10-26 DIAGNOSIS — R5383 Other fatigue: Secondary | ICD-10-CM

## 2023-10-26 DIAGNOSIS — E88819 Insulin resistance, unspecified: Secondary | ICD-10-CM | POA: Diagnosis not present

## 2023-10-26 DIAGNOSIS — E669 Obesity, unspecified: Secondary | ICD-10-CM | POA: Diagnosis not present

## 2023-10-26 DIAGNOSIS — Z636 Dependent relative needing care at home: Secondary | ICD-10-CM

## 2023-10-26 DIAGNOSIS — E559 Vitamin D deficiency, unspecified: Secondary | ICD-10-CM

## 2023-10-26 DIAGNOSIS — Z6838 Body mass index (BMI) 38.0-38.9, adult: Secondary | ICD-10-CM

## 2023-10-26 DIAGNOSIS — Z6836 Body mass index (BMI) 36.0-36.9, adult: Secondary | ICD-10-CM

## 2023-10-26 MED ORDER — TRAZODONE HCL 50 MG PO TABS: 25.0000 mg | ORAL_TABLET | Freq: Every evening | ORAL | 0 refills | Status: DC | PRN

## 2023-10-26 MED ORDER — VITAMIN D (ERGOCALCIFEROL) 1.25 MG (50000 UNIT) PO CAPS: 50000.0000 [IU] | ORAL_CAPSULE | ORAL | 0 refills | Status: DC

## 2023-10-26 NOTE — Progress Notes (Signed)
 Office: [phone]  /  Fax: [phone]  WEIGHT SUMMARY AND BIOMETRICS  Anthropometric Measurements Height: 5\' 1"  (1.549 m) Weight: 190 lb (86.2 kg) BMI (Calculated): 35.92 Weight at Last Visit: 190 lb Weight Lost Since Last Visit: 0 Weight Gained Since Last Visit: 0 Starting Weight: 206 lb Total Weight Loss (lbs): 16 lb (7.258 kg) Peak Weight: 206 lb   Body Composition  Body Fat %: 46.3 % Fat Mass (lbs): 88.2 lbs Muscle Mass (lbs): 97.2 lbs Total Body Water (lbs): 75 lbs Visceral Fat Rating : 13   Other Clinical Data Fasting: no Labs: no Today's Visit #: 47 Starting Date: 07/14/19    Chief Complaint: OBESITY   History of Present Illness Alexandria Ward is a 58 year old female who presents for a follow-up on her obesity treatment plan.  She has been following a modified carnivore diet but adheres to it only about ten percent of the time. She is not currently engaging in any exercise. Despite these challenges, she has maintained her weight over the last two months since her last visit.  She experiences significant caregiver fatigue as she is heavily involved in caring for her 19 year old mother, who is considering ankle fusion surgery. Her mother's mobility is a concern, and she is exploring options for assisted living and rehabilitation. This caregiving responsibility has impacted her ability to focus on her own health and weight loss efforts.  She has a history of insulin  resistance, with a recent A1c of 5.8. She has previously tried weight loss medications but experienced adverse effects, including gastrointestinal discomfort. She is cautious about trying metformin  due to past issues with inflammatory bowel disease and Crohn's disease, fearing potential stomach problems.  She experiences insomnia and has been prescribed trazodone , which she has not yet tried. She is concerned about starting it on a work night and plans to try it on a weekend.  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 She also manages household responsibilities, including caring for her mother's dog, which has its own health issues.      PHYSICAL EXAM:  Blood pressure 135/79, pulse 79, temperature 98.2 F (36.8 C), height 5\' 1"  (1.549 m), weight 190 lb (86.2 kg), SpO2 98%. Body mass index is 35.9 kg/m.  DIAGNOSTIC DATA REVIEWED:  BMET    Component Value Date/Time   NA 142 10/19/2023 0836   NA 141 06/16/2023 0922   K 4.1 10/19/2023 0836   CL 107 10/19/2023 0836   CO2 28 10/19/2023 0836   GLUCOSE 84 10/19/2023 0836   BUN 14 10/19/2023 0836   BUN 16 06/16/2023 0922   CREATININE 0.70 10/19/2023 0836   CALCIUM 10.0 10/19/2023 0836   GFRNONAA 68.67 04/24/2020 0000   GFRAA 83.10 04/24/2020 0000   Lab Results  Component Value Date   HGBA1C 5.8 10/19/2023   HGBA1C 5.8 (H) 07/14/2019   Lab Results  Component Value Date   INSULIN  4.6 06/16/2023   INSULIN  12.3 07/14/2019   Lab Results  Component Value Date   TSH 2.89 10/19/2023   CBC    Component Value Date/Time   WBC 5.4 10/19/2023 0836   RBC 4.09 10/19/2023 0836   HGB 13.0 10/19/2023 0836   HGB 13.2 12/30/2021 0906   HCT 39.2 10/19/2023 0836   HCT 39.6 12/30/2021 0906   PLT 294.0 10/19/2023 0836   PLT 271 12/30/2021 0906   MCV 95.8 10/19/2023 0836   MCV 96 12/30/2021  0906   MCH 32.0 12/30/2021 0906   MCHC 33.3 10/19/2023 0836   RDW 14.4 10/19/2023 0836   RDW 12.7 12/30/2021 0906   Iron Studies No results found for: "IRON", "TIBC", "FERRITIN", "IRONPCTSAT" Lipid Panel     Component Value Date/Time   CHOL 227 (H) 10/19/2023 0836   CHOL 220 (H) 06/16/2023 0922   TRIG 71.0 10/19/2023 0836   HDL 53.80 10/19/2023 0836   HDL 54 06/16/2023 0922   CHOLHDL 4 10/19/2023 0836   VLDL 14.2 10/19/2023 0836   LDLCALC 159 (H) 10/19/2023 0836   LDLCALC 156 (H) 06/16/2023 0922   Hepatic Function Panel     Component Value  Date/Time   PROT 6.7 10/19/2023 0836   PROT 6.4 06/16/2023 0922   ALBUMIN 4.2 10/19/2023 0836   ALBUMIN 4.3 06/16/2023 0922   AST 17 10/19/2023 0836   ALT 15 10/19/2023 0836   ALKPHOS 59 10/19/2023 0836   BILITOT 0.4 10/19/2023 0836   BILITOT 0.3 06/16/2023 0922      Component Value Date/Time   TSH 2.89 10/19/2023 0836   Nutritional Lab Results  Component Value Date   VD25OH 34.86 10/19/2023   VD25OH 67.4 06/16/2023   VD25OH 46.0 12/25/2022     Assessment and Plan Assessment & Plan Insulin  resistance Insulin  resistance is a concern with an A1c of 5.8. She is hesitant to use metformin  due to gastrointestinal issues and IBD, including Crohn's disease. Metformin 's gastrointestinal side effects are dose-related and less likely with a full meal and low simple carb diet. 80% of individuals do not experience gastrointestinal issues with metformin . - Keep metformin  prescription available for future use if desired. - Monitor dietary habits to manage insulin  resistance.  Obesity Obesity management is ongoing. She follows a modified carnivore diet but adheres to it 10% of the time. No current exercise. Weight has been maintained over the last two months. Discussed the importance of a balanced diet and risks of long-term exclusion of major food groups, such as reduced fiber and vitamin intake. Emphasized a sustainable routine that fits her lifestyle and responsibilities. - Encourage adherence to a balanced diet with adequate fiber and vitamins. - Advise starting with small, manageable exercise routines, even 5-10 minutes daily. - Focus on protein intake as a starting point for dietary changes.   Vit D deficiency Stable on vit D - Refill vit D today  Insomnia Insomnia is ongoing. She has not tried trazodone  but is considering it. Discussed starting trazodone  on a weekend to monitor effects, beginning with half a pill to minimize side effects. - Encourage trial of trazodone  for insomnia,  starting with half a pill on a weekend.  Caregiver fatigue Significant caregiver fatigue due to responsibilities for her mother, who has mobility issues and potential need for assisted living. Discussed the emotional and physical toll of caregiving and the importance of self-care. Emphasized prioritizing her own health amidst caregiving duties. Discussed potential need for assisted living for her mother and planning for her mother's future care needs. - Encourage self-care and setting boundaries to manage caregiver fatigue. - Discuss potential for physical therapy referral if needed for personal health management.  Follow-up Follow-up is necessary to monitor progress and adjust treatment plans as needed. - Schedule follow-up appointment in June or July, based on her preference.    She was informed of the importance of frequent follow up visits to maximize her success with intensive lifestyle modifications for her multiple health conditions.    Jasmine Mesi, MD

## 2023-10-28 ENCOUNTER — Telehealth: Payer: Self-pay | Admitting: Internal Medicine

## 2023-10-28 NOTE — Telephone Encounter (Signed)
 Pt called wanting to speak to the Practice Admin however I took the call. Pt wanted to submit a complaint stating that there should be another protocol to ensure pts personal info is safe.   Pt had an encounter with a lab technician on 5/5 where Tech ask to confirm pt DOB and pt stated she said it very quietly because there was another person in the room with her. Tech then gave pt a paper to confirm her info and pt stated that tech was rude saying that "this is protocol and this is what they have to do to complete their job."   Pt is just wanting to see if we can change our protocol to only confirming pts identity through paper form and perhaps having only one person in the lab room at a time. Let pt know that this is the way that we have to confirm that she is who she say she is and that she has to confirm her DOB with check-in as well. Also let pt know that we have lots of pts coming in so that's why we pull more than one person for labs.   Pt understood and stated that she just wanted to see if the protocol can be a lot more private. Pt ended the call by saying she would like to speak to the Prac Admin whenever she calls in and requests to speak with her especially when pt provided info on why she needed to speak with her.   FYI

## 2023-10-29 ENCOUNTER — Telehealth: Payer: Self-pay | Admitting: Neurology

## 2023-10-29 NOTE — Telephone Encounter (Signed)
 LVM and sent mychart msg informing pt of appt change - MD schedule change

## 2023-11-29 ENCOUNTER — Other Ambulatory Visit (INDEPENDENT_AMBULATORY_CARE_PROVIDER_SITE_OTHER): Payer: Self-pay | Admitting: Family Medicine

## 2023-11-29 DIAGNOSIS — G4700 Insomnia, unspecified: Secondary | ICD-10-CM

## 2023-12-01 ENCOUNTER — Encounter (INDEPENDENT_AMBULATORY_CARE_PROVIDER_SITE_OTHER): Payer: Self-pay | Admitting: Family Medicine

## 2023-12-01 ENCOUNTER — Ambulatory Visit (INDEPENDENT_AMBULATORY_CARE_PROVIDER_SITE_OTHER): Admitting: Family Medicine

## 2023-12-01 VITALS — BP 99/63 | HR 110 | Temp 98.9°F | Ht 61.0 in | Wt 189.0 lb

## 2023-12-01 DIAGNOSIS — E860 Dehydration: Secondary | ICD-10-CM

## 2023-12-01 DIAGNOSIS — E559 Vitamin D deficiency, unspecified: Secondary | ICD-10-CM | POA: Diagnosis not present

## 2023-12-01 DIAGNOSIS — R5383 Other fatigue: Secondary | ICD-10-CM

## 2023-12-01 DIAGNOSIS — Z6835 Body mass index (BMI) 35.0-35.9, adult: Secondary | ICD-10-CM

## 2023-12-01 DIAGNOSIS — E88819 Insulin resistance, unspecified: Secondary | ICD-10-CM

## 2023-12-01 DIAGNOSIS — E669 Obesity, unspecified: Secondary | ICD-10-CM

## 2023-12-01 MED ORDER — VITAMIN D (ERGOCALCIFEROL) 1.25 MG (50000 UNIT) PO CAPS: 50000.0000 [IU] | ORAL_CAPSULE | ORAL | 0 refills | Status: DC

## 2023-12-01 NOTE — Progress Notes (Signed)
 Office: [phone]  /  Fax: [phone]  WEIGHT SUMMARY AND BIOMETRICS  Anthropometric Measurements Height: 5' 1 (1.549 m) Weight: 189 lb (85.7 kg) BMI (Calculated): 35.73 Weight at Last Visit: 190 lb Weight Lost Since Last Visit: 1 lb Weight Gained Since Last Visit: 0 Starting Weight: 206 lb Total Weight Loss (lbs): 17 lb (7.711 kg) Peak Weight: 206 lb   Body Composition  Body Fat %: 44.6 % Fat Mass (lbs): 84.4 lbs Muscle Mass (lbs): 99.6 lbs Total Body Water (lbs): 75 lbs Visceral Fat Rating : 13   Other Clinical Data Fasting: no Labs: no Today's Visit #: 48 Starting Date: 07/14/19    Chief Complaint: OBESITY    History of Present Illness Alexandria Ward is a 58 year old female who presents for obesity treatment.  She has been following a low carbohydrate eating plan consistently but has not been engaging in physical exercise. Despite these efforts, she has only lost one pound in the last month since her last visit.  She experiences episodes of lightheadedness and dizziness, which she attributes to possible dehydration. Her blood pressure tends to run low, and she has episodes of feeling off balance.  She is currently taking prescription vitamin D  but is unsure about her adherence to the regimen. She is no longer taking metformin .  She is experiencing caregiver fatigue due to her involvement in the care of her elderly mother and is considering assisted living options. She has started seeing a therapist through her workplace, which she finds beneficial.  In terms of social activities, she has signed up for a Meet[employer] group to engage in social activities and is considering attending a Fourth of July picnic.      PHYSICAL EXAM:  Blood pressure 99/63, pulse (!) 110, temperature 98.9 F (37.2 C), height 5' 1 (1.549 m), weight 189 lb (85.7 kg), SpO2 97%. Body mass index is 35.71 kg/m.  DIAGNOSTIC DATA REVIEWED:  BMET    Component Value Date/Time    NA 142 10/19/2023 0836   NA 141 06/16/2023 0922   K 4.1 10/19/2023 0836   CL 107 10/19/2023 0836   CO2 28 10/19/2023 0836   GLUCOSE 84 10/19/2023 0836   BUN 14 10/19/2023 0836   BUN 16 06/16/2023 0922   CREATININE 0.70 10/19/2023 0836   CALCIUM 10.0 10/19/2023 0836   GFRNONAA 68.67 04/24/2020 0000   GFRAA 83.10 04/24/2020 0000   Lab Results  Component Value Date   HGBA1C 5.8 10/19/2023   HGBA1C 5.8 (H) 07/14/2019   Lab Results  Component Value Date   INSULIN  4.6 06/16/2023   INSULIN  12.3 07/14/2019   Lab Results  Component Value Date   TSH 2.89 10/19/2023   CBC    Component Value Date/Time   WBC 5.4 10/19/2023 0836   RBC 4.09 10/19/2023 0836   HGB 13.0 10/19/2023 0836   HGB 13.2 12/30/2021 0906   HCT 39.2 10/19/2023 0836   HCT 39.6 12/30/2021 0906   PLT 294.0 10/19/2023 0836   PLT 271 12/30/2021 0906   MCV 95.8 10/19/2023 0836   MCV 96 12/30/2021 0906   MCH 32.0 12/30/2021 0906   MCHC 33.3 10/19/2023 0836   RDW 14.4 10/19/2023 0836   RDW 12.7 12/30/2021 0906   Iron Studies No results found for: IRON, TIBC, FERRITIN, IRONPCTSAT Lipid Panel     Component Value Date/Time   CHOL 227 (H) 10/19/2023 0836   CHOL 220 (H) 06/16/2023 0922   TRIG 71.0 10/19/2023 0836   HDL 53.80  10/19/2023 0836   HDL 54 06/16/2023 0922   CHOLHDL 4 10/19/2023 0836   VLDL 14.2 10/19/2023 0836   LDLCALC 159 (H) 10/19/2023 0836   LDLCALC 156 (H) 06/16/2023 0922   Hepatic Function Panel     Component Value Date/Time   PROT 6.7 10/19/2023 0836   PROT 6.4 06/16/2023 0922   ALBUMIN 4.2 10/19/2023 0836   ALBUMIN 4.3 06/16/2023 0922   AST 17 10/19/2023 0836   ALT 15 10/19/2023 0836   ALKPHOS 59 10/19/2023 0836   BILITOT 0.4 10/19/2023 0836   BILITOT 0.3 06/16/2023 0922      Component Value Date/Time   TSH 2.89 10/19/2023 0836   Nutritional Lab Results  Component Value Date   VD25OH 34.86 10/19/2023   VD25OH 67.4 06/16/2023   VD25OH 46.0 12/25/2022      Assessment and Plan Assessment & Plan Dehydration Lightheadedness and dizziness likely due to dehydration, indicated by slightly low blood pressure and elevated heart rate, exacerbated by warmer weather and increased activity. - Increase water intake, especially with warmer weather and increased activity.  Obesity Following a low carbohydrate diet with a one-pound weight loss over the last month. Lack of regular exercise noted. Emphasized the importance of incorporating exercise to aid weight loss and improve overall health. - Continue low carbohydrate diet plan. - Incorporate exercise, even in small amounts, into daily routine.  Insulin  Resistance Discontinued metformin . Management focuses on diet, exercise, and weight loss. Progress to be monitored through follow-up labs. - Manage insulin  resistance through diet, exercise, and weight loss. - Monitor progress with follow-up labs.  Vitamin D  Deficiency On prescription vitamin D  but unsure of regular intake. Plans to ensure adequate supply for continued treatment. - Refill prescription vitamin D .  Caregiver Fatigue Experiencing caregiver fatigue due to responsibilities and stressors, including caring for her mother. Benefiting from therapy through her workplace. Encouraged to continue therapy and prioritize self-care to prevent burnout. - Continue therapy sessions. - Prioritize self-care to prevent burnout.      She was informed of the importance of frequent follow up visits to maximize her success with intensive lifestyle modifications for her multiple health conditions.    Jasmine Mesi, MD

## 2023-12-28 ENCOUNTER — Encounter (INDEPENDENT_AMBULATORY_CARE_PROVIDER_SITE_OTHER): Payer: Self-pay | Admitting: Family Medicine

## 2023-12-28 ENCOUNTER — Ambulatory Visit (INDEPENDENT_AMBULATORY_CARE_PROVIDER_SITE_OTHER): Admitting: Family Medicine

## 2023-12-28 VITALS — BP 104/69 | HR 85 | Temp 98.2°F | Ht 61.0 in | Wt 192.0 lb

## 2023-12-28 DIAGNOSIS — Z6836 Body mass index (BMI) 36.0-36.9, adult: Secondary | ICD-10-CM | POA: Diagnosis not present

## 2023-12-28 DIAGNOSIS — F4321 Adjustment disorder with depressed mood: Secondary | ICD-10-CM

## 2023-12-28 DIAGNOSIS — E669 Obesity, unspecified: Secondary | ICD-10-CM

## 2023-12-28 DIAGNOSIS — R5383 Other fatigue: Secondary | ICD-10-CM | POA: Diagnosis not present

## 2023-12-28 NOTE — Progress Notes (Signed)
 Office: [phone]  /  Fax: [phone]  WEIGHT SUMMARY AND BIOMETRICS  Anthropometric Measurements Height: 5' 1 (1.549 m) Weight: 192 lb (87.1 kg) BMI (Calculated): 36.3 Weight at Last Visit: 189 lb Weight Lost Since Last Visit: 0 Weight Gained Since Last Visit: 3 lb Starting Weight: 206 lb Total Weight Loss (lbs): 14 lb (6.35 kg) Peak Weight: 206 lb   Body Composition  Body Fat %: 45.8 % Fat Mass (lbs): 88 lbs Muscle Mass (lbs): 99 lbs Total Body Water (lbs): 74.4 lbs Visceral Fat Rating : 13   Other Clinical Data Fasting: no Labs: no Today's Visit #: 49 Starting Date: 07/14/19    Chief Complaint: OBESITY    History of Present Illness Alexandria Ward is a 58 year old female who presents to discuss her obesity treatment plan.  She struggles to adhere to a low carbohydrate diet, following it about 30% of the time. She has not been exercising recently and has gained three pounds since her last visit one month ago. She reports weight gain and notes that she has been eating 'light ice cream'.  She is experiencing significant stress related to caregiving responsibilities and recent grief. She manages the care of her mother and is exploring assisted living options. Additionally, she recently had to euthanize a family pet, which has been emotionally taxing. She feels overwhelmed by 'so many things at once', impacting her ability to focus on her health and dietary needs.  She tries to stay hydrated and has purchased decaffeinated teas with zero sugar to help with fluid intake. She is attempting to increase her protein intake but finds it challenging to consistently meet her dietary goals.  She has a history of eczema, which has been exacerbated by stress. Her hands are dry and itchy despite using prescribed creams and rotating treatments. She has tried wearing cotton gloves but finds them uncomfortable, especially in the summer.  She is also dealing with the  logistics of her mother's potential move to assisted living and the care of her aunt, who also requires assistance. This has added to her stress and impacted her ability to focus on her own health.      PHYSICAL EXAM:  Blood pressure 104/69, pulse 85, temperature 98.2 F (36.8 C), height 5' 1 (1.549 m), weight 192 lb (87.1 kg), SpO2 100%. Body mass index is 36.28 kg/m.  DIAGNOSTIC DATA REVIEWED:  BMET    Component Value Date/Time   NA 142 10/19/2023 0836   NA 141 06/16/2023 0922   K 4.1 10/19/2023 0836   CL 107 10/19/2023 0836   CO2 28 10/19/2023 0836   GLUCOSE 84 10/19/2023 0836   BUN 14 10/19/2023 0836   BUN 16 06/16/2023 0922   CREATININE 0.70 10/19/2023 0836   CALCIUM 10.0 10/19/2023 0836   GFRNONAA 68.67 04/24/2020 0000   GFRAA 83.10 04/24/2020 0000   Lab Results  Component Value Date   HGBA1C 5.8 10/19/2023   HGBA1C 5.8 (H) 07/14/2019   Lab Results  Component Value Date   INSULIN  4.6 06/16/2023   INSULIN  12.3 07/14/2019   Lab Results  Component Value Date   TSH 2.89 10/19/2023   CBC    Component Value Date/Time   WBC 5.4 10/19/2023 0836   RBC 4.09 10/19/2023 0836   HGB 13.0 10/19/2023 0836   HGB 13.2 12/30/2021 0906   HCT 39.2 10/19/2023 0836   HCT 39.6 12/30/2021 0906   PLT 294.0 10/19/2023 0836   PLT 271 12/30/2021 0906   MCV  95.8 10/19/2023 0836   MCV 96 12/30/2021 0906   MCH 32.0 12/30/2021 0906   MCHC 33.3 10/19/2023 0836   RDW 14.4 10/19/2023 0836   RDW 12.7 12/30/2021 0906   Iron Studies No results found for: IRON, TIBC, FERRITIN, IRONPCTSAT Lipid Panel     Component Value Date/Time   CHOL 227 (H) 10/19/2023 0836   CHOL 220 (H) 06/16/2023 0922   TRIG 71.0 10/19/2023 0836   HDL 53.80 10/19/2023 0836   HDL 54 06/16/2023 0922   CHOLHDL 4 10/19/2023 0836   VLDL 14.2 10/19/2023 0836   LDLCALC 159 (H) 10/19/2023 0836   LDLCALC 156 (H) 06/16/2023 0922   Hepatic Function Panel     Component Value Date/Time   PROT 6.7  10/19/2023 0836   PROT 6.4 06/16/2023 0922   ALBUMIN 4.2 10/19/2023 0836   ALBUMIN 4.3 06/16/2023 0922   AST 17 10/19/2023 0836   ALT 15 10/19/2023 0836   ALKPHOS 59 10/19/2023 0836   BILITOT 0.4 10/19/2023 0836   BILITOT 0.3 06/16/2023 0922      Component Value Date/Time   TSH 2.89 10/19/2023 0836   Nutritional Lab Results  Component Value Date   VD25OH 34.86 10/19/2023   VD25OH 67.4 06/16/2023   VD25OH 46.0 12/25/2022     Assessment and Plan Assessment & Plan Obesity She is struggling with weight management, having gained three pounds since her last visit one month ago. She is following a low-carb diet plan only 30% of the time and has not been exercising. Emotional stress and caregiving responsibilities are contributing to her difficulty in adhering to the plan. The focus is on maintaining current weight before resuming a more structured plan. - Focus on maintaining current weight - Provide additional low-carb recipes, including a lemon cheesecake fluff recipe - Encourage hydration, especially in the heat - Schedule follow-up appointment for August 11th and another in September  Caregiver Fatigue and Grieving She is experiencing caregiver fatigue due to her responsibilities in caring for her mother and the recent loss of a family pet. This has contributed to her emotional stress and difficulty in managing her own health. She is considering assisted living options for her mother and aunt, which may alleviate some of her caregiving burden. - Discuss potential assisted living options for her mother and aunt - No Changes in medications at this time, will continue to follow.         I have personally spent 30 minutes total time today in preparation, patient care, and documentation for this visit, including the following: review of clinical lab tests; review of medical history, review of Emotional Eating behaviors and nutritional counseling  She was informed of the  importance of frequent follow up visits to maximize her success with intensive lifestyle modifications for her multiple health conditions.    Louann Penton, MD

## 2024-01-25 ENCOUNTER — Ambulatory Visit (INDEPENDENT_AMBULATORY_CARE_PROVIDER_SITE_OTHER): Admitting: Family Medicine

## 2024-02-22 ENCOUNTER — Ambulatory Visit (INDEPENDENT_AMBULATORY_CARE_PROVIDER_SITE_OTHER): Admitting: Family Medicine

## 2024-02-22 ENCOUNTER — Encounter (INDEPENDENT_AMBULATORY_CARE_PROVIDER_SITE_OTHER): Payer: Self-pay | Admitting: Family Medicine

## 2024-02-22 VITALS — BP 101/67 | HR 86 | Temp 98.3°F | Ht 61.0 in | Wt 188.0 lb

## 2024-02-22 DIAGNOSIS — Z6835 Body mass index (BMI) 35.0-35.9, adult: Secondary | ICD-10-CM

## 2024-02-22 DIAGNOSIS — Z636 Dependent relative needing care at home: Secondary | ICD-10-CM | POA: Diagnosis not present

## 2024-02-22 DIAGNOSIS — E559 Vitamin D deficiency, unspecified: Secondary | ICD-10-CM

## 2024-02-22 DIAGNOSIS — E669 Obesity, unspecified: Secondary | ICD-10-CM | POA: Diagnosis not present

## 2024-02-22 DIAGNOSIS — G4700 Insomnia, unspecified: Secondary | ICD-10-CM | POA: Diagnosis not present

## 2024-02-22 DIAGNOSIS — Z6836 Body mass index (BMI) 36.0-36.9, adult: Secondary | ICD-10-CM

## 2024-02-22 MED ORDER — VITAMIN D (ERGOCALCIFEROL) 1.25 MG (50000 UNIT) PO CAPS: 50000.0000 [IU] | ORAL_CAPSULE | ORAL | 0 refills | Status: DC

## 2024-02-22 MED ORDER — TRAZODONE HCL 50 MG PO TABS: 25.0000 mg | ORAL_TABLET | Freq: Every evening | ORAL | 0 refills | Status: AC | PRN

## 2024-02-22 NOTE — Progress Notes (Signed)
 Office: [phone]  /  Fax: [phone]  WEIGHT SUMMARY AND BIOMETRICS  Anthropometric Measurements Height: 5' 1 (1.549 m) Weight: 188 lb (85.3 kg) BMI (Calculated): 35.54 Weight at Last Visit: 192 lb Weight Lost Since Last Visit: 4 lb Weight Gained Since Last Visit: 0 Starting Weight: 206 lb Total Weight Loss (lbs): 18 lb (8.165 kg) Peak Weight: 206 lb   Body Composition  Body Fat %: 45.7 % Fat Mass (lbs): 86 lbs Muscle Mass (lbs): 97.2 lbs Total Body Water (lbs): 73.6 lbs Visceral Fat Rating : 13   Other Clinical Data Fasting: no Labs: no Today's Visit #: 50 Starting Date: 07/14/19    Chief Complaint: OBESITY    History of Present Illness Alexandria Ward is a 58 year old female who presents for obesity treatment and progress assessment.  She is focusing on portion control and making smart food choices, achieving success about 30% of the time. She has managed to lose four pounds over the past two months since her last visit, despite not currently engaging in exercise.  She is being treated for insomnia with trazodone , taking 25 to 50 mg as needed. Additionally, she is on ergocalciferol , 50,000 IU per week, for vitamin D  deficiency. She requests refills for both medications.  She is experiencing significant stress related to her mother's care. Her mother has mild cognitive impairment, and she is concerned about her mother's safety, particularly regarding driving. She is attempting to place her mother in assisted living but faces resistance from her mother and legal challenges due to her mother's mental status being deemed sound by healthcare providers. She holds durable power of attorney for her mother and is struggling with the responsibilities and emotional toll of caregiving.      PHYSICAL EXAM:  Blood pressure 101/67, pulse 86, temperature 98.3 F (36.8 C), height 5' 1 (1.549 m), weight 188 lb (85.3 kg), SpO2 99%. Body mass index is 35.52  kg/m.  DIAGNOSTIC DATA REVIEWED:  BMET    Component Value Date/Time   NA 142 10/19/2023 0836   NA 141 06/16/2023 0922   K 4.1 10/19/2023 0836   CL 107 10/19/2023 0836   CO2 28 10/19/2023 0836   GLUCOSE 84 10/19/2023 0836   BUN 14 10/19/2023 0836   BUN 16 06/16/2023 0922   CREATININE 0.70 10/19/2023 0836   CALCIUM 10.0 10/19/2023 0836   GFRNONAA 68.67 04/24/2020 0000   GFRAA 83.10 04/24/2020 0000   Lab Results  Component Value Date   HGBA1C 5.8 10/19/2023   HGBA1C 5.8 (H) 07/14/2019   Lab Results  Component Value Date   INSULIN  4.6 06/16/2023   INSULIN  12.3 07/14/2019   Lab Results  Component Value Date   TSH 2.89 10/19/2023   CBC    Component Value Date/Time   WBC 5.4 10/19/2023 0836   RBC 4.09 10/19/2023 0836   HGB 13.0 10/19/2023 0836   HGB 13.2 12/30/2021 0906   HCT 39.2 10/19/2023 0836   HCT 39.6 12/30/2021 0906   PLT 294.0 10/19/2023 0836   PLT 271 12/30/2021 0906   MCV 95.8 10/19/2023 0836   MCV 96 12/30/2021 0906   MCH 32.0 12/30/2021 0906   MCHC 33.3 10/19/2023 0836   RDW 14.4 10/19/2023 0836   RDW 12.7 12/30/2021 0906   Iron Studies No results found for: IRON, TIBC, FERRITIN, IRONPCTSAT Lipid Panel     Component Value Date/Time   CHOL 227 (H) 10/19/2023 0836   CHOL 220 (H) 06/16/2023 0922   TRIG 71.0 10/19/2023  0836   HDL 53.80 10/19/2023 0836   HDL 54 06/16/2023 0922   CHOLHDL 4 10/19/2023 0836   VLDL 14.2 10/19/2023 0836   LDLCALC 159 (H) 10/19/2023 0836   LDLCALC 156 (H) 06/16/2023 0922   Hepatic Function Panel     Component Value Date/Time   PROT 6.7 10/19/2023 0836   PROT 6.4 06/16/2023 0922   ALBUMIN 4.2 10/19/2023 0836   ALBUMIN 4.3 06/16/2023 0922   AST 17 10/19/2023 0836   ALT 15 10/19/2023 0836   ALKPHOS 59 10/19/2023 0836   BILITOT 0.4 10/19/2023 0836   BILITOT 0.3 06/16/2023 0922      Component Value Date/Time   TSH 2.89 10/19/2023 0836   Nutritional Lab Results  Component Value Date   VD25OH 34.86  10/19/2023   VD25OH 67.4 06/16/2023   VD25OH 46.0 12/25/2022     Assessment and Plan Assessment & Plan Obesity Obesity, with recent weight loss of four pounds over two months, including reduction in fat and muscle mass. Concern for muscle loss due to lack of exercise and inadequate protein intake. Current success in portion control and smart food choices is about 30%. - Encourage increased protein intake - Encourage physical activity to maintain muscle mass - Schedule follow-up appointment for April 14, 2024 -Continue to work on [employer] plan   Insomnia Insomnia managed with trazodone , 25 to 50 mg as needed. - Refill trazodone  prescription -Sleep strategies discussed  Vitamin D  deficiency Vitamin D  deficiency treated with ergocalciferol , 50,000 IU per week. - Refill ergocalciferol  prescription  Caregiver stress Significant caregiver stress related to managing her mother's care, including concerns about cognitive impairment and driving safety. Considering legal and medical options for her mother's care needs. - Recommend contacting a geriatric social worker for resources and support - Suggest consulting a legal service for advice on durable power of attorney responsibilities - Encourage communication with the neurologist for resources related to her mother's cognitive impairment     [name] was informed of the importance of frequent follow up visits to maximize her success with intensive lifestyle modifications for her obesity and obesity related health conditions as recommended by USPSTF and CMS guidelines   Louann Penton, MD

## 2024-03-02 ENCOUNTER — Encounter (INDEPENDENT_AMBULATORY_CARE_PROVIDER_SITE_OTHER): Payer: Self-pay | Admitting: Family Medicine

## 2024-03-30 ENCOUNTER — Telehealth: Payer: Self-pay | Admitting: Neurology

## 2024-03-30 NOTE — Telephone Encounter (Signed)
 Spoke to patient sent PA Ajovy  to Prior auth team this afternoon

## 2024-03-30 NOTE — Telephone Encounter (Signed)
 Pt called and is wanting to speak to the Rn regarding the update on getting her Ajovy  auth. Please advise.

## 2024-03-31 ENCOUNTER — Other Ambulatory Visit (HOSPITAL_COMMUNITY): Payer: Self-pay

## 2024-03-31 NOTE — Telephone Encounter (Signed)
 Pharmacy Patient Advocate Encounter  Received notification from [facility] that Prior Authorization for Ajovy  has been APPROVED from 03/31/2024 to 03/31/2025. Ran test claim, Copay is $24.98. This test claim was processed through [facility]- copay amounts may vary at other pharmacies due to pharmacy/plan contracts, or as the patient moves through the different stages of their insurance plan.   PA #/Case ID/Reference #: [id number]  Sent PT A MC to them know about approval.

## 2024-04-14 ENCOUNTER — Ambulatory Visit (INDEPENDENT_AMBULATORY_CARE_PROVIDER_SITE_OTHER): Admitting: Family Medicine

## 2024-04-15 ENCOUNTER — Encounter: Payer: Self-pay | Admitting: Family Medicine

## 2024-04-15 ENCOUNTER — Ambulatory Visit: Admitting: Family Medicine

## 2024-04-15 VITALS — BP 100/80 | HR 101 | Temp 98.1°F | Ht 61.0 in | Wt 194.0 lb

## 2024-04-15 DIAGNOSIS — R062 Wheezing: Secondary | ICD-10-CM

## 2024-04-15 MED ORDER — PREDNISONE 20 MG PO TABS: ORAL_TABLET | ORAL | 0 refills | Status: DC

## 2024-04-15 MED ORDER — ALBUTEROL SULFATE HFA 108 (90 BASE) MCG/ACT IN AERS: 2.0000 | INHALATION_SPRAY | Freq: Four times a day (QID) | RESPIRATORY_TRACT | 0 refills | Status: DC | PRN

## 2024-04-15 NOTE — Progress Notes (Signed)
 Established Patient Office Visit  Subjective   Patient ID: Alexandria Ward, female    DOB: [birth year]  Age: 58 y.o. MRN: [id number]  Chief Complaint  Patient presents with   Cough    HPI   [name] was seen as a work in with almost 3-week history of cough and some congestion.  She feels like she is having some wheezing especially at night.  She has had some asthma issues in the past.  She has eczema followed by dermatology specialist.  She tried multiple things in the past few weeks including Nettie pot, Allegra, Mucinex D, Flonase without much relief.  No fever.  Non-smoker.  Past Medical History:  Diagnosis Date   Anemia    Asthma    Back pain    Constipation    Depression    Dyspnea    Fatigue    Fluid retention    Hyperlipidemia    Hypothyroidism    IBS (irritable bowel syndrome)    Joint pain    Lower extremity edema    Migraines    Palpitations    Shortness of breath    Tachycardia    Thyroid  disease    Vitamin B 12 deficiency    Vitamin D  deficiency    Past Surgical History:  Procedure Laterality Date   APPENDECTOMY     CARPAL TUNNEL RELEASE Bilateral    2024   endometrial ablasion      reports that she has never smoked. She has never used smokeless tobacco. She reports that she does not currently use alcohol. She reports that she does not use drugs. family history includes Arthritis in her mother; Depression in her mother; Heart attack in her father; Hyperlipidemia in her mother; Migraines in her mother; Neuropathy in her mother; Restless legs syndrome in her mother. Allergies  Allergen Reactions   Betadine [Povidone Iodine]    Azithromycin Rash   Ciprofloxacin Rash   Penicillins Rash    Cant remember the reaction or the severity of it   Tape Rash    Review of Systems  Constitutional:  Negative for chills and fever.  HENT:  Negative for sinus pain.   Respiratory:  Positive for cough and wheezing. Negative for hemoptysis and sputum production.    Cardiovascular:  Negative for chest pain.      Objective:     BP 100/80   Pulse (!) 101   Temp 98.1 F (36.7 C) (Oral)   Ht 5' 1 (1.549 m)   Wt 194 lb (88 kg)   SpO2 98%   BMI 36.66 kg/m  BP Readings from Last 3 Encounters:  04/15/24 100/80  02/22/24 101/67  12/28/23 104/69   Wt Readings from Last 3 Encounters:  04/15/24 194 lb (88 kg)  02/22/24 188 lb (85.3 kg)  12/28/23 192 lb (87.1 kg)      Physical Exam Vitals reviewed.  Constitutional:      General: She is not in acute distress.    Appearance: She is not ill-appearing.  HENT:     Right Ear: Tympanic membrane normal.     Left Ear: Tympanic membrane normal.  Pulmonary:     Comments: She has some diffuse expiratory wheezes.  No rales.  No respiratory distress.  O2 sat 98% room air Neurological:     Mental Status: She is alert.      No results found for any visits on 04/15/24.    The 10-year ASCVD risk score (Arnett DK, et al., 2019) is: 2.1%  Assessment & Plan:   Wheezing associated with probable recent viral respiratory infection.  She is in no respiratory distress.  Has had symptoms now for several weeks.  Recommend prednisone 40 mg daily for 5 days.  Refill albuterol  to use as needed.  Follow-up promptly for any fever or if cough not resolving over the next week or so  Wolm Scarlet, MD

## 2024-04-28 ENCOUNTER — Telehealth: Payer: Self-pay | Admitting: *Deleted

## 2024-04-28 NOTE — Telephone Encounter (Signed)
 I called and spoke to the patient.  She stated it wasn't for her but for her mother.  I explained that her mother will need an office visit with Dr Theophilus.  The daughter wants her mother to follow up with Dr Micheal.  I called the patient and schedule an office visit with Dr Theophilus.

## 2024-04-28 NOTE — Telephone Encounter (Signed)
 Copied from CRM #[id number]. Topic: Clinical - Medication Question >> Apr 28, 2024 12:00 PM Aisha D wrote: Reason for CRM: Pt stated that she has a runny nose and is sweating really bad. Pt would like to request a prescription from Dr.Hernandez and would like a callback with an update.

## 2024-05-05 ENCOUNTER — Other Ambulatory Visit (INDEPENDENT_AMBULATORY_CARE_PROVIDER_SITE_OTHER): Payer: Self-pay | Admitting: Family Medicine

## 2024-05-05 DIAGNOSIS — E559 Vitamin D deficiency, unspecified: Secondary | ICD-10-CM

## 2024-05-15 ENCOUNTER — Other Ambulatory Visit: Payer: Self-pay | Admitting: Internal Medicine

## 2024-05-15 DIAGNOSIS — K219 Gastro-esophageal reflux disease without esophagitis: Secondary | ICD-10-CM

## 2024-05-25 ENCOUNTER — Ambulatory Visit: Payer: 59 | Admitting: Neurology

## 2024-05-25 ENCOUNTER — Ambulatory Visit (INDEPENDENT_AMBULATORY_CARE_PROVIDER_SITE_OTHER): Payer: Self-pay | Admitting: Family Medicine

## 2024-05-25 ENCOUNTER — Ambulatory Visit: Admitting: Neurology

## 2024-05-27 ENCOUNTER — Encounter: Payer: Self-pay | Admitting: Family Medicine

## 2024-05-27 ENCOUNTER — Ambulatory Visit: Admitting: Family Medicine

## 2024-05-27 VITALS — BP 94/60 | HR 100 | Temp 98.2°F | Wt 193.0 lb

## 2024-05-27 DIAGNOSIS — J069 Acute upper respiratory infection, unspecified: Secondary | ICD-10-CM | POA: Diagnosis not present

## 2024-05-27 MED ORDER — PREDNISONE 20 MG PO TABS: ORAL_TABLET | ORAL | 0 refills | Status: DC

## 2024-05-27 MED ORDER — BENZONATATE 100 MG PO CAPS: ORAL_CAPSULE | ORAL | 0 refills | Status: DC

## 2024-05-27 MED ORDER — ALBUTEROL SULFATE HFA 108 (90 BASE) MCG/ACT IN AERS: 2.0000 | INHALATION_SPRAY | Freq: Four times a day (QID) | RESPIRATORY_TRACT | 0 refills | Status: AC | PRN

## 2024-05-27 NOTE — Progress Notes (Signed)
 Established Patient Office Visit  Subjective   Patient ID: Alexandria Ward, female    DOB: 05/03/1966  Age: 58 y.o. MRN: [id number]  Chief Complaint  Patient presents with   Nasal Congestion   Cough   Hoarse         HPI   Alexandria Ward is seen with 4-day history of nasal congestion, mostly dry cough, hoarseness.  No fever.  No body aches.  Has had some general malaise.  No specific sick contacts.  Has had some reactive airway changes on exam previously.  She had flare up in October and eventually improved following prednisone .  She still has albuterol  inhaler but requests refill.  Non-smoker  Past Medical History:  Diagnosis Date   Anemia    Asthma    Back pain    Constipation    Depression    Dyspnea    Fatigue    Fluid retention    Hyperlipidemia    Hypothyroidism    IBS (irritable bowel syndrome)    Joint pain    Lower extremity edema    Migraines    Palpitations    Shortness of breath    Tachycardia    Thyroid  disease    Vitamin B 12 deficiency    Vitamin D  deficiency    Past Surgical History:  Procedure Laterality Date   APPENDECTOMY     CARPAL TUNNEL RELEASE Bilateral    2024   endometrial ablasion      reports that she has never smoked. She has never used smokeless tobacco. She reports that she does not currently use alcohol. She reports that she does not use drugs. family history includes Arthritis in her mother; Depression in her mother; Heart attack in her father; Hyperlipidemia in her mother; Migraines in her mother; Neuropathy in her mother; Restless legs syndrome in her mother. Allergies[1]  Review of Systems  Constitutional:  Negative for chills and fever.  HENT:  Positive for congestion. Negative for sinus pain and sore throat.   Respiratory:  Positive for cough.       Objective:     BP 94/60   Pulse 100   Temp 98.2 F (36.8 C) (Oral)   Wt 193 lb (87.5 kg)   SpO2 95%   BMI 36.47 kg/m  BP Readings from Last 3 Encounters:  05/27/24  94/60  04/15/24 100/80  02/22/24 101/67   Wt Readings from Last 3 Encounters:  05/27/24 193 lb (87.5 kg)  04/15/24 194 lb (88 kg)  02/22/24 188 lb (85.3 kg)      Physical Exam Vitals reviewed.  Constitutional:      General: She is not in acute distress.    Appearance: She is not ill-appearing.  HENT:     Right Ear: Tympanic membrane normal.     Left Ear: Tympanic membrane normal.  Cardiovascular:     Rate and Rhythm: Normal rate and regular rhythm.  Pulmonary:     Comments: Few faint expiratory wheezes.  No rales.  Pulse oximetry 95% room air Musculoskeletal:     Cervical back: Neck supple.  Lymphadenopathy:     Cervical: No cervical adenopathy.  Neurological:     Mental Status: She is alert.      No results found for any visits on 05/27/24.    The 10-year ASCVD risk score (Arnett DK, et al., 2019) is: 2%    Assessment & Plan:   Probable viral URI with cough.  She does have some mild reactive airway component on  exam.  Refill albuterol  for as needed use.  No indication for antibiotics at this time.  We also rewrote prescription for prednisone  20 mg 2 tablets daily for 5 days for recurrent wheezing  Wolm Scarlet, MD     [1]  Allergies Allergen Reactions   Betadine [Povidone Iodine]    Azithromycin Rash   Ciprofloxacin Rash   Penicillins Rash    Cant remember the reaction or the severity of it   Tape Rash

## 2024-05-30 MED ORDER — HYDROCODONE BIT-HOMATROP MBR 5-1.5 MG/5ML PO SOLN: 5.0000 mL | Freq: Four times a day (QID) | ORAL | 0 refills | Status: DC | PRN

## 2024-05-30 NOTE — Telephone Encounter (Signed)
 Sent in limited Hycodan cough syrup.  Wolm LELON Scarlet MD [facility]

## 2024-05-30 NOTE — Addendum Note (Signed)
 Addended by: MICHEAL WOLM ORN on: 05/30/2024 04:55 PM   Modules accepted: Orders

## 2024-06-27 ENCOUNTER — Ambulatory Visit (INDEPENDENT_AMBULATORY_CARE_PROVIDER_SITE_OTHER): Admitting: Family Medicine

## 2024-06-27 ENCOUNTER — Other Ambulatory Visit: Payer: Self-pay | Admitting: Internal Medicine

## 2024-06-27 ENCOUNTER — Encounter (INDEPENDENT_AMBULATORY_CARE_PROVIDER_SITE_OTHER): Payer: Self-pay | Admitting: Family Medicine

## 2024-06-27 VITALS — BP 109/71 | HR 98 | Temp 98.9°F | Ht 61.0 in | Wt 191.0 lb

## 2024-06-27 DIAGNOSIS — Z636 Dependent relative needing care at home: Secondary | ICD-10-CM

## 2024-06-27 DIAGNOSIS — E669 Obesity, unspecified: Secondary | ICD-10-CM | POA: Diagnosis not present

## 2024-06-27 DIAGNOSIS — Z6838 Body mass index (BMI) 38.0-38.9, adult: Secondary | ICD-10-CM

## 2024-06-27 DIAGNOSIS — G4700 Insomnia, unspecified: Secondary | ICD-10-CM | POA: Diagnosis not present

## 2024-06-27 DIAGNOSIS — Z6836 Body mass index (BMI) 36.0-36.9, adult: Secondary | ICD-10-CM

## 2024-06-27 DIAGNOSIS — E559 Vitamin D deficiency, unspecified: Secondary | ICD-10-CM

## 2024-06-27 DIAGNOSIS — R7303 Prediabetes: Secondary | ICD-10-CM | POA: Diagnosis not present

## 2024-06-27 DIAGNOSIS — K219 Gastro-esophageal reflux disease without esophagitis: Secondary | ICD-10-CM

## 2024-06-27 MED ORDER — VITAMIN D (ERGOCALCIFEROL) 1.25 MG (50000 UNIT) PO CAPS: 50000.0000 [IU] | ORAL_CAPSULE | ORAL | 0 refills | Status: AC

## 2024-06-27 NOTE — Addendum Note (Signed)
 Addended by: LAFE BAKER CROME on: 06/27/2024 04:35 PM   Modules accepted: Orders

## 2024-06-27 NOTE — Progress Notes (Signed)
 "  Office: [phone]  /  Fax: [phone]  WEIGHT SUMMARY AND BIOMETRICS  Anthropometric Measurements Height: 5' 1 (1.549 m) Weight: 191 lb (86.6 kg) BMI (Calculated): 36.11 Weight at Last Visit: 188 lb Weight Lost Since Last Visit: 0 Weight Gained Since Last Visit: 3 lb Starting Weight: 206 lb Total Weight Loss (lbs): 15 lb (6.804 kg) Peak Weight: 206 lb   Body Composition  Body Fat %: 46.9 % Fat Mass (lbs): 89.8 lbs Muscle Mass (lbs): 96.6 lbs Total Body Water (lbs): 75.6 lbs Visceral Fat Rating : 14   Other Clinical Data Fasting: no Labs: no Today's Visit #: 51 Starting Date: 07/14/19    Chief Complaint: OBESITY   Discussed the use of AI scribe software for clinical note transcription with the patient, who gave verbal consent to proceed.  History of Present Illness Alexandria Ward is a 59 year old female with obesity and vitamin D  deficiency who presents for obesity treatment plan assessment and progress evaluation.  She is following a portion Emergency Planning/management Officer eating plan, which she finds easier to follow than structured plans, but she has not been adhering to it. She struggles to meet her protein intake and is trying to eat more fruits and vegetables. She is not getting seven to nine hours of sleep per night and is not exercising. She has gained three pounds in the last four months since her last visit.  She is being treated for vitamin D  deficiency with prescription ergocalciferol  50,000 IU per week and requests a refill. She was previously prescribed trazodone  25 to 50 mg for insomnia, which she finds helpful but struggles with taking it consistently due to her sleep schedule.  She experienced viral infections in October and December, with coughing and wheezing lasting about four weeks each time. She was prescribed prednisone  four times last year for joint issues, skin conditions, and viral infections. Prednisone  has caused increased hunger and  jitteriness in the mornings.  She underwent blood work at [facility], which did not include an A1c test. She received a food sensitivities list indicating sensitivities to cheese, dairy, and eggs, which she finds difficult to eliminate from her diet. She has been trying to cut back on cheese and misses yogurt. She often feels hungry and attributes jitteriness to prednisone  use.  She has been taking additional vitamins, including B vitamins, zinc, and fish oil, as recommended by her integrative medicine provider. She plans to receive a vitamin C infusion in March. She has also signed up for a butcher box to ensure a supply of quality meat and is reorganizing her kitchen to accommodate healthier eating habits.  She experiences heartburn, which has worsened despite taking omeprazole  daily. She uses Tums frequently and is considering additional medication for relief.  She is experiencing caregiver fatigue due to her mother's declining memory and independence. Her mother has had minor accidents and gets lost while driving, which is a concern for her. She is trying to manage her mother's care while dealing with her own health issues.      PHYSICAL EXAM:  Blood pressure 109/71, pulse 98, temperature 98.9 F (37.2 C), height 5' 1 (1.549 m), weight 191 lb (86.6 kg), SpO2 94%. Body mass index is 36.09 kg/m.  DIAGNOSTIC DATA REVIEWED:  BMET    Component Value Date/Time   NA 142 10/19/2023 0836   NA 141 06/16/2023 0922   K 4.1 10/19/2023 0836   CL 107 10/19/2023 0836   CO2 28 10/19/2023 0836  GLUCOSE 84 10/19/2023 0836   BUN 14 10/19/2023 0836   BUN 16 06/16/2023 0922   CREATININE 0.70 10/19/2023 0836   CALCIUM 10.0 10/19/2023 0836   GFRNONAA 68.67 04/24/2020 0000   GFRAA 83.10 04/24/2020 0000   Lab Results  Component Value Date   HGBA1C 5.8 10/19/2023   HGBA1C 5.8 (H) 07/14/2019   Lab Results  Component Value Date   INSULIN  4.6 06/16/2023   INSULIN  12.3 07/14/2019    Lab Results  Component Value Date   TSH 2.89 10/19/2023   CBC    Component Value Date/Time   WBC 5.4 10/19/2023 0836   RBC 4.09 10/19/2023 0836   HGB 13.0 10/19/2023 0836   HGB 13.2 12/30/2021 0906   HCT 39.2 10/19/2023 0836   HCT 39.6 12/30/2021 0906   PLT 294.0 10/19/2023 0836   PLT 271 12/30/2021 0906   MCV 95.8 10/19/2023 0836   MCV 96 12/30/2021 0906   MCH 32.0 12/30/2021 0906   MCHC 33.3 10/19/2023 0836   RDW 14.4 10/19/2023 0836   RDW 12.7 12/30/2021 0906   Iron Studies No results found for: IRON, TIBC, FERRITIN, IRONPCTSAT Lipid Panel     Component Value Date/Time   CHOL 227 (H) 10/19/2023 0836   CHOL 220 (H) 06/16/2023 0922   TRIG 71.0 10/19/2023 0836   HDL 53.80 10/19/2023 0836   HDL 54 06/16/2023 0922   CHOLHDL 4 10/19/2023 0836   VLDL 14.2 10/19/2023 0836   LDLCALC 159 (H) 10/19/2023 0836   LDLCALC 156 (H) 06/16/2023 0922   Hepatic Function Panel     Component Value Date/Time   PROT 6.7 10/19/2023 0836   PROT 6.4 06/16/2023 0922   ALBUMIN 4.2 10/19/2023 0836   ALBUMIN 4.3 06/16/2023 0922   AST 17 10/19/2023 0836   ALT 15 10/19/2023 0836   ALKPHOS 59 10/19/2023 0836   BILITOT 0.4 10/19/2023 0836   BILITOT 0.3 06/16/2023 0922      Component Value Date/Time   TSH 2.89 10/19/2023 0836   Nutritional Lab Results  Component Value Date   VD25OH 34.86 10/19/2023   VD25OH 67.4 06/16/2023   VD25OH 46.0 12/25/2022     Assessment and Plan Assessment & Plan Obesity Management is ongoing with a focus on dietary changes. She has been prescribed a portion control Smart Choices eating plan but has not been following it consistently. She struggles with meeting protein intake and is trying to increase fruits and vegetables. She has gained three pounds in the last four months. She is exploring integrative medicine options for a holistic approach to weight management and has received a food sensitivity report that is challenging to follow. -  Continue portion control Smart Choices eating plan - Encouraged increased protein intake - Follow up with integrative medicine group for nutritional guidance  Vitamin D  deficiency Managed with prescription ergocalciferol  50,000 IU weekly. She requests a refill and has been inconsistent with daily natural vitamin D  supplementation due to illness. - Refilled ergocalciferol  50,000 IU weekly - Check labs and follow  Insomnia Managed with trazodone  25-50 mg. She reports it helps but struggles with sleep routine due to stress and caregiving responsibilities. She also takes Xyzal for skin itching, which aids sleep. - Continue trazodone  25-50 mg as needed - Encouraged establishing a consistent sleep routine  Prediabetes She has not had an A1c test recently and is interested in assessing her current status. She is aware of the need for dietary management and is exploring integrative medicine options for nutritional guidance. -  Ordered A1c and insulin  tests - Encouraged dietary management and follow-up with integrative medicine group  Gastroesophageal reflux disease Managed with omeprazole , but symptoms have worsened, possibly due to stress. She experiences heartburn and is considering additional management options. - Continue omeprazole  daily - Add famotidine  as needed for breakthrough symptoms  Caregiver stress Significant due to her mother's declining memory and independence. She is experiencing stress related to caregiving responsibilities and is seeking ways to manage this stress. - She was offered support and encouragement, and self care what emphasized.       Patients who are on anti-obesity medications are counseled on the importance of maintaining healthy lifestyle habits, including balanced nutrition, regular physical activity, and behavioral modifications,  Medication is an adjunct to, not a replacement for, lifestyle changes and that the long-term success and weight maintenance  depend on continued adherence to these strategies.   Bryttney was informed of the importance of frequent follow up visits to maximize her success with intensive lifestyle modifications for her obesity and obesity related health conditions as recommended by USPSTF and CMS guidelines  Louann Penton, MD   "

## 2024-06-29 ENCOUNTER — Encounter: Payer: Self-pay | Admitting: Internal Medicine

## 2024-06-29 LAB — CMP14+EGFR
ALT: 15 IU/L (ref 0–32)
AST: 18 IU/L (ref 0–40)
Albumin: 4.2 g/dL (ref 3.8–4.9)
Alkaline Phosphatase: 77 IU/L (ref 49–135)
BUN/Creatinine Ratio: 30 — ABNORMAL HIGH (ref 12–28)
BUN: 21 mg/dL (ref 8–27)
Bilirubin Total: 0.4 mg/dL (ref 0.0–1.2)
CO2: 20 mmol/L (ref 20–29)
Calcium: 10.1 mg/dL (ref 8.7–10.3)
Chloride: 108 mmol/L — ABNORMAL HIGH (ref 96–106)
Creatinine, Ser: 0.7 mg/dL (ref 0.57–1.00)
Globulin, Total: 2.2 g/dL (ref 1.5–4.5)
Glucose: 69 mg/dL — ABNORMAL LOW (ref 70–99)
Potassium: 4.6 mmol/L (ref 3.5–5.2)
Sodium: 142 mmol/L (ref 134–144)
Total Protein: 6.4 g/dL (ref 6.0–8.5)
eGFR: 99 mL/min/1.73

## 2024-06-29 LAB — HEMOGLOBIN A1C
Est. average glucose Bld gHb Est-mCnc: 114 mg/dL
Hgb A1c MFr Bld: 5.6 % (ref 4.8–5.6)

## 2024-06-29 LAB — VITAMIN D 25 HYDROXY (VIT D DEFICIENCY, FRACTURES): Vit D, 25-Hydroxy: 41.7 ng/mL (ref 30.0–100.0)

## 2024-06-29 LAB — INSULIN, RANDOM: INSULIN: 5.7 u[IU]/mL (ref 2.6–24.9)

## 2024-07-06 ENCOUNTER — Other Ambulatory Visit (INDEPENDENT_AMBULATORY_CARE_PROVIDER_SITE_OTHER): Payer: Self-pay | Admitting: Family Medicine

## 2024-07-06 DIAGNOSIS — E559 Vitamin D deficiency, unspecified: Secondary | ICD-10-CM

## 2024-07-07 ENCOUNTER — Ambulatory Visit (INDEPENDENT_AMBULATORY_CARE_PROVIDER_SITE_OTHER): Admitting: Family Medicine

## 2024-07-07 ENCOUNTER — Encounter: Payer: Self-pay | Admitting: Family Medicine

## 2024-07-07 VITALS — BP 100/62 | HR 90 | Temp 97.7°F | Ht 61.0 in | Wt 197.8 lb

## 2024-07-07 DIAGNOSIS — F418 Other specified anxiety disorders: Secondary | ICD-10-CM | POA: Diagnosis not present

## 2024-07-07 DIAGNOSIS — L282 Other prurigo: Secondary | ICD-10-CM | POA: Diagnosis not present

## 2024-07-07 MED ORDER — HYDROXYZINE PAMOATE 25 MG PO CAPS: 25.0000 mg | ORAL_CAPSULE | Freq: Three times a day (TID) | ORAL | 2 refills | Status: AC | PRN

## 2024-07-07 MED ORDER — PREDNISONE 20 MG PO TABS: 40.0000 mg | ORAL_TABLET | Freq: Every day | ORAL | 0 refills | Status: AC

## 2024-07-07 NOTE — Progress Notes (Unsigned)
 "  Acute Office Visit  Subjective:     Patient ID: Alexandria Ward, female    DOB: 04-15-1966, 59 y.o.   MRN: [id number]  Chief Complaint  Patient presents with   Rash    Patient complains of itchy rash bilateral arms  x1 year, itching over entire body noted since last night    HPI Discussed the use of AI scribe software for clinical note transcription with the patient, who gave verbal consent to proceed.  History of Present Illness   Alexandria Ward is a 59 year old female with eczema who presents with worsening skin symptoms and itching.  She reports recent worsening of eczema with severe generalized itching and increased redness after using several topical products. An over-the-counter cream caused burning and was stopped. She is sensitive to propylene glycol and noticed it in the ointment she was supposed to receive initially.  After applying a new halobetasol ointment to her hands and arms last night, she developed increased welts and intense itching over her whole body, including previously unaffected areas. She describes the skin as more red and raised. She has benefited in the past from a steroid injection and oral prednisone  for similar flares.  Her skin problems started in November 2024 after a fall that led to prednisone  for her knees. She has since had multiple prednisone  courses for skin and respiratory issues with lung inflammation, cough, and wheeze. She has tried reducing handwashing and using Crisco and an Aveeno product, which feels soothing.  Allergy testing showed sensitivity to dairy and eggs, but she has not eliminated them due to difficulty with diet changes. She reports significant stress from caregiving for her elderly mother and her own health problems, which she feels worsen her skin.  She has anxiety and has used Xanax and Lexapro  in the past. She currently feels overwhelmed by caregiving demands and her health and is open to discussing anxiety treatment  options today.       Review of Systems  All other systems reviewed and are negative.       Objective:    BP 100/62   Pulse 90   Temp 97.7 F (36.5 C) (Oral)   Ht 5' 1 (1.549 m)   Wt 197 lb 12.8 oz (89.7 kg)   BMI 37.37 kg/m    Physical Exam Vitals reviewed.  Constitutional:      Appearance: Normal appearance. She is obese.  Cardiovascular:     Rate and Rhythm: Normal rate and regular rhythm.     Heart sounds: Normal heart sounds. No murmur heard. Skin:    Findings: Rash (extensive rash that is erythematous, raised on the hands and arms BL, there is superficial cracking of the skin on the right dorsal hand. on the rest of her body there are smaller punctate macules spread thorughout the torso and back) present.  Neurological:     Mental Status: She is alert.     No results found for any visits on 07/07/24.      Assessment & Plan:   Problem List Items Addressed This Visit   None Visit Diagnoses       Pruritic rash    -  Primary   Relevant Medications   predniSONE  (DELTASONE ) 20 MG tablet   hydrOXYzine  (VISTARIL ) 25 MG capsule      Assessment and Plan    Pruritic dermatitis Chronic pruritic dermatitis with exacerbation, primarily affecting hands and arms, with widespread itching. Recent exacerbation due to incorrect topical treatment containing  propylene glycol, which she is sensitive to. Previous treatments included prednisone  and steroid injections, providing temporary relief. Current symptoms include severe itching and redness, with a history of similar episodes since November 2024. No biopsy performed to confirm diagnosis. Allergy testing indicated sensitivity to dairy and eggs, but dietary changes have been challenging. - Prescribed prednisone  40 mg once daily for 4 days to manage acute exacerbation. - Prescribed hydroxyzine  25 mg as needed for itching and anxiety, with caution regarding sedation. - Encouraged use of gloves during activities that may  irritate skin and to continue using recommended moisturizers like Crisco and Aveeno.  Situational anxiety Related to multiple stressors including caregiving responsibilities and skin condition. No current panic attacks, but significant life stressors contributing to anxiety. Previous use of Xanax noted, but preference to avoid additional medications. Discussion of potential treatment options including SSRIs and buspirone, but preference for hydroxyzine  for acute management. - Prescribed hydroxyzine  25 mg as needed for acute anxiety episodes, with caution regarding sedation. - Advised follow-up with primary care physician for long-term management of anxiety and potential initiation of SSRIs or buspirone if needed.       Meds ordered this encounter  Medications   predniSONE  (DELTASONE ) 20 MG tablet    Sig: Take 2 tablets (40 mg total) by mouth daily with breakfast for 4 days.    Dispense:  8 tablet    Refill:  0   hydrOXYzine  (VISTARIL ) 25 MG capsule    Sig: Take 1-2 capsules (25-50 mg total) by mouth every 8 (eight) hours as needed for itching or anxiety.    Dispense:  30 capsule    Refill:  2    No follow-ups on file.  Heron CHRISTELLA Sharper, MD   "

## 2024-07-13 ENCOUNTER — Telehealth: Payer: Self-pay | Admitting: Neurology

## 2024-07-13 NOTE — Telephone Encounter (Signed)
 LVM & mychart message informing pt of reschedule needed. MD OUT

## 2024-07-27 ENCOUNTER — Ambulatory Visit (INDEPENDENT_AMBULATORY_CARE_PROVIDER_SITE_OTHER): Admitting: Family Medicine

## 2024-07-28 ENCOUNTER — Ambulatory Visit (INDEPENDENT_AMBULATORY_CARE_PROVIDER_SITE_OTHER): Admitting: Family Medicine

## 2024-09-01 ENCOUNTER — Ambulatory Visit: Admitting: Neurology

## 2024-12-19 ENCOUNTER — Ambulatory Visit: Admitting: Neurology
